# Patient Record
Sex: Female | Born: 1980
Health system: Southern US, Community
[De-identification: ages and names within clinical notes are randomized; demographics above are authoritative.]

## PROBLEM LIST (undated history)

## (undated) DIAGNOSIS — T148XXA Other injury of unspecified body region, initial encounter: Secondary | ICD-10-CM

## (undated) DIAGNOSIS — F1010 Alcohol abuse, uncomplicated: Secondary | ICD-10-CM

## (undated) DIAGNOSIS — G40909 Epilepsy, unspecified, not intractable, without status epilepticus: Secondary | ICD-10-CM

## (undated) DIAGNOSIS — F419 Anxiety disorder, unspecified: Secondary | ICD-10-CM

## (undated) HISTORY — DX: Alcohol abuse, uncomplicated: F10.10

## (undated) HISTORY — PX: CERVICAL CONE BIOPSY: SUR198

## (undated) HISTORY — DX: Other injury of unspecified body region, initial encounter: T14.8XXA

---
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
## 2002-07-28 ENCOUNTER — Emergency Department (HOSPITAL_COMMUNITY): Admission: EM | Admit: 2002-07-28 | Discharge: 2002-07-28 | Payer: Self-pay | Admitting: Emergency Medicine

## 2002-08-31 ENCOUNTER — Encounter: Admission: RE | Admit: 2002-08-31 | Discharge: 2002-08-31 | Payer: Self-pay | Admitting: Chiropractic Medicine

## 2002-08-31 ENCOUNTER — Encounter: Payer: Self-pay | Admitting: Chiropractic Medicine

## 2003-04-16 HISTORY — PX: GASTRIC BYPASS: SHX52

## 2011-05-15 LAB — HEPATIC FUNCTION PANEL
ALT: 41 U/L — AB (ref 7–35)
AST: 34 U/L (ref 13–35)

## 2011-05-15 LAB — BASIC METABOLIC PANEL
Creatinine: 0.6 mg/dL (ref 0.5–1.1)
Glucose: 80 mg/dL

## 2011-05-15 LAB — LIPID PANEL
Cholesterol: 158 mg/dL (ref 0–200)
HDL: 76 mg/dL — AB (ref 35–70)
LDL Cholesterol: 72 mg/dL
Triglycerides: 50 mg/dL (ref 40–160)

## 2011-05-15 LAB — TSH: TSH: 1.58 u[IU]/mL (ref <=5.90)

## 2011-10-21 DIAGNOSIS — I8390 Asymptomatic varicose veins of unspecified lower extremity: Secondary | ICD-10-CM | POA: Insufficient documentation

## 2012-02-28 LAB — IRON AND TIBC: Iron: 420

## 2012-02-28 LAB — IRON

## 2012-07-12 ENCOUNTER — Emergency Department
Admission: EM | Admit: 2012-07-12 | Discharge: 2012-07-12 | Disposition: A | Discharge status: Home / Self Care | Payer: Managed Care, Other (non HMO) | Source: Home / Self Care | Attending: Family Medicine | Admitting: Family Medicine

## 2012-07-12 DIAGNOSIS — J069 Acute upper respiratory infection, unspecified: Secondary | ICD-10-CM

## 2012-07-12 DIAGNOSIS — J029 Acute pharyngitis, unspecified: Secondary | ICD-10-CM

## 2012-07-12 DIAGNOSIS — M940 Chondrocostal junction syndrome [Tietze]: Secondary | ICD-10-CM

## 2012-07-12 HISTORY — DX: Anxiety disorder, unspecified: F41.9

## 2012-07-12 LAB — POCT RAPID STREP A (OFFICE): Rapid Strep A Screen: NEGATIVE

## 2012-07-12 MED ORDER — DOXYCYCLINE HYCLATE 100 MG PO CAPS: 100.0000 mg | ORAL_CAPSULE | Freq: Two times a day (BID) | ORAL | Status: DC

## 2012-07-12 MED ORDER — BENZONATATE 200 MG PO CAPS: 200.0000 mg | ORAL_CAPSULE | Freq: Every day | ORAL | Status: DC

## 2012-07-12 NOTE — ED Notes (Signed)
SUBJECTIVE:  Deanna Keller is a 32 y.o. female who complains of sore throat, swollen glands, cough and cough described as productive for 3 days. The cough started off as a dry cough and became productive yesterday. She denies a history of dizziness, nausea and shortness of breath and denies a history of asthma. Patient admits to smoke cigarettes half a pack for 5 years. She has not smoked for the last 3 days.

## 2012-07-12 NOTE — ED Provider Notes (Signed)
History     CSN: [id number]  Arrival date & time 07/12/12  1302   First MD Initiated Contact with Patient 07/12/12 1341      Chief Complaint  Patient presents with  . Cough    x 3 days  . Generalized Body Aches    x 3 days  . Sore Throat    x 3 days       HPI Comments: Shaundrea complains of onset of URI symptoms three days ago with fatigue and myalgias.  Two days ago she developed chills/sweats and fever to 99.5, and sore throat.  Last night she developed a nonproductive cough that has now become productive.  She has felt tightness in her anterior chest. She is a smoker.   The history is provided by the patient.    Past Medical History  Diagnosis Date  . Anxiety     History reviewed. No pertinent past surgical history.  History reviewed. No pertinent family history.  History  Substance Use Topics  . Smoking status: Current Every Day Smoker -- 0.50 packs/day for 5 years    Types: Cigarettes  . Smokeless tobacco: Never Used  . Alcohol Use: No    OB History   Grav Para Term Preterm Abortions TAB SAB Ect Mult Living                  Review of Systems + sore throat + cough No pleuritic pain No wheezing + nasal congestion + post-nasal drainage No sinus pain/pressure No itchy/red eyes ? earache No hemoptysis No SOB + fever, + chills No nausea No vomiting No abdominal pain No diarrhea No urinary symptoms No skin rashes + fatigue + myalgias No headache Used OTC meds without relief  Allergies  Review of patient's allergies indicates no known allergies.  Home Medications   Current Outpatient Rx  Name  Route  Sig  Dispense  Refill  . buPROPion (WELLBUTRIN SR) 150 MG 12 hr tablet   Oral   Take 150 mg by mouth 2 (two) times daily.         . busPIRone (BUSPAR) 10 MG tablet   Oral   Take 10 mg by mouth 3 (three) times daily.         . cyanocobalamin (,VITAMIN B-12,) 1000 MCG/ML injection   Intramuscular   Inject 1,000 mcg into the muscle every  30 (thirty) days.         [name] etonogestrel-ethinyl estradiol (NUVARING) 0.12-0.015 MG/24HR vaginal ring   Vaginal   Place 1 each vaginally every 28 (twenty-eight) days. Insert vaginally and leave in place for 3 consecutive weeks, then remove for 1 week.         . Prenatal Vit-Fe Psac Cmplx-FA (POLY IRON PN PO)   Oral   Take by mouth.         . benzonatate (TESSALON) 200 MG capsule   Oral   Take 1 capsule (200 mg total) by mouth at bedtime.   12 capsule   0   . doxycycline (VIBRAMYCIN) 100 MG capsule   Oral   Take 1 capsule (100 mg total) by mouth 2 (two) times daily. (Rx void after 07/19/12)   20 capsule   0     BP 119/47  Pulse 96  Temp(Src) 97.9 F (36.6 C) (Oral)  Ht 5\' 5"  (1.651 m)  Wt 177 lb (80.287 kg)  BMI 29.45 kg/m2  SpO2 96%  LMP 06/30/2012  Physical Exam Nursing notes and Vital Signs reviewed. Appearance:  Patient appears healthy, stated age, and in no acute distress Eyes:  Pupils are equal, round, and reactive to light and accomodation.  Extraocular movement is intact.  Conjunctivae are not inflamed  Ears:  Canals normal.  Tympanic membranes normal.  Nose:  Mildly congested turbinates.  No sinus tenderness.   Pharynx:  Normal Neck:  Supple.  Slightly tender shotty posterior nodes are palpated bilaterally  Lungs:  Clear to auscultation.  Breath sounds are equal. Chest:  Distinct tenderness to palpation over the mid-sternum.   Heart:  Regular rate and rhythm without murmurs, rubs, or gallops.  Abdomen:  Nontender without masses or hepatosplenomegaly.  Bowel sounds are present.  No CVA or flank tenderness.  Extremities:  No edema.  No calf tenderness Skin:  No rash present.   ED Course  Procedures  none  Labs Reviewed  POCT RAPID STREP A (OFFICE) - Normal      1. Sore throat   2. Acute upper respiratory infections of unspecified site   3. Costochondritis       MDM  There is no evidence of bacterial infection today.   Treat symptomatically  for now  Prescription written for Benzonatate (Tessalon) to take at bedtime for night-time cough.  Take Mucinex D (guaifenesin with decongestant) twice daily for congestion.  Increase fluid intake, rest. May use Afrin nasal spray (or generic oxymetazoline) twice daily for about 5 days.  Also recommend using saline nasal spray several times daily and saline nasal irrigation (AYR is a common brand) Stop all antihistamines for now, and other non-prescription cough/cold preparations. Try warm salt water gargles. May take Ibuprofen 200mg , 4 tabs every 8 hours with food for chest/sternum discomfort and sore throat. Begin Doxycycline if not improving about 1 week or if persistent fever develops (Given a prescription to hold, with an expiration date)  Follow-up with family doctor if not improving about10 days.        Lattie Haw, MD 07/13/12 [phone]

## 2012-09-04 LAB — VITAMIN B6: Vitamin B6: 37.7

## 2012-09-04 LAB — VITAMIN D 25 HYDROXY (VIT D DEFICIENCY, FRACTURES): Vit D, 25-Hydroxy: 38.5

## 2012-11-19 LAB — CBC WITH DIFFERENTIAL: MCV: 94.2 fL

## 2012-11-19 LAB — CBC AND DIFFERENTIAL
Hemoglobin: 13.9 g/dL (ref 12.0–16.0)
Platelets: 298 10*3/uL (ref 150–399)

## 2013-02-10 DIAGNOSIS — O99840 Bariatric surgery status complicating pregnancy, unspecified trimester: Secondary | ICD-10-CM | POA: Insufficient documentation

## 2013-04-12 DIAGNOSIS — K912 Postsurgical malabsorption, not elsewhere classified: Secondary | ICD-10-CM | POA: Insufficient documentation

## 2013-04-12 DIAGNOSIS — K902 Blind loop syndrome, not elsewhere classified: Secondary | ICD-10-CM | POA: Insufficient documentation

## 2013-04-16 LAB — VITAMIN B12: Vitamin B12 Bind Capacity: 709

## 2013-04-16 LAB — CBC AND DIFFERENTIAL
Hemoglobin: 13.9 g/dL (ref 12.0–16.0)
Platelets: 298 10*3/uL (ref 150–399)
WBC: 3.9 10^3/mL

## 2013-04-16 LAB — VITAMIN D 25 HYDROXY (VIT D DEFICIENCY, FRACTURES)
Vit D, 25-Hydroxy: 27
Vit D, 25-Hydroxy: 27

## 2013-04-16 LAB — HEPATIC FUNCTION PANEL
ALT: 48 U/L — AB (ref 7–35)
AST: 33 U/L (ref 13–35)

## 2013-04-16 LAB — VITAMIN B1, WHOLE BLOOD: Vitamin B1 (Thiamine), Blood: 143.2

## 2013-04-16 LAB — FERRITIN: Ferritin: 18.3

## 2013-04-16 LAB — FOLATE: Folate: >19.9

## 2013-04-16 LAB — BASIC METABOLIC PANEL
Creatinine: 0.5 mg/dL (ref 0.5–1.1)
Glucose: 83 mg/dL

## 2013-04-16 LAB — PTH, INTACT: PTH Interp: 54

## 2013-04-16 LAB — CALCIUM: Calcium: 9 mg/dL

## 2013-04-19 LAB — BASIC METABOLIC PANEL
BUN: 7 mg/dL (ref 4–21)
Creatinine: 0.5 mg/dL (ref 0.5–1.1)
Glucose: 83 mg/dL
Potassium: 4 mmol/L (ref 3.4–5.3)

## 2013-04-19 LAB — HEPATIC FUNCTION PANEL
ALT: 48 U/L — AB (ref 7–35)
AST: 33 U/L (ref 13–35)
Alkaline Phosphatase: 67 U/L (ref 25–125)

## 2013-04-19 LAB — COMPREHENSIVE METABOLIC PANEL
Albumin: 3.8
Calcium: 9 mg/dL

## 2013-04-19 LAB — PTH, INTACT: PTH Interp: 54

## 2013-07-19 ENCOUNTER — Ambulatory Visit: Payer: Managed Care, Other (non HMO) | Admitting: Family Medicine

## 2013-07-21 ENCOUNTER — Encounter: Payer: Self-pay | Admitting: Family Medicine

## 2013-07-21 ENCOUNTER — Ambulatory Visit (INDEPENDENT_AMBULATORY_CARE_PROVIDER_SITE_OTHER): Payer: BLUE CROSS/BLUE SHIELD | Admitting: Family Medicine

## 2013-07-21 VITALS — BP 117/67 | HR 90 | Ht 66.0 in | Wt 208.0 lb

## 2013-07-21 DIAGNOSIS — D509 Iron deficiency anemia, unspecified: Secondary | ICD-10-CM

## 2013-07-21 DIAGNOSIS — E538 Deficiency of other specified B group vitamins: Secondary | ICD-10-CM | POA: Insufficient documentation

## 2013-07-21 DIAGNOSIS — F1021 Alcohol dependence, in remission: Secondary | ICD-10-CM | POA: Insufficient documentation

## 2013-07-21 DIAGNOSIS — E611 Iron deficiency: Secondary | ICD-10-CM | POA: Insufficient documentation

## 2013-07-21 DIAGNOSIS — Z3009 Encounter for other general counseling and advice on contraception: Secondary | ICD-10-CM

## 2013-07-21 NOTE — Progress Notes (Signed)
CC: Deanna Keller is a 33 y.o. female is here for Establish Care   Subjective: HPI:  Very pleasant 33 year old here to establish care Boy due June 29th 2015.  Reports a history of iron deficiency anemia ever since gastric bypass surgery in early 2000s. She's required iron infusion once. She's taking an iron supplement twice a day with vitamin C via orange juice.  She is pregnant denies any new or worsening shortness of breath, fatigue, rapid heart beat nor irregular heartbeat.  History of B12 deficiency for which she is using B12 injections on a monthly basis. Denies any motor or sensory disturbances, confusion, memory loss. She's been getting labs 4 iron and B12 studies with her bariatrics clinic most recent values obtained today  She would like to discuss the process of finding a pediatrician for her child who is expected to be born in late June. She is under the impression that she has to [facility] before she will be admitted to be allowed to deliver.  Review of Systems - General ROS: negative for - chills, fever, night sweats, weight gain or weight loss Ophthalmic ROS: negative for - decreased vision Psychological ROS: negative for - anxiety or depression ENT ROS: negative for - hearing change, nasal congestion, tinnitus or allergies Hematological and Lymphatic ROS: negative for - bleeding problems, bruising or swollen lymph nodes Breast ROS: negative Respiratory ROS: no cough, shortness of breath, or wheezing Cardiovascular ROS: no chest pain or dyspnea on exertion Gastrointestinal ROS: no abdominal pain, change in bowel habits, or black or bloody stools Genito-Urinary ROS: negative for - genital discharge, genital ulcers, incontinence or abnormal bleeding from genitals Musculoskeletal ROS: negative for - joint pain or muscle pain Neurological ROS: negative for - headaches or memory loss Dermatological ROS: negative for lumps,  mole changes, rash and skin lesion changes  Past Medical History  Diagnosis Date  . Anxiety   . Alcohol abuse     sober since 2011    Past Surgical History  Procedure Laterality Date  . Gastric bypass  2005   Family History  Problem Relation Age of Onset  . Colon cancer Mother   . Depression Mother   . Hypertension Father     History   Social History  . Marital Status: Married    Spouse Name: N/A    Number of Children: N/A  . Years of Education: N/A   Occupational History  . Not on file.   Social History Main Topics  . Smoking status: Former Smoker -- 0.50 packs/day for 5 years    Types: Cigarettes    Start date: 07/21/2012  . Smokeless tobacco: Never Used  . Alcohol Use: No  . Drug Use: No  . Sexual Activity: Yes    Partners: Male   Other Topics Concern  . Not on file   Social History Narrative  . No narrative on file     Objective: BP 117/67  Pulse 90  Ht 5\' 6"  (1.676 m)  Wt 208 lb (94.348 kg)  BMI 33.59 kg/m2  Vital signs reviewed. General: Alert and Oriented, No Acute Distress HEENT: Pupils equal, round, reactive to light. Conjunctivae clear.  External ears unremarkable.  Moist mucous membranes. Lungs: Clear and comfortable work of breathing, speaking in full sentences without accessory muscle use. Cardiac: Regular rate and rhythm.  Neuro: CN II-XII grossly intact, gait normal. Extremities: No peripheral edema.  Strong peripheral pulses.  Mental Status: No depression, anxiety, nor  agitation. Logical though process. Skin: Warm and dry.  Assessment & Plan: Terressa was seen today for establish care.  Diagnoses and associated orders for this visit:  B12 deficiency  Iron deficiency  History of alcoholism  Family planning counseling    B12 deficiency: Clinically controlled, I have access to her outside labs and we'll review this once available tomorrow Iron deficiency: Clinically controlled, again I will be labs once available in care  everywhere tomorrow to provide any necessary recommendations History of alcoholism: Congratulated her 3 years of sobriety Family planning: Majority of her visit today was spent discussing the process of finding a pediatrician and what specialty is will be managing her and her child from the point she enters the hospital in labor until they are hopefully discharged to-3 days later after an unremarkable hospital stay.  Discussed that I would be more than happy to help with the care of their newborn however they would need to be seen by the teaching service or any other private pediatrics group during the first days of life in the newborn nursery.   30 minutes spent face-to-face during visit today of which at least 50% was counseling or coordinating care regarding: 1. B12 deficiency   2. Iron deficiency   3. History of alcoholism   4. Family planning counseling       Return if symptoms worsen or fail to improve.

## 2013-07-22 ENCOUNTER — Encounter: Payer: Self-pay | Admitting: Family Medicine

## 2013-10-06 ENCOUNTER — Encounter: Payer: Self-pay | Admitting: Family Medicine

## 2013-10-08 ENCOUNTER — Telehealth: Payer: Self-pay | Admitting: Family Medicine

## 2013-10-08 DIAGNOSIS — R102 Pelvic and perineal pain: Secondary | ICD-10-CM

## 2013-10-08 MED ORDER — DOCUSATE SODIUM 100 MG PO CAPS: 100.0000 mg | ORAL_CAPSULE | Freq: Two times a day (BID) | ORAL | Status: DC

## 2013-10-08 MED ORDER — HYDROCODONE-ACETAMINOPHEN 10-325 MG PO TABS: 1.0000 | ORAL_TABLET | Freq: Three times a day (TID) | ORAL | Status: DC | PRN

## 2013-10-08 NOTE — Telephone Encounter (Signed)
I was seen in this patient's newborn son she mentions that she's having vaginal pain with defecation, she was not given Colace when she was discharged.  The pain is slightly improved with ibuprofen and she's run out of hydrocodone at home. Refills were provided of hydrocodone and I encouraged her not to feed her child any breast milk within a six-hour window from the time that she takes the medication beyond.

## 2013-10-11 ENCOUNTER — Ambulatory Visit (INDEPENDENT_AMBULATORY_CARE_PROVIDER_SITE_OTHER): Payer: BC Managed Care – PPO | Admitting: Family Medicine

## 2013-10-11 ENCOUNTER — Encounter: Payer: Self-pay | Admitting: Family Medicine

## 2013-10-11 VITALS — BP 112/74 | HR 72 | Temp 97.6°F | Wt 206.0 lb

## 2013-10-11 DIAGNOSIS — R102 Pelvic and perineal pain: Secondary | ICD-10-CM

## 2013-10-11 DIAGNOSIS — N949 Unspecified condition associated with female genital organs and menstrual cycle: Secondary | ICD-10-CM

## 2013-10-11 DIAGNOSIS — N61 Mastitis without abscess: Secondary | ICD-10-CM

## 2013-10-11 MED ORDER — CLINDAMYCIN HCL 300 MG PO CAPS: 300.0000 mg | ORAL_CAPSULE | Freq: Three times a day (TID) | ORAL | Status: DC

## 2013-10-11 MED ORDER — DICLOFENAC SODIUM 50 MG PO TBEC: DELAYED_RELEASE_TABLET | ORAL | Status: DC

## 2013-10-11 MED ORDER — HYDROCODONE-ACETAMINOPHEN 10-325 MG PO TABS: 0.5000 | ORAL_TABLET | ORAL | Status: DC | PRN

## 2013-10-11 NOTE — Progress Notes (Signed)
CC: Deanna Keller Keller is a 33 y.o. female is here for clogged milk ducts?   Subjective: HPI:  Signs of bilateral breast pain described as diffuse, burning sensation, fullness sensation, moderate in severity worse with touch. Has been worsening over the past 2 days.  Slightly improved with ibuprofen. She's coming every few hours however only producing less than half an ounce of milk when both sides are combined. She had subjective fevers yesterday and since has not had any chills or night sweats.  She denies any change in the appearance or discharge from the breasts. She denies any recent or remote trauma.  Denies chest pain, shortness of breath, abdominal pain.  Complains of continued vaginal pain with improving vaginal spotting/bleeding. Symptoms are improved with hydrocodone that was given to her at last visit however she states that symptoms returned around 6 or 7 hours after she's taken a dose. Is still described as a pinching sensation worse with Valsalva, slightly improved bowel movements are easier to pass. Denies any vaginal discharge, dysuria, nor flank pain.   Review Of Systems Outlined In HPI  Past Medical History  Diagnosis Date  . Anxiety   . Alcohol abuse     sober since 2011    Past Surgical History  Procedure Laterality Date  . Gastric bypass  2005   Family History  Problem Relation Age of Onset  . Colon cancer Mother   . Depression Mother   . Hypertension Father     History   Social History  . Marital Status: Married    Spouse Name: N/A    Number of Children: N/A  . Years of Education: N/A   Occupational History  . Not on file.   Social History Main Topics  . Smoking status: Former Smoker -- 0.50 packs/day for 5 years    Types: Cigarettes    Start date: 07/21/2012  . Smokeless tobacco: Never Used  . Alcohol Use: No  . Drug Use: No  . Sexual Activity: Yes    Partners: Male   Other Topics Concern  . Not on file   Social History Narrative  . No  narrative on file     Objective: BP 112/74  Pulse 72  Temp(Src) 97.6 F (36.4 C) (Oral)  Wt 206 lb (93.441 kg)  General: Alert and Oriented, No Acute Distress HEENT: Pupils equal, round, reactive to light. Conjunctivae clear.  External ears unremarkable, canals clear with intact TMs with moist membranes pharynx unremarkable Lungs: Clear to auscultation bilaterally, no wheezing/ronchi/rales.  Comfortable work of breathing. Good air movement. Cardiac: Regular rate and rhythm. Normal S1/S2.  No murmurs, rubs, nor gallops.   Abdomen: soft nontender Breasts: Patient's mother acted as a Biomedical engineerchaperone during this exam - both breasts show no gross architectural changes, no nipple discharge bilaterally, on both breasts extending approximately 2.5 cm peripherally from the area with there is erythema and warmth. Extremities No peripheral edema.  Strong peripheral pulses.  Mental Status: No depression, anxiety, nor agitation. Skin: Warm and dry.  Assessment & Plan: Deanna Keller was seen today for clogged milk ducts?.  Diagnoses and associated orders for this visit:  Vaginal pain - HYDROcodone-acetaminophen (NORCO) 10-325 MG per tablet; Take 0.5-1 tablets by mouth every 4 (four) hours as needed.  Mastitis - HYDROcodone-acetaminophen (NORCO) 10-325 MG per tablet; Take 0.5-1 tablets by mouth every 4 (four) hours as needed. - clindamycin (CLEOCIN) 300 MG capsule; Take 1 capsule (300 mg total) by mouth 3 (three) times daily. - diclofenac (VOLTAREN) 50 MG EC  tablet; Take one tablet every 8 hours only as needed for pain, take with small snack.    Vaginal pain: Now she's no longer breast-feeding she can increase the frequency of the hydrocodone, I also like her to start diclofenac instead of ibuprofen. Mastitis: Start clindamycin, apply warm compresses frequently through the day.Signs and symptoms requring emergent/urgent reevaluation were discussed with the patient.  Return in about 4 weeks (around  11/08/2013).

## 2013-10-12 ENCOUNTER — Telehealth: Payer: Self-pay | Admitting: *Deleted

## 2013-10-12 ENCOUNTER — Encounter: Payer: Self-pay | Admitting: Family Medicine

## 2013-10-12 NOTE — Telephone Encounter (Signed)
Mom wanted to know if she is supposed to take diclofenac along with the motrin. Advised that the diclofenac is more for swelling and that is to take the place of the motrin. Advised she can take diclofenac along with hydrocodone as needed. Mom voiced understanding

## 2013-10-18 ENCOUNTER — Encounter: Payer: Self-pay | Admitting: Family Medicine

## 2013-10-18 ENCOUNTER — Other Ambulatory Visit: Payer: Self-pay | Admitting: Family Medicine

## 2013-10-19 ENCOUNTER — Telehealth: Payer: Self-pay | Admitting: Family Medicine

## 2013-10-19 DIAGNOSIS — N61 Mastitis without abscess: Secondary | ICD-10-CM

## 2013-10-19 DIAGNOSIS — R102 Pelvic and perineal pain: Secondary | ICD-10-CM

## 2013-10-19 DIAGNOSIS — M545 Low back pain, unspecified: Secondary | ICD-10-CM

## 2013-10-19 MED ORDER — HYDROCODONE-ACETAMINOPHEN 10-325 MG PO TABS: 0.5000 | ORAL_TABLET | ORAL | Status: DC | PRN

## 2013-10-19 MED ORDER — NAPROXEN 500 MG PO TABS
250.0000 mg | ORAL_TABLET | Freq: Two times a day (BID) | ORAL | Status: DC
Start: 2013-10-19 — End: 2013-10-28

## 2013-10-19 NOTE — Telephone Encounter (Signed)
Sue Lushndrea, Rx in your inbox ready for pickup/fax.  Will you please let patient know that I'd advise against taking the hydrocodone more frequently in order to minimize building a tolerance to it.  I'd recommend trying a different antiinflammatory therefore stop diclofenac and start new Rx of naproxen.  If no improvement with this switch follow up in office to get and review an xray.    ----- Message ----- From: Orson GearBlair Alsip Sent: 10/18/2013 5:12 PM To: [facility] Clinical Pool Subject: RE: Non-Urgent Medical Question I feel it helps more when I take both together, vs. pain med on its own. Could I take pain meds more frequent to see if there's a change? Like every 2 hours?

## 2013-10-19 NOTE — Telephone Encounter (Signed)
Pt notified and rx up front 

## 2013-10-21 ENCOUNTER — Encounter: Payer: Self-pay | Admitting: Family Medicine

## 2013-10-25 ENCOUNTER — Encounter: Payer: Self-pay | Admitting: Family Medicine

## 2013-10-28 ENCOUNTER — Ambulatory Visit (INDEPENDENT_AMBULATORY_CARE_PROVIDER_SITE_OTHER): Payer: BC Managed Care – PPO | Admitting: Physician Assistant

## 2013-10-28 ENCOUNTER — Encounter: Payer: Self-pay | Admitting: Physician Assistant

## 2013-10-28 VITALS — BP 117/85 | HR 83 | Ht 66.0 in | Wt 200.0 lb

## 2013-10-28 DIAGNOSIS — M5430 Sciatica, unspecified side: Secondary | ICD-10-CM

## 2013-10-28 DIAGNOSIS — M545 Low back pain, unspecified: Secondary | ICD-10-CM

## 2013-10-28 DIAGNOSIS — M5440 Lumbago with sciatica, unspecified side: Secondary | ICD-10-CM

## 2013-10-28 MED ORDER — PREDNISONE 50 MG PO TABS: ORAL_TABLET | ORAL | Status: DC

## 2013-10-28 MED ORDER — CYCLOBENZAPRINE HCL 10 MG PO TABS: 10.0000 mg | ORAL_TABLET | Freq: Three times a day (TID) | ORAL | Status: DC | PRN

## 2013-10-28 MED ORDER — KETOROLAC TROMETHAMINE 60 MG/2ML IM SOLN
60.0000 mg | Freq: Once | INTRAMUSCULAR | Status: AC
  Administered 2013-10-28: 60 mg via INTRAMUSCULAR

## 2013-10-28 MED ORDER — NAPROXEN 500 MG PO TABS: 250.0000 mg | ORAL_TABLET | Freq: Two times a day (BID) | ORAL | Status: DC

## 2013-10-28 MED ORDER — HYDROCODONE-ACETAMINOPHEN 10-325 MG PO TABS: 0.5000 | ORAL_TABLET | Freq: Four times a day (QID) | ORAL | Status: DC | PRN

## 2013-10-28 NOTE — Patient Instructions (Signed)
Sciatica with Rehab The sciatic nerve runs from the back down the leg and is responsible for sensation and control of the muscles in the back (posterior) side of the thigh, lower leg, and foot. Sciatica is a condition that is characterized by inflammation of this nerve.  SYMPTOMS   Signs of nerve damage, including numbness and/or weakness along the posterior side of the lower extremity.  Pain in the back of the thigh that may also travel down the leg.  Pain that worsens when sitting for long periods of time.  Occasionally, pain in the back or buttock. CAUSES  Inflammation of the sciatic nerve is the cause of sciatica. The inflammation is due to something irritating the nerve. Common sources of irritation include:  Sitting for long periods of time.  Direct trauma to the nerve.  Arthritis of the spine.  Herniated or ruptured disk.  Slipping of the vertebrae (spondylolithesis)  Pressure from soft tissues, such as muscles or ligament-like tissue (fascia). RISK INCREASES WITH:  Sports that place pressure or stress on the spine (football or weightlifting).  Poor strength and flexibility.  Failure to warm-up properly before activity.  Family history of low back pain or disk disorders.  Previous back injury or surgery.  Poor body mechanics, especially when lifting, or poor posture. PREVENTION   Warm up and stretch properly before activity.  Maintain physical fitness:  Strength, flexibility, and endurance.  Cardiovascular fitness.  Learn and use proper technique, especially with posture and lifting. When possible, have coach correct improper technique.  Avoid activities that place stress on the spine. PROGNOSIS If treated properly, then sciatica usually resolves within 6 weeks. However, occasionally surgery is necessary.  RELATED COMPLICATIONS   Permanent nerve damage, including pain, numbness, tingle, or weakness.  Chronic back pain.  Risks of surgery: infection,  bleeding, nerve damage, or damage to surrounding tissues. TREATMENT Treatment initially involves resting from any activities that aggravate your symptoms. The use of ice and medication may help reduce pain and inflammation. The use of strengthening and stretching exercises may help reduce pain with activity. These exercises may be performed at home or with referral to a therapist. A therapist may recommend further treatments, such as transcutaneous electronic nerve stimulation (TENS) or ultrasound. Your caregiver may recommend corticosteroid injections to help reduce inflammation of the sciatic nerve. If symptoms persist despite non-surgical (conservative) treatment, then surgery may be recommended. MEDICATION  If pain medication is necessary, then nonsteroidal anti-inflammatory medications, such as aspirin and ibuprofen, or other minor pain relievers, such as acetaminophen, are often recommended.  Do not take pain medication for 7 days before surgery.  Prescription pain relievers may be given if deemed necessary by your caregiver. Use only as directed and only as much as you need.  Ointments applied to the skin may be helpful.  Corticosteroid injections may be given by your caregiver. These injections should be reserved for the most serious cases, because they may only be given a certain number of times. HEAT AND COLD  Cold treatment (icing) relieves pain and reduces inflammation. Cold treatment should be applied for 10 to 15 minutes every 2 to 3 hours for inflammation and pain and immediately after any activity that aggravates your symptoms. Use ice packs or massage the area with a piece of ice (ice massage).  Heat treatment may be used prior to performing the stretching and strengthening activities prescribed by your caregiver, physical therapist, or athletic trainer. Use a heat pack or soak the injury in warm water. SEEK   MEDICAL CARE IF:  Treatment seems to offer no benefit, or the condition  worsens.  Any medications produce adverse side effects. EXERCISES  RANGE OF MOTION (ROM) AND STRETCHING EXERCISES - Sciatica Most people with sciatic will find that their symptoms worsen with either excessive bending forward (flexion) or arching at the low back (extension). The exercises which will help resolve your symptoms will focus on the opposite motion. Your physician, physical therapist or athletic trainer will help you determine which exercises will be most helpful to resolve your low back pain. Do not complete any exercises without first consulting with your clinician. Discontinue any exercises which worsen your symptoms until you speak to your clinician. If you have pain, numbness or tingling which travels down into your buttocks, leg or foot, the goal of the therapy is for these symptoms to move closer to your back and eventually resolve. Occasionally, these leg symptoms will get better, but your low back pain may worsen; this is typically an indication of progress in your rehabilitation. Be certain to be very alert to any changes in your symptoms and the activities in which you participated in the 24 hours prior to the change. Sharing this information with your clinician will allow him/her to most efficiently treat your condition. These exercises may help you when beginning to rehabilitate your injury. Your symptoms may resolve with or without further involvement from your physician, physical therapist or athletic trainer. While completing these exercises, remember:   Restoring tissue flexibility helps normal motion to return to the joints. This allows healthier, less painful movement and activity.  An effective stretch should be held for at least 30 seconds.  A stretch should never be painful. You should only feel a gentle lengthening or release in the stretched tissue. FLEXION RANGE OF MOTION AND STRETCHING EXERCISES: STRETCH - Flexion, Single Knee to Chest   Lie on a firm bed or floor  with both legs extended in front of you.  Keeping one leg in contact with the floor, bring your opposite knee to your chest. Hold your leg in place by either grabbing behind your thigh or at your knee.  Pull until you feel a gentle stretch in your low back. Hold __________ seconds.  Slowly release your grasp and repeat the exercise with the opposite side. Repeat __________ times. Complete this exercise __________ times per day.  STRETCH - Flexion, Double Knee to Chest  Lie on a firm bed or floor with both legs extended in front of you.  Keeping one leg in contact with the floor, bring your opposite knee to your chest.  Tense your stomach muscles to support your back and then lift your other knee to your chest. Hold your legs in place by either grabbing behind your thighs or at your knees.  Pull both knees toward your chest until you feel a gentle stretch in your low back. Hold __________ seconds.  Tense your stomach muscles and slowly return one leg at a time to the floor. Repeat __________ times. Complete this exercise __________ times per day.  STRETCH - Low Trunk Rotation   Lie on a firm bed or floor. Keeping your legs in front of you, bend your knees so they are both pointed toward the ceiling and your feet are flat on the floor.  Extend your arms out to the side. This will stabilize your upper body by keeping your shoulders in contact with the floor.  Gently and slowly drop both knees together to one side until you   feel a gentle stretch in your low back. Hold for __________ seconds.  Tense your stomach muscles to support your low back as you bring your knees back to the starting position. Repeat the exercise to the other side. Repeat __________ times. Complete this exercise __________ times per day  EXTENSION RANGE OF MOTION AND FLEXIBILITY EXERCISES: STRETCH - Extension, Prone on Elbows  Lie on your stomach on the floor, a bed will be too soft. Place your palms about shoulder  width apart and at the height of your head.  Place your elbows under your shoulders. If this is too painful, stack pillows under your chest.  Allow your body to relax so that your hips drop lower and make contact more completely with the floor.  Hold this position for __________ seconds.  Slowly return to lying flat on the floor. Repeat __________ times. Complete this exercise __________ times per day.  RANGE OF MOTION - Extension, Prone Press Ups  Lie on your stomach on the floor, a bed will be too soft. Place your palms about shoulder width apart and at the height of your head.  Keeping your back as relaxed as possible, slowly straighten your elbows while keeping your hips on the floor. You may adjust the placement of your hands to maximize your comfort. As you gain motion, your hands will come more underneath your shoulders.  Hold this position __________ seconds.  Slowly return to lying flat on the floor. Repeat __________ times. Complete this exercise __________ times per day.  STRENGTHENING EXERCISES - Sciatica  These exercises may help you when beginning to rehabilitate your injury. These exercises should be done near your "sweet spot." This is the neutral, low-back arch, somewhere between fully rounded and fully arched, that is your least painful position. When performed in this safe range of motion, these exercises can be used for people who have either a flexion or extension based injury. These exercises may resolve your symptoms with or without further involvement from your physician, physical therapist or athletic trainer. While completing these exercises, remember:   Muscles can gain both the endurance and the strength needed for everyday activities through controlled exercises.  Complete these exercises as instructed by your physician, physical therapist or athletic trainer. Progress with the resistance and repetition exercises only as your caregiver advises.  You may  experience muscle soreness or fatigue, but the pain or discomfort you are trying to eliminate should never worsen during these exercises. If this pain does worsen, stop and make certain you are following the directions exactly. If the pain is still present after adjustments, discontinue the exercise until you can discuss the trouble with your clinician. STRENGTHENING - Deep Abdominals, Pelvic Tilt   Lie on a firm bed or floor. Keeping your legs in front of you, bend your knees so they are both pointed toward the ceiling and your feet are flat on the floor.  Tense your lower abdominal muscles to press your low back into the floor. This motion will rotate your pelvis so that your tail bone is scooping upwards rather than pointing at your feet or into the floor.  With a gentle tension and even breathing, hold this position for __________ seconds. Repeat __________ times. Complete this exercise __________ times per day.  STRENGTHENING - Abdominals, Crunches   Lie on a firm bed or floor. Keeping your legs in front of you, bend your knees so they are both pointed toward the ceiling and your feet are flat on the floor.   Cross your arms over your chest.  Slightly tip your chin down without bending your neck.  Tense your abdominals and slowly lift your trunk high enough to just clear your shoulder blades. Lifting higher can put excessive stress on the low back and does not further strengthen your abdominal muscles.  Control your return to the starting position. Repeat __________ times. Complete this exercise __________ times per day.  STRENGTHENING - Quadruped, Opposite UE/LE Lift  Assume a hands and knees position on a firm surface. Keep your hands under your shoulders and your knees under your hips. You may place padding under your knees for comfort.  Find your neutral spine and gently tense your abdominal muscles so that you can maintain this position. Your shoulders and hips should form a rectangle  that is parallel with the floor and is not twisted.  Keeping your trunk steady, lift your right hand no higher than your shoulder and then your left leg no higher than your hip. Make sure you are not holding your breath. Hold this position __________ seconds.  Continuing to keep your abdominal muscles tense and your back steady, slowly return to your starting position. Repeat with the opposite arm and leg. Repeat __________ times. Complete this exercise __________ times per day.  STRENGTHENING - Abdominals and Quadriceps, Straight Leg Raise   Lie on a firm bed or floor with both legs extended in front of you.  Keeping one leg in contact with the floor, bend the other knee so that your foot can rest flat on the floor.  Find your neutral spine, and tense your abdominal muscles to maintain your spinal position throughout the exercise.  Slowly lift your straight leg off the floor about 6 inches for a count of 15, making sure to not hold your breath.  Still keeping your neutral spine, slowly lower your leg all the way to the floor. Repeat this exercise with each leg __________ times. Complete this exercise __________ times per day. POSTURE AND BODY MECHANICS CONSIDERATIONS - Sciatica Keeping correct posture when sitting, standing or completing your activities will reduce the stress put on different body tissues, allowing injured tissues a chance to heal and limiting painful experiences. The following are general guidelines for improved posture. Your physician or physical therapist will provide you with any instructions specific to your needs. While reading these guidelines, remember:  The exercises prescribed by your provider will help you have the flexibility and strength to maintain correct postures.  The correct posture provides the optimal environment for your joints to work. All of your joints have less wear and tear when properly supported by a spine with good posture. This means you will  experience a healthier, less painful body.  Correct posture must be practiced with all of your activities, especially prolonged sitting and standing. Correct posture is as important when doing repetitive low-stress activities (typing) as it is when doing a single heavy-load activity (lifting). RESTING POSITIONS Consider which positions are most painful for you when choosing a resting position. If you have pain with flexion-based activities (sitting, bending, stooping, squatting), choose a position that allows you to rest in a less flexed posture. You would want to avoid curling into a fetal position on your side. If your pain worsens with extension-based activities (prolonged standing, working overhead), avoid resting in an extended position such as sleeping on your stomach. Most people will find more comfort when they rest with their spine in a more neutral position, neither too rounded nor too arched.   Lying on a non-sagging bed on your side with a pillow between your knees, or on your back with a pillow under your knees will often provide some relief. Keep in mind, being in any one position for a prolonged period of time, no matter how correct your posture, can still lead to stiffness. PROPER SITTING POSTURE In order to minimize stress and discomfort on your spine, you must sit with correct posture Sitting with good posture should be effortless for a healthy body. Returning to good posture is a gradual process. Many people can work toward this most comfortably by using various supports until they have the flexibility and strength to maintain this posture on their own. When sitting with proper posture, your ears will fall over your shoulders and your shoulders will fall over your hips. You should use the back of the chair to support your upper back. Your low back will be in a neutral position, just slightly arched. You may place a small pillow or folded towel at the base of your low back for support.  When  working at a desk, create an environment that supports good, upright posture. Without extra support, muscles fatigue and lead to excessive strain on joints and other tissues. Keep these recommendations in mind: CHAIR:   A chair should be able to slide under your desk when your back makes contact with the back of the chair. This allows you to work closely.  The chair's height should allow your eyes to be level with the upper part of your monitor and your hands to be slightly lower than your elbows. BODY POSITION  Your feet should make contact with the floor. If this is not possible, use a foot rest.  Keep your ears over your shoulders. This will reduce stress on your neck and low back. INCORRECT SITTING POSTURES   If you are feeling tired and unable to assume a healthy sitting posture, do not slouch or slump. This puts excessive strain on your back tissues, causing more damage and pain. Healthier options include:  Using more support, like a lumbar pillow.  Switching tasks to something that requires you to be upright or walking.  Talking a brief walk.  Lying down to rest in a neutral-spine position. PROLONGED STANDING WHILE SLIGHTLY LEANING FORWARD  When completing a task that requires you to lean forward while standing in one place for a long time, place either foot up on a stationary 2-4 inch high object to help maintain the best posture. When both feet are on the ground, the low back tends to lose its slight inward curve. If this curve flattens (or becomes too large), then the back and your other joints will experience too much stress, fatigue more quickly and can cause pain.  CORRECT STANDING POSTURES Proper standing posture should be assumed with all daily activities, even if they only take a few moments, like when brushing your teeth. As in sitting, your ears should fall over your shoulders and your shoulders should fall over your hips. You should keep a slight tension in your abdominal  muscles to brace your spine. Your tailbone should point down to the ground, not behind your body, resulting in an over-extended swayback posture.  INCORRECT STANDING POSTURES  Common incorrect standing postures include a forward head, locked knees and/or an excessive swayback. WALKING Walk with an upright posture. Your ears, shoulders and hips should all line-up. PROLONGED ACTIVITY IN A FLEXED POSITION When completing a task that requires you to bend forward at   your waist or lean over a low surface, try to find a way to stabilize 3 of 4 of your limbs. You can place a hand or elbow on your thigh or rest a knee on the surface you are reaching across. This will provide you more stability so that your muscles do not fatigue as quickly. By keeping your knees relaxed, or slightly bent, you will also reduce stress across your low back. CORRECT LIFTING TECHNIQUES DO :   Assume a wide stance. This will provide you more stability and the opportunity to get as close as possible to the object which you are lifting.  Tense your abdominals to brace your spine; then bend at the knees and hips. Keeping your back locked in a neutral-spine position, lift using your leg muscles. Lift with your legs, keeping your back straight.  Test the weight of unknown objects before attempting to lift them.  Try to keep your elbows locked down at your sides in order get the best strength from your shoulders when carrying an object.  Always ask for help when lifting heavy or awkward objects. INCORRECT LIFTING TECHNIQUES DO NOT:   Lock your knees when lifting, even if it is a small object.  Bend and twist. Pivot at your feet or move your feet when needing to change directions.  Assume that you cannot safely pick up a paperclip without proper posture. Document Released: 04/01/2005 Document Revised: 06/24/2011 Document Reviewed: 07/14/2008 [employer] Patient Information 2015 [employer]. This information is not intended to  replace advice given to you by your health care provider. Make sure you discuss any questions you have with your health care provider.  

## 2013-10-28 NOTE — Progress Notes (Signed)
   Subjective:    Patient ID: Deanna Keller, female    DOB: 09-19-1980, 33 y.o.   MRN: [id number]  HPI Pt is a 33 yo female who presents to the clinic with low back pain around the area where she received epidural. She delievered on 10/03/13 and since had mastitis and vaginal pain. She saw Dr. Ivan AnchorsHommel and both have improved. She since has developed back pain that has started to radiate down the back of both knees. Sitting makes worse. Laying flat makes the best. Has looked up scaitica exercises online and started them. norco does help. Taking naprosyn and does not seem to be helping as much. Denies any saddle anthesia or bladder/bowel dysfunction.    Review of Systems  All other systems reviewed and are negative.      Objective:   Physical Exam  Constitutional: She is oriented to person, place, and time. She appears well-developed and well-nourished.  HENT:  Head: Normocephalic and atraumatic.  Cardiovascular: Normal rate, regular rhythm and normal heart sounds.   Pulmonary/Chest: Effort normal and breath sounds normal.  Musculoskeletal:  ROM at waist limited due to pain.  Discomfort over lumbar spine to palpation. Tenderness and tightness around paraspinous muscles of the lumbar region.  Pain in left lower back with straight leg test.  Patellar reflexes 2+ bilaterally.  ROM at bilateral hips NROM without pain.    Neurological: She is alert and oriented to person, place, and time.  Skin: Skin is dry.  Psychiatric: She has a normal mood and affect. Her behavior is normal.          Assessment & Plan:  Low back pain with sciatica- pain has changed to symptoms consistent with sciaticia. Shot of toradol 60mg  IM today. Short burst of prednisone given today. Not breastfeeding. Flexeril given to use up to TID. Warned of sedation. Continue naprosyn bid. norco given to use prn for pain. Exercises given to strengthen lower back. Encouraged heat for relaxation. Follow up with pcp in 2 weeks if  symptoms not significantly improving.

## 2013-11-03 ENCOUNTER — Telehealth: Payer: Self-pay | Admitting: Family Medicine

## 2013-11-03 ENCOUNTER — Encounter: Payer: Self-pay | Admitting: Family Medicine

## 2013-11-03 MED ORDER — CYCLOBENZAPRINE HCL 10 MG PO TABS: 10.0000 mg | ORAL_TABLET | Freq: Three times a day (TID) | ORAL | Status: DC | PRN

## 2013-11-03 NOTE — Telephone Encounter (Signed)
Refill

## 2013-11-04 ENCOUNTER — Encounter: Payer: Self-pay | Admitting: Family Medicine

## 2013-11-08 MED ORDER — CITALOPRAM HYDROBROMIDE 20 MG PO TABS: 20.0000 mg | ORAL_TABLET | Freq: Every day | ORAL | Status: DC

## 2013-11-10 ENCOUNTER — Encounter: Payer: Self-pay | Admitting: Family Medicine

## 2013-11-10 DIAGNOSIS — K909 Intestinal malabsorption, unspecified: Secondary | ICD-10-CM | POA: Insufficient documentation

## 2013-11-10 DIAGNOSIS — R7989 Other specified abnormal findings of blood chemistry: Secondary | ICD-10-CM | POA: Insufficient documentation

## 2013-11-10 DIAGNOSIS — F329 Major depressive disorder, single episode, unspecified: Secondary | ICD-10-CM | POA: Insufficient documentation

## 2013-11-10 DIAGNOSIS — F32A Depression, unspecified: Secondary | ICD-10-CM | POA: Insufficient documentation

## 2013-11-10 DIAGNOSIS — IMO0002 Reserved for concepts with insufficient information to code with codable children: Secondary | ICD-10-CM | POA: Insufficient documentation

## 2013-11-10 DIAGNOSIS — G43709 Chronic migraine without aura, not intractable, without status migrainosus: Secondary | ICD-10-CM | POA: Insufficient documentation

## 2013-11-10 DIAGNOSIS — R945 Abnormal results of liver function studies: Secondary | ICD-10-CM

## 2013-11-11 ENCOUNTER — Encounter: Payer: Self-pay | Admitting: Family Medicine

## 2013-11-11 DIAGNOSIS — Z9884 Bariatric surgery status: Secondary | ICD-10-CM | POA: Insufficient documentation

## 2013-11-11 DIAGNOSIS — E559 Vitamin D deficiency, unspecified: Secondary | ICD-10-CM | POA: Insufficient documentation

## 2014-01-24 ENCOUNTER — Other Ambulatory Visit: Payer: Self-pay | Admitting: Physician Assistant

## 2014-02-21 ENCOUNTER — Other Ambulatory Visit: Payer: Self-pay | Admitting: Family Medicine

## 2014-02-21 ENCOUNTER — Other Ambulatory Visit: Payer: Self-pay | Admitting: Physician Assistant

## 2014-03-03 ENCOUNTER — Encounter: Payer: Self-pay | Admitting: Family Medicine

## 2014-03-08 ENCOUNTER — Other Ambulatory Visit: Payer: Self-pay | Admitting: Family Medicine

## 2014-03-08 ENCOUNTER — Encounter: Payer: Self-pay | Admitting: Family Medicine

## 2014-03-16 ENCOUNTER — Telehealth: Payer: Self-pay | Admitting: Family Medicine

## 2014-03-16 MED ORDER — CYANOCOBALAMIN 1000 MCG/ML IJ SOLN: INTRAMUSCULAR | Status: DC

## 2014-03-16 NOTE — Telephone Encounter (Signed)
Refill req 

## 2014-04-09 ENCOUNTER — Encounter: Payer: Self-pay | Admitting: Family Medicine

## 2014-04-13 ENCOUNTER — Other Ambulatory Visit: Payer: Self-pay | Admitting: Physician Assistant

## 2014-04-21 ENCOUNTER — Telehealth: Payer: Self-pay | Admitting: *Deleted

## 2014-04-21 ENCOUNTER — Encounter: Payer: Self-pay | Admitting: Family Medicine

## 2014-04-21 NOTE — Telephone Encounter (Signed)
Message left on vm 

## 2014-04-21 NOTE — Telephone Encounter (Signed)
Mom left a message that she has a cough and congestion and would like an abx called in

## 2014-04-21 NOTE — Telephone Encounter (Signed)
Antibiotic requests require a visit for evaluation unless an evaluation for the current illness has already occurred such as in her son's case.

## 2014-05-03 ENCOUNTER — Encounter: Payer: Self-pay | Admitting: Family Medicine

## 2014-05-25 ENCOUNTER — Other Ambulatory Visit: Payer: Self-pay | Admitting: Family Medicine

## 2014-05-27 ENCOUNTER — Encounter: Payer: Self-pay | Admitting: Family Medicine

## 2014-05-27 ENCOUNTER — Ambulatory Visit (INDEPENDENT_AMBULATORY_CARE_PROVIDER_SITE_OTHER): Payer: BLUE CROSS/BLUE SHIELD | Admitting: Family Medicine

## 2014-05-27 VITALS — BP 117/75 | HR 85 | Wt 195.0 lb

## 2014-05-27 DIAGNOSIS — M545 Low back pain, unspecified: Secondary | ICD-10-CM

## 2014-05-27 MED ORDER — PREDNISONE 50 MG PO TABS: ORAL_TABLET | ORAL | Status: DC

## 2014-05-27 MED ORDER — HYDROCODONE-ACETAMINOPHEN 10-325 MG PO TABS
0.5000 | ORAL_TABLET | Freq: Four times a day (QID) | ORAL | Status: DC | PRN
Start: 2014-05-27 — End: 2014-06-06

## 2014-05-27 MED ORDER — KETOROLAC TROMETHAMINE 60 MG/2ML IM SOLN
60.0000 mg | Freq: Once | INTRAMUSCULAR | Status: AC
  Administered 2014-05-27: 60 mg via INTRAMUSCULAR

## 2014-05-27 NOTE — Progress Notes (Signed)
CC: Deanna Keller is a 34 y.o. female is here for Back Pain   Subjective: HPI:  Complaints of left low back pain that is moderate to severe in severity worse with sitting or bearing weight on the left leg. Is improved with lying down flat or standing without bearing weight on left leg. Pain radiates down the back of the left leg but no further than the knee. No benefit from ibuprofen or Flexeril. Symptoms have been present for the last 2 days. Symptoms came on within a few hours after she tried to lift a heavy TV and It by herself. Symptoms feel identical to that which she experienced after giving birth last year. She denies any motor or sensory disturbances other than that described above. No midline back pain, saddle paresthesia nor bowel or bladder incontinence. Denies genitourinary complaints nor gastrointestinal complaints   Review Of Systems Outlined In HPI  Past Medical History  Diagnosis Date  . Anxiety   . Alcohol abuse     sober since 2011    Past Surgical History  Procedure Laterality Date  . Gastric bypass  2005  . Cervical cone biopsy     Family History  Problem Relation Age of Onset  . Colon cancer Mother   . Depression Mother   . Hypertension Father     History   Social History  . Marital Status: Married    Spouse Name: N/A  . Number of Children: N/A  . Years of Education: N/A   Occupational History  . Not on file.   Social History Main Topics  . Smoking status: Former Smoker -- 0.50 packs/day for 5 years    Types: Cigarettes    Start date: 07/21/2012  . Smokeless tobacco: Never Used  . Alcohol Use: No  . Drug Use: No  . Sexual Activity:    Partners: Male   Other Topics Concern  . Not on file   Social History Narrative     Objective: BP 117/75 mmHg  Pulse 85  Wt 195 lb (88.451 kg)  General: Alert and Oriented, No Acute Distress HEENT: Pupils equal, round, reactive to light. Conjunctivae clear.  Moist mucous membranes Lungs: Clear  comfortable work of breathing Back: Mild spinous process tenderness in lumbar region, mild reproduction of pain with palpation of the left paraspinal musculature in the lumbar region. Full range of motion strength of both lower extremity is with L4 and S1 DTR 2 over 4 bilaterally. Extremities: No peripheral edema.  Strong peripheral pulses.  Mental Status: No depression, anxiety, nor agitation. Skin: Warm and dry.  Assessment & Plan: Deanna Keller was seen today for back pain.  Diagnoses and all orders for this visit:  Midline low back pain without sciatica Orders: -     predniSONE (DELTASONE) 50 MG tablet; One tablet for 5 days. -     HYDROcodone-acetaminophen (NORCO) 10-325 MG per tablet; Take 0.5-1 tablets by mouth every 6 (six) hours as needed. -     ketorolac (TORADOL) injection 60 mg; Inject 2 mLs (60 mg total) into the muscle once.   Back pain: Given that symptoms are most identical to that which we handled last year will treat similar to pain last year with the above medications. If no better by next week physical therapy will be next.  Return if symptoms worsen or fail to improve.

## 2014-05-30 ENCOUNTER — Telehealth: Payer: Self-pay | Admitting: Family Medicine

## 2014-05-30 ENCOUNTER — Encounter: Payer: Self-pay | Admitting: Family Medicine

## 2014-05-30 ENCOUNTER — Other Ambulatory Visit: Payer: Self-pay | Admitting: Family Medicine

## 2014-05-30 MED ORDER — TRAMADOL HCL 50 MG PO TABS: 50.0000 mg | ORAL_TABLET | Freq: Three times a day (TID) | ORAL | Status: DC | PRN

## 2014-05-30 MED ORDER — GABAPENTIN 300 MG PO CAPS: 300.0000 mg | ORAL_CAPSULE | Freq: Three times a day (TID) | ORAL | Status: DC | PRN

## 2014-05-30 NOTE — Telephone Encounter (Signed)
Deanna Keller, Will you please let patient know that I've sent an [facility] which might help if her pain is coming from a pinched nerve.  It's important that she only take the hydrocodone as prescribed so as to avoid building a tolerance to it.  I'll print off an rx of tramadol that she can also use in conjunction with gabapentin and hydrocodone in hopes of minimizing her need for hydrocodone.  I'd strongly recommend she set up an appointment with Dr. Karie Schwalbe to get a better idea of what in her back in generating her pain.       Hey Dr. Ivan AnchorsHommel,    Following up with you from our appt on Friday, the pain is not only on left side now, it's more center and down to feet, as well as pain in front pelvis (had same pain after birth, like if it's pinched nerve)     I feel better after I take the prednisone but having to take 2 hydrocodone about every 4-6 hours. One doesn't affect anything. I know I don't absorb medicines well after my gastric bypass, but can you prescribe something stronger? Or able to put a refill on hydrocodone? I have about half left.    Thanks    Deanna Keller

## 2014-05-30 NOTE — Telephone Encounter (Signed)
Left detailed message advising of recommendations.  

## 2014-06-06 ENCOUNTER — Telehealth: Payer: Self-pay | Admitting: Family Medicine

## 2014-06-06 DIAGNOSIS — M5416 Radiculopathy, lumbar region: Secondary | ICD-10-CM

## 2014-06-06 MED ORDER — TRAMADOL HCL 50 MG PO TABS: 50.0000 mg | ORAL_TABLET | Freq: Three times a day (TID) | ORAL | Status: DC | PRN

## 2014-06-06 MED ORDER — GABAPENTIN 300 MG PO CAPS: 300.0000 mg | ORAL_CAPSULE | Freq: Three times a day (TID) | ORAL | Status: DC | PRN

## 2014-06-06 NOTE — Telephone Encounter (Signed)
Reports tramadol and gabapentin helpful, has yet to see Dr. Karie Schwalbe, urged to schedule with him ASAP

## 2014-06-14 ENCOUNTER — Other Ambulatory Visit: Payer: Self-pay | Admitting: Family Medicine

## 2014-06-14 ENCOUNTER — Encounter: Payer: Self-pay | Admitting: Family Medicine

## 2014-06-14 DIAGNOSIS — M5416 Radiculopathy, lumbar region: Secondary | ICD-10-CM

## 2014-06-14 MED ORDER — TRAMADOL HCL 50 MG PO TABS: 50.0000 mg | ORAL_TABLET | Freq: Three times a day (TID) | ORAL | Status: DC | PRN

## 2014-06-16 ENCOUNTER — Encounter: Payer: Self-pay | Admitting: Family Medicine

## 2014-06-16 DIAGNOSIS — F1021 Alcohol dependence, in remission: Secondary | ICD-10-CM | POA: Insufficient documentation

## 2014-06-20 ENCOUNTER — Ambulatory Visit (HOSPITAL_BASED_OUTPATIENT_CLINIC_OR_DEPARTMENT_OTHER): Payer: BLUE CROSS/BLUE SHIELD

## 2014-06-20 ENCOUNTER — Ambulatory Visit (INDEPENDENT_AMBULATORY_CARE_PROVIDER_SITE_OTHER): Payer: BLUE CROSS/BLUE SHIELD | Admitting: Sports Medicine

## 2014-06-20 ENCOUNTER — Ambulatory Visit (INDEPENDENT_AMBULATORY_CARE_PROVIDER_SITE_OTHER): Payer: BLUE CROSS/BLUE SHIELD

## 2014-06-20 ENCOUNTER — Other Ambulatory Visit: Payer: Self-pay | Admitting: Sports Medicine

## 2014-06-20 ENCOUNTER — Encounter: Payer: Self-pay | Admitting: Sports Medicine

## 2014-06-20 VITALS — BP 120/85 | HR 91 | Wt 199.0 lb

## 2014-06-20 DIAGNOSIS — G8929 Other chronic pain: Secondary | ICD-10-CM

## 2014-06-20 DIAGNOSIS — M545 Low back pain, unspecified: Secondary | ICD-10-CM

## 2014-06-20 DIAGNOSIS — M5416 Radiculopathy, lumbar region: Secondary | ICD-10-CM | POA: Diagnosis not present

## 2014-06-20 DIAGNOSIS — M5136 Other intervertebral disc degeneration, lumbar region: Secondary | ICD-10-CM | POA: Insufficient documentation

## 2014-06-20 DIAGNOSIS — M51369 Other intervertebral disc degeneration, lumbar region without mention of lumbar back pain or lower extremity pain: Secondary | ICD-10-CM | POA: Insufficient documentation

## 2014-06-20 MED ORDER — TRAMADOL HCL 50 MG PO TABS: 50.0000 mg | ORAL_TABLET | Freq: Three times a day (TID) | ORAL | Status: DC | PRN

## 2014-06-20 MED ORDER — GABAPENTIN 300 MG PO CAPS: 300.0000 mg | ORAL_CAPSULE | Freq: Three times a day (TID) | ORAL | Status: DC | PRN
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
## 2014-06-20 NOTE — Assessment & Plan Note (Signed)
Left L5. This likely occurred during labor. Formal physical therapy, x-ray, this is been present for so long and considering difficulty with stooling, we are going to obtain an MRI. Refilling tramadol and gabapentin. Return to see me to go over MRI results. She does desire to proceed sooner rather than later to interventional treatment.

## 2014-06-24 ENCOUNTER — Ambulatory Visit: Payer: BLUE CROSS/BLUE SHIELD | Admitting: Sports Medicine

## 2014-06-26 ENCOUNTER — Encounter: Payer: Self-pay | Admitting: Family Medicine

## 2014-06-27 ENCOUNTER — Ambulatory Visit (INDEPENDENT_AMBULATORY_CARE_PROVIDER_SITE_OTHER): Payer: BLUE CROSS/BLUE SHIELD

## 2014-06-27 DIAGNOSIS — M2578 Osteophyte, vertebrae: Secondary | ICD-10-CM

## 2014-06-27 DIAGNOSIS — M5127 Other intervertebral disc displacement, lumbosacral region: Secondary | ICD-10-CM

## 2014-06-27 DIAGNOSIS — M5416 Radiculopathy, lumbar region: Secondary | ICD-10-CM

## 2014-06-27 MED ORDER — CITALOPRAM HYDROBROMIDE 40 MG PO TABS: 40.0000 mg | ORAL_TABLET | Freq: Every day | ORAL | Status: DC

## 2014-06-29 ENCOUNTER — Encounter: Payer: Self-pay | Admitting: Family Medicine

## 2014-06-29 ENCOUNTER — Ambulatory Visit: Payer: Self-pay | Admitting: Family Medicine

## 2014-06-30 ENCOUNTER — Encounter: Payer: Self-pay | Admitting: Sports Medicine

## 2014-06-30 ENCOUNTER — Ambulatory Visit (INDEPENDENT_AMBULATORY_CARE_PROVIDER_SITE_OTHER): Payer: BLUE CROSS/BLUE SHIELD | Admitting: Sports Medicine

## 2014-06-30 ENCOUNTER — Other Ambulatory Visit: Payer: Self-pay | Admitting: Family Medicine

## 2014-06-30 ENCOUNTER — Encounter: Payer: Self-pay | Admitting: Family Medicine

## 2014-06-30 DIAGNOSIS — M5416 Radiculopathy, lumbar region: Secondary | ICD-10-CM | POA: Diagnosis not present

## 2014-06-30 NOTE — Progress Notes (Signed)
  Subjective:    CC: MRI report  HPI: This is a very pleasant 34 year old female, since the birth of her child 8 months ago she's had significant back pain with radiation down the left leg in a clinical L5 distribution. She has been through physical therapy, and is somewhat desperate for relief and a diagnosis. We suspected L5 radiculopathy last visit, and decided to proceed with MRI for epidural planning relatively rapidly. Pain continues to be moderate, persistent.  Past medical history, Surgical history, Family history not pertinant except as noted below, Social history, Allergies, and medications have been entered into the medical record, reviewed, and no changes needed.   Review of Systems: No fevers, chills, night sweats, weight loss, chest pain, or shortness of breath.   Objective:    General: Well Developed, well nourished, and in no acute distress.  Neuro: Alert and oriented x3, extra-ocular muscles intact, sensation grossly intact.  HEENT: Normocephalic, atraumatic, pupils equal round reactive to light, neck supple, no masses, no lymphadenopathy, thyroid nonpalpable.  Skin: Warm and dry, no rashes. Cardiac: Regular rate and rhythm, no murmurs rubs or gallops, no lower extremity edema.  Respiratory: Clear to auscultation bilaterally. Not using accessory muscles, speaking in full sentences.  MRI shows retrolisthesis of L5 on S1, grade 1, as well as degenerative disc disease with desiccation, and a leftward protrusion into the left L5-S1 neural foramen likely coming in close contact with the left L5 nerve root.  Impression and Recommendations:

## 2014-06-30 NOTE — Assessment & Plan Note (Addendum)
MRI does show slight L5 on S1 retrolisthesis with a leftward disc protrusion in the subarticular and foraminal zone that likely impinges the L5 nerve root. This matches her left-sided L5 distribution radiculopathy. She will continue with physical therapy, and we are going to proceed with a left-sided L5-S1 transforaminal epidural. I would like to see her back in approximately 3 weeks to evaluate response from injection. Also increase gabapentin to 600 mg 3 times a day. Tramadol has become ineffective.

## 2014-07-03 ENCOUNTER — Other Ambulatory Visit: Payer: Self-pay | Admitting: Sports Medicine

## 2014-07-11 ENCOUNTER — Other Ambulatory Visit: Payer: Self-pay | Admitting: Sports Medicine

## 2014-07-13 ENCOUNTER — Ambulatory Visit
Admission: RE | Admit: 2014-07-13 | Discharge: 2014-07-13 | Disposition: A | Discharge status: Home / Self Care | Payer: BLUE CROSS/BLUE SHIELD | Source: Ambulatory Visit | Attending: Sports Medicine | Admitting: Sports Medicine

## 2014-07-13 DIAGNOSIS — M5416 Radiculopathy, lumbar region: Secondary | ICD-10-CM

## 2014-07-13 MED ORDER — METHYLPREDNISOLONE ACETATE 40 MG/ML INJ SUSP (RADIOLOG
120.0000 mg | Freq: Once | INTRAMUSCULAR | Status: AC
  Administered 2014-07-13: 120 mg via EPIDURAL

## 2014-07-13 MED ORDER — IOHEXOL 180 MG/ML  SOLN
1.0000 mL | Freq: Once | INTRAMUSCULAR | Status: AC | PRN
  Administered 2014-07-13: 1 mL via EPIDURAL

## 2014-07-13 NOTE — Discharge Instructions (Signed)

## 2014-07-14 ENCOUNTER — Other Ambulatory Visit: Payer: Self-pay | Admitting: Family Medicine

## 2014-07-15 ENCOUNTER — Other Ambulatory Visit: Payer: BLUE CROSS/BLUE SHIELD

## 2014-07-15 MED ORDER — CYCLOBENZAPRINE HCL 10 MG PO TABS: 10.0000 mg | ORAL_TABLET | Freq: Three times a day (TID) | ORAL | Status: DC | PRN

## 2014-07-16 ENCOUNTER — Other Ambulatory Visit: Payer: Self-pay | Admitting: Sports Medicine

## 2014-07-18 ENCOUNTER — Other Ambulatory Visit: Payer: BLUE CROSS/BLUE SHIELD

## 2014-07-19 ENCOUNTER — Other Ambulatory Visit: Payer: Self-pay | Admitting: Sports Medicine

## 2014-07-24 ENCOUNTER — Other Ambulatory Visit: Payer: Self-pay | Admitting: Sports Medicine

## 2014-08-01 ENCOUNTER — Other Ambulatory Visit: Payer: Self-pay | Admitting: Sports Medicine

## 2014-08-03 ENCOUNTER — Other Ambulatory Visit: Payer: Self-pay | Admitting: Sports Medicine

## 2014-08-08 ENCOUNTER — Encounter: Payer: Self-pay | Admitting: Sports Medicine

## 2014-08-08 DIAGNOSIS — M5416 Radiculopathy, lumbar region: Secondary | ICD-10-CM

## 2014-08-09 ENCOUNTER — Other Ambulatory Visit: Payer: Self-pay | Admitting: Sports Medicine

## 2014-08-15 ENCOUNTER — Other Ambulatory Visit: Payer: Self-pay | Admitting: Family Medicine

## 2014-08-15 ENCOUNTER — Ambulatory Visit
Admission: RE | Admit: 2014-08-15 | Discharge: 2014-08-15 | Disposition: A | Discharge status: Home / Self Care | Payer: BLUE CROSS/BLUE SHIELD | Source: Ambulatory Visit | Attending: Sports Medicine | Admitting: Sports Medicine

## 2014-08-15 ENCOUNTER — Other Ambulatory Visit: Payer: Self-pay | Admitting: Sports Medicine

## 2014-08-15 MED ORDER — IOHEXOL 180 MG/ML  SOLN: 1.0000 mL | Freq: Once | INTRAMUSCULAR | Status: AC | PRN

## 2014-08-15 MED ORDER — METHYLPREDNISOLONE ACETATE 40 MG/ML INJ SUSP (RADIOLOG: 120.0000 mg | Freq: Once | INTRAMUSCULAR | Status: DC

## 2014-08-16 ENCOUNTER — Other Ambulatory Visit: Payer: Self-pay | Admitting: Family Medicine

## 2014-08-18 ENCOUNTER — Encounter: Payer: Self-pay | Admitting: Family Medicine

## 2014-08-18 ENCOUNTER — Ambulatory Visit (INDEPENDENT_AMBULATORY_CARE_PROVIDER_SITE_OTHER): Payer: BLUE CROSS/BLUE SHIELD | Admitting: Family Medicine

## 2014-08-18 VITALS — BP 112/74 | HR 86 | Temp 98.3°F | Ht 65.0 in | Wt 204.0 lb

## 2014-08-18 DIAGNOSIS — F418 Other specified anxiety disorders: Secondary | ICD-10-CM | POA: Diagnosis not present

## 2014-08-18 DIAGNOSIS — F419 Anxiety disorder, unspecified: Secondary | ICD-10-CM

## 2014-08-18 DIAGNOSIS — J329 Chronic sinusitis, unspecified: Secondary | ICD-10-CM

## 2014-08-18 DIAGNOSIS — A499 Bacterial infection, unspecified: Secondary | ICD-10-CM

## 2014-08-18 DIAGNOSIS — B9689 Other specified bacterial agents as the cause of diseases classified elsewhere: Secondary | ICD-10-CM

## 2014-08-18 DIAGNOSIS — F32A Depression, unspecified: Secondary | ICD-10-CM

## 2014-08-18 DIAGNOSIS — F329 Major depressive disorder, single episode, unspecified: Secondary | ICD-10-CM

## 2014-08-18 MED ORDER — VILAZODONE HCL 10 & 20 MG PO KIT: 1.0000 | PACK | Freq: Every day | ORAL | Status: DC

## 2014-08-18 MED ORDER — CEFDINIR 300 MG PO CAPS: 300.0000 mg | ORAL_CAPSULE | Freq: Two times a day (BID) | ORAL | Status: DC

## 2014-08-18 NOTE — Progress Notes (Signed)
CC: Deanna Keller is a 34 y.o. female is here for dry cough   Subjective: HPI:  Nonproductive cough, sore throat, postnasal drip and facial pressure that has been present for the past 3 or 4 days. Accompanied by subjective chills and fevers. No benefit from over-the-counter cough and cold medication or ibuprofen. Symptoms are present all hours of the day seem to be worsening daily. Overall symptoms are moderate in severity. She denies wheezing, chest pain, shortness of breath, confusion, nor rash. Eyes headache other than above.  She complains that since the birth of her son she has felt like she's lost all interest in hobbies, she no longer has any sex drive, she feels much more emotional and subjectively depressed she does not believe that citalopram is helping despite increasing the dose recently. She denies any known side effects to his medication. no thoughts of wanting to harm self or others. She wants no change changes something else, she's been on Wellbutrin in the past but it was ineffective.   Review Of Systems Outlined In HPI  Past Medical History  Diagnosis Date  . Anxiety   . Alcohol abuse     sober since 2011    Past Surgical History  Procedure Laterality Date  . Gastric bypass  2005  . Cervical cone biopsy     Family History  Problem Relation Age of Onset  . Colon cancer Mother   . Depression Mother   . Hypertension Father     History   Social History  . Marital Status: Married    Spouse Name: N/A  . Number of Children: N/A  . Years of Education: N/A   Occupational History  . Not on file.   Social History Main Topics  . Smoking status: Former Smoker -- 0.50 packs/day for 5 years    Types: Cigarettes    Start date: 07/21/2012  . Smokeless tobacco: Never Used  . Alcohol Use: No  . Drug Use: No  . Sexual Activity:    Partners: Male   Other Topics Concern  . Not on file   Social History Narrative     Objective: BP 112/74 mmHg  Pulse 86  Temp(Src)  98.3 F (36.8 C)  Ht _0  (1.651 m)  Wt 204 lb (92.534 kg)  BMI 33.95 kg/m2  General: Alert and Oriented, No Acute Distress HEENT: Pupils equal, round, reactive to light. Conjunctivae clear.  External ears unremarkable, canals clear with intact TMs with appropriate landmarks.  Middle ear appears open without effusion. Pink inferior turbinates.  Moist mucous membranes, pharynx without inflammation nor lesions.  Neck supple without palpable lymphadenopathy nor abnormal masses. Lungs: Clear to auscultation bilaterally, no wheezing/ronchi/rales.  Comfortable work of breathing. Good air movement. Cardiac: Regular rate and rhythm. Normal S1/S2.  No murmurs, rubs, nor gallops.   Mental Status: No anxiety, nor agitation. Mildly depressed Skin: Warm and dry.  Assessment & Plan: Deanna Keller was seen today for dry cough.  Diagnoses and all orders for this visit:  Bacterial sinusitis  Anxiety and depression  Other orders -     Vilazodone HCl (VIIBRYD STARTER PACK) 10 & 20 MG KIT; Take 1 tablet by mouth daily. -     cefdinir (OMNICEF) 300 MG capsule; Take 1 capsule (300 mg total) by mouth 2 (two) times daily.   Bacterial sinusitis: Start cefdinir consider nasal saline washes Anxiety and depression: Anxiety seems to be well controlled however uncontrolled depression therefore stop citalopram by taking half a tablet daily for 5 days  then switch to viibrid starter pack  Return in about 4 weeks (around 09/15/2014).

## 2014-08-19 ENCOUNTER — Other Ambulatory Visit: Payer: BLUE CROSS/BLUE SHIELD

## 2014-08-22 ENCOUNTER — Encounter: Payer: Self-pay | Admitting: Family Medicine

## 2014-08-22 ENCOUNTER — Telehealth: Payer: Self-pay | Admitting: Family Medicine

## 2014-08-22 DIAGNOSIS — R55 Syncope and collapse: Secondary | ICD-10-CM

## 2014-08-22 NOTE — Telephone Encounter (Signed)
Received fax for pa on VIIBRYD starter pack sent through cover my meds waiting on auth. - CF

## 2014-08-25 ENCOUNTER — Ambulatory Visit (INDEPENDENT_AMBULATORY_CARE_PROVIDER_SITE_OTHER): Payer: BLUE CROSS/BLUE SHIELD | Admitting: Family Medicine

## 2014-08-25 ENCOUNTER — Encounter: Payer: Self-pay | Admitting: Family Medicine

## 2014-08-25 VITALS — BP 119/76 | HR 91 | Wt 200.0 lb

## 2014-08-25 DIAGNOSIS — F329 Major depressive disorder, single episode, unspecified: Secondary | ICD-10-CM | POA: Diagnosis not present

## 2014-08-25 DIAGNOSIS — R569 Unspecified convulsions: Secondary | ICD-10-CM | POA: Diagnosis not present

## 2014-08-25 DIAGNOSIS — F32A Depression, unspecified: Secondary | ICD-10-CM

## 2014-08-25 MED ORDER — LEVETIRACETAM 500 MG PO TABS: 500.0000 mg | ORAL_TABLET | Freq: Two times a day (BID) | ORAL | Status: DC

## 2014-08-25 MED ORDER — METHOCARBAMOL 500 MG PO TABS: 500.0000 mg | ORAL_TABLET | Freq: Three times a day (TID) | ORAL | Status: DC

## 2014-08-25 MED ORDER — FLUOXETINE HCL 20 MG PO TABS: 20.0000 mg | ORAL_TABLET | Freq: Every day | ORAL | Status: DC

## 2014-08-25 MED ORDER — HYDROCODONE-ACETAMINOPHEN 10-325 MG PO TABS: 1.0000 | ORAL_TABLET | Freq: Three times a day (TID) | ORAL | Status: DC | PRN

## 2014-08-25 NOTE — Progress Notes (Signed)
CC: Deanna Keller is a 34 y.o. female is here for f/u seizure   Subjective: HPI:  While vacationing patient was sitting and resting after taking a 50 mg dose of tramadol which was her third dose that morning. A few minutes after taking the medication her husband witnessed her fall forward to the floor from a seated position with her teeth clenched and her eyes wide open. She hit the floor and her husband had to arouse her physically to get her to become responsive. Episode lasted a few seconds and afterwards she felt nauseous confused and disoriented for a few minutes. They were seen at a local emergency room CT scan of the head was unremarkable and she tells me that she was told blood work was normal other than a mild increase in potassium. No new episodes since that occurrence last week. She has stopped taking tramadol now and instead is using hydrocodone and Robaxin. She denies any other motor or sensory disturbances. There was no loss of bowel or bladder function. Husband reports she looked rigid and stiff.  Follow-up depression: She is unable to afford viibryd. She still believes that her depression is uncontrolled and would like to try something other than Celexa.   Review Of Systems Outlined In HPI  Past Medical History  Diagnosis Date  . Anxiety   . Alcohol abuse     sober since 2011    Past Surgical History  Procedure Laterality Date  . Gastric bypass  2005  . Cervical cone biopsy     Family History  Problem Relation Age of Onset  . Colon cancer Mother   . Depression Mother   . Hypertension Father     History   Social History  . Marital Status: Married    Spouse Name: N/A  . Number of Children: N/A  . Years of Education: N/A   Occupational History  . Not on file.   Social History Main Topics  . Smoking status: Former Smoker -- 0.50 packs/day for 5 years    Types: Cigarettes    Start date: 07/21/2012  . Smokeless tobacco: Never Used  . Alcohol Use: No  . Drug  Use: No  . Sexual Activity:    Partners: Male   Other Topics Concern  . Not on file   Social History Narrative     Objective: BP 119/76 mmHg  Pulse 91  Wt 200 lb (90.719 kg)  Vital signs reviewed. General: Alert and Oriented, No Acute Distress HEENT: Pupils equal, round, reactive to light. Conjunctivae clear.  External ears unremarkable.  Moist mucous membranes. Lungs: Clear and comfortable work of breathing, speaking in full sentences without accessory muscle use. Cardiac: Regular rate and rhythm.  Neuro: CN II-XII grossly intact, gait normal. Full range of motion strength of all extremities. Coordination normal. Extremities: No peripheral edema.  Strong peripheral pulses.  Mental Status: No depression, anxiety, nor agitation. Logical though process. Skin: Warm and dry.  Assessment & Plan: Deanna Keller was seen today for f/u seizure.  Diagnoses and all orders for this visit:  Depression Orders: -     FLUoxetine (PROZAC) 20 MG tablet; Take 1 tablet (20 mg total) by mouth daily.  Convulsions, unspecified convulsion type Orders: -     levETIRAcetam (KEPPRA) 500 MG tablet; Take 1 tablet (500 mg total) by mouth 2 (two) times daily.  Other orders -     HYDROcodone-acetaminophen (NORCO) 10-325 MG per tablet; Take 1 tablet by mouth every 8 (eight) hours as needed. -  methocarbamol (ROBAXIN) 500 MG tablet; Take 1 tablet (500 mg total) by mouth 3 (three) times daily.   Depression: Begin fluoxetine Convulsions: when indicating with her on my chart my suspicion was that she had a vasovagal syncopal event however the way she describes the event today with the news of her taking a high dose of tramadol makes you wonder she actually did have a seizure. Her story sounds more convincing of possible seizure therefore start Keppra and follow through with neurology referral was placed yesterday for their second opinion on the matter and possible further testing.Signs and symptoms requring  emergent/urgent reevaluation were discussed with the patient. Stopping tramadol switching to hydrocodone and Robaxin urged to follow-up with Dr. Karie Schwalbe if back pain continues because she may need to pursue surgical intervention.  Return in about 4 weeks (around 09/22/2014) for Mood.

## 2014-08-26 ENCOUNTER — Telehealth: Payer: Self-pay | Admitting: *Deleted

## 2014-08-26 NOTE — Telephone Encounter (Signed)
From  Orson GearBlair Gruhn   To  Laren BoomSean Hommel, DO   Sent  08/26/2014 11:22 AM      Hi Dr. Rexene EdisonH-  One of the medications is making me feel nauseous- can you please call me in something?  [facility] [address]  Please?  Thanks  [name]  I see dr. Luberta Robertsonrowell Monday at 3pm with [facility] ( I can't wait until mid July to see the referral)

## 2014-08-29 MED ORDER — ONDANSETRON HCL 4 MG PO TABS: 4.0000 mg | ORAL_TABLET | Freq: Three times a day (TID) | ORAL | Status: DC | PRN

## 2014-08-29 NOTE — Telephone Encounter (Signed)
[facility] [address] to be used only as needed for nausea.

## 2014-08-30 ENCOUNTER — Encounter: Payer: Self-pay | Admitting: Family Medicine

## 2014-08-30 DIAGNOSIS — R569 Unspecified convulsions: Secondary | ICD-10-CM | POA: Insufficient documentation

## 2014-08-31 ENCOUNTER — Encounter: Payer: Self-pay | Admitting: Family Medicine

## 2014-09-01 MED ORDER — PROMETHAZINE HCL 12.5 MG PO TABS: 12.5000 mg | ORAL_TABLET | Freq: Three times a day (TID) | ORAL | Status: DC | PRN

## 2014-09-01 NOTE — Telephone Encounter (Signed)
Dr. Ivan AnchorsHommel discontinued medication on 08/25/2014

## 2014-09-01 NOTE — Telephone Encounter (Signed)
reply

## 2014-09-09 ENCOUNTER — Encounter: Payer: Self-pay | Admitting: Family Medicine

## 2014-09-09 ENCOUNTER — Other Ambulatory Visit: Payer: Self-pay | Admitting: Family Medicine

## 2014-09-09 MED ORDER — HYDROCODONE-ACETAMINOPHEN 10-325 MG PO TABS: 1.0000 | ORAL_TABLET | Freq: Three times a day (TID) | ORAL | Status: DC | PRN

## 2014-09-09 MED ORDER — GABAPENTIN 300 MG PO CAPS
300.0000 mg | ORAL_CAPSULE | Freq: Three times a day (TID) | ORAL | Status: DC
Start: 2014-09-09 — End: 2014-09-23

## 2014-09-23 ENCOUNTER — Ambulatory Visit (INDEPENDENT_AMBULATORY_CARE_PROVIDER_SITE_OTHER): Payer: BLUE CROSS/BLUE SHIELD | Admitting: Family Medicine

## 2014-09-23 ENCOUNTER — Encounter: Payer: Self-pay | Admitting: Family Medicine

## 2014-09-23 VITALS — BP 114/72 | HR 78 | Wt 195.0 lb

## 2014-09-23 DIAGNOSIS — H6122 Impacted cerumen, left ear: Secondary | ICD-10-CM | POA: Diagnosis not present

## 2014-09-23 DIAGNOSIS — F329 Major depressive disorder, single episode, unspecified: Secondary | ICD-10-CM | POA: Diagnosis not present

## 2014-09-23 DIAGNOSIS — L309 Dermatitis, unspecified: Secondary | ICD-10-CM

## 2014-09-23 DIAGNOSIS — M5416 Radiculopathy, lumbar region: Secondary | ICD-10-CM

## 2014-09-23 DIAGNOSIS — F32A Depression, unspecified: Secondary | ICD-10-CM

## 2014-09-23 DIAGNOSIS — B354 Tinea corporis: Secondary | ICD-10-CM | POA: Diagnosis not present

## 2014-09-23 MED ORDER — CLOTRIMAZOLE 1 % EX CREA: TOPICAL_CREAM | CUTANEOUS | Status: AC

## 2014-09-23 MED ORDER — TRIAMCINOLONE ACETONIDE 0.1 % EX CREA: 1.0000 "application " | TOPICAL_CREAM | Freq: Two times a day (BID) | CUTANEOUS | Status: DC

## 2014-09-23 MED ORDER — FLUOXETINE HCL 20 MG PO TABS: 40.0000 mg | ORAL_TABLET | Freq: Every day | ORAL | Status: DC

## 2014-09-23 MED ORDER — PREGABALIN 75 MG PO CAPS: ORAL_CAPSULE | ORAL | Status: DC

## 2014-09-23 MED ORDER — HYDROCODONE-ACETAMINOPHEN 10-325 MG PO TABS: 1.0000 | ORAL_TABLET | Freq: Three times a day (TID) | ORAL | Status: DC | PRN

## 2014-09-23 NOTE — Progress Notes (Signed)
CC: Deanna Keller is a 34 y.o. female is here for f/u mood   Subjective: HPI:  Follow-up depression: She's been taking Prozac 20 mg daily and believes it is helping with her depression. She's had the motivation to start an exercise routine and make some healthy changes with her diet. She seems more interested in old hobbies but still considers room for improvement. Denies any anxiety or any thoughts of wanting to harm self or others.  Complains of a rash on the right forearm. It's been there ever since she used a Band-Aid to cover up a small wound. It's discolored does not cause any pain nor does it itch. It does not seem to be getting better or worsens onset  She's also had some circular lesions that are itchy on the left leg and a single one on the right leg. No interventions is a yet but does seem as there getting larger. Denies fevers, chills or swollen lymph nodes  Follow-up left lumbar radiculitis: She tells me gabapentin is somewhat helpful but causes intolerable sedation even if taking only 100 mg. When her pain is flaring she gets some benefit from hydrocodone. She wants her something other that Lyrica for her pain. Still slightly midline but mostly radiating down the left leg. Denies any new motor or sensory disturbances  Complains of a cracking sensation in her left ear every time she swallows. This been going on for almost 3 months now. She is uncertain about any decrease in hearing. No manipulation of the left ear recently. Denies any discharge.  Review Of Systems Outlined In HPI  Past Medical History  Diagnosis Date  . Anxiety   . Alcohol abuse     sober since 2011    Past Surgical History  Procedure Laterality Date  . Gastric bypass  2005  . Cervical cone biopsy     Family History  Problem Relation Age of Onset  . Colon cancer Mother   . Depression Mother   . Hypertension Father     History   Social History  . Marital Status: Married    Spouse Name: N/A  .  Number of Children: N/A  . Years of Education: N/A   Occupational History  . Not on file.   Social History Main Topics  . Smoking status: Former Smoker -- 0.50 packs/day for 5 years    Types: Cigarettes    Start date: 07/21/2012  . Smokeless tobacco: Never Used  . Alcohol Use: No  . Drug Use: No  . Sexual Activity:    Partners: Male   Other Topics Concern  . Not on file   Social History Narrative     Objective: BP 114/72 mmHg  Pulse 78  Wt 195 lb (88.451 kg)  General: Alert and Oriented, No Acute Distress HEENT: Pupils equal, round, reactive to light. Conjunctivae clear.  External ears unremarkable, initially the left canal has a mild cerumen impaction next to the eardrum after removal of the severe wax canals clear with intact TMs with appropriate landmarks.  Middle ear appears open without effusion. Pink inferior turbinates.  Lungs: clear comfortable work of breathing Cardiac: Regular rate and rhythm.  Extremities: No peripheral edema.  Strong peripheral pulses.  Mental Status: No depression, anxiety, nor agitation. Skin: Warm and dry. Hyperpigmented rectangle on the right forearm, circular ringworm eruptions on the left proximal thigh  Assessment & Plan: Deanna Keller was seen today for f/u mood.  Diagnoses and all orders for this visit:  Depression Orders: -  FLUoxetine (PROZAC) 20 MG tablet; Take 2 tablets (40 mg total) by mouth daily.  Dermatitis Orders: -     triamcinolone cream (KENALOG) 0.1 %; Apply 1 application topically 2 (two) times daily. (Allergic reaction)  Ringworm of body Orders: -     clotrimazole (LOTRIMIN) 1 % cream; Apply to affected areas twice a day for up to four weeks, applying up to two weeks after resolution of symptoms. (ringworm)  Left lumbar radiculopathy  Other orders -     HYDROcodone-acetaminophen (NORCO) 10-325 MG per tablet; Take 1 tablet by mouth every 8 (eight) hours as needed. -     pregabalin (LYRICA) 75 MG capsule; One by  mouth nightly.   Depression: Uncontrolled chronic condition increasing fluoxetine Dermatitis: Discussed that it appears that some allergic reaction from adhesive that she was exposed to. Start triamcinolone cream but discussed that this may be there for the rest of the summer Ringworm:start clotrimazole Left lumbar radiculopathy: Stopping gabapentin switched to Lyrica Cerumen impaction of the left ear canal removed with flushing and curettage  Return in about 1 month (around 10/23/2014).  Indication: Cerumen impaction of the ear Medical necessity statement: On physical examination, cerumen impairs clinically significant portions of the external auditory canal, and tympanic membrane. Noted obstructive, copious cerumen that cannot be removed without magnification and instrumentations requiring physician skills Consent: Discussed benefits and risks of procedure and verbal consent obtained Procedure: Patient was prepped for the procedure. Utilized an otoscope to assess and take note of the ear canal, the tympanic membrane, and the presence, amount, and placement of the cerumen. Gentle water irrigation and soft plastic curette was utilized to remove cerumen.  Post procedure examination: shows cerumen was completely removed. Patient tolerated procedure well. The patient is made aware that they may experience temporary vertigo, temporary hearing loss, and temporary discomfort. If these symptom last for more than 24 hours to call the clinic or proceed to the ED.  [phone]  Modifier 25

## 2014-09-26 ENCOUNTER — Encounter: Payer: Self-pay | Admitting: Family Medicine

## 2014-09-29 ENCOUNTER — Encounter: Payer: Self-pay | Admitting: Family Medicine

## 2014-10-01 ENCOUNTER — Other Ambulatory Visit: Payer: Self-pay | Admitting: Family Medicine

## 2014-10-03 ENCOUNTER — Encounter: Payer: Self-pay | Admitting: Family Medicine

## 2014-10-03 ENCOUNTER — Other Ambulatory Visit: Payer: Self-pay | Admitting: Family Medicine

## 2014-10-03 DIAGNOSIS — M5416 Radiculopathy, lumbar region: Secondary | ICD-10-CM

## 2014-10-03 MED ORDER — HYDROCODONE-ACETAMINOPHEN 10-325 MG PO TABS: 1.0000 | ORAL_TABLET | Freq: Three times a day (TID) | ORAL | Status: DC | PRN

## 2014-10-03 MED ORDER — PREGABALIN 75 MG PO CAPS: ORAL_CAPSULE | ORAL | Status: DC

## 2014-10-03 NOTE — Addendum Note (Signed)
Addended by: Laren Boom on: 10/03/2014 04:38 PM   Modules accepted: Orders

## 2014-10-04 ENCOUNTER — Telehealth: Payer: Self-pay | Admitting: Family Medicine

## 2014-10-04 NOTE — Telephone Encounter (Signed)
Received fax for prior authorization on Lyrica 75 mg Capsules called [employer] and [facility] they sent a form to be completed. I completed the form and placed in Dr. Genelle Bal box for a signature will fax when signed. - CF

## 2014-10-07 NOTE — Telephone Encounter (Signed)
Received fax from [facility] and they denied coverage on Lyrica due to not meeting criteria for coverage. - CF

## 2014-10-10 ENCOUNTER — Telehealth: Payer: Self-pay | Admitting: Family Medicine

## 2014-10-10 MED ORDER — HYDROCODONE-ACETAMINOPHEN 10-325 MG PO TABS: 1.0000 | ORAL_TABLET | Freq: Three times a day (TID) | ORAL | Status: DC | PRN

## 2014-10-10 NOTE — Telephone Encounter (Signed)
Refill request pending pain management referral

## 2014-10-11 ENCOUNTER — Encounter: Payer: Self-pay | Admitting: Family Medicine

## 2014-10-11 DIAGNOSIS — M5416 Radiculopathy, lumbar region: Secondary | ICD-10-CM | POA: Insufficient documentation

## 2014-10-14 ENCOUNTER — Other Ambulatory Visit: Payer: Self-pay | Admitting: Family Medicine

## 2014-10-14 ENCOUNTER — Telehealth: Payer: Self-pay | Admitting: Family Medicine

## 2014-10-14 MED ORDER — METHOCARBAMOL 500 MG PO TABS: 500.0000 mg | ORAL_TABLET | Freq: Three times a day (TID) | ORAL | Status: DC

## 2014-10-14 NOTE — Telephone Encounter (Signed)
Marylene Landngela told me that patient requested a refill on hydrocodone via MyChart.  The [place] controlled substance database confirms that she had this filled on June 27 of June. There is no need for her to need a refill this early. I will be gone for the next week and have asked Marylene Landngela to let other providers know that I would prefer that she does not get prescribed any narcotics from any provider in our office other than myself. I'm going to give her the benefit of the doubt that she requested this early by accident when appropriately asking for refill of Robaxin.

## 2014-10-14 NOTE — Addendum Note (Signed)
Addended by: Laren BoomHOMMEL, Sabirin Baray on: 10/14/2014 05:13 PM   Modules accepted: Orders

## 2014-10-18 ENCOUNTER — Encounter: Payer: Self-pay | Admitting: Family Medicine

## 2014-10-18 ENCOUNTER — Telehealth: Payer: Self-pay | Admitting: *Deleted

## 2014-10-18 ENCOUNTER — Other Ambulatory Visit: Payer: Self-pay | Admitting: Family Medicine

## 2014-10-18 NOTE — Telephone Encounter (Signed)
Pt called and left a message stating that she thought she had a rx last for # 30 of hydrocodone when it was for #60. She states she would like to come in on Thursday and pick rx up for a refill. It will still be early for this med to be filled since it was filled on 10/10/14.left message on pt advising her of this

## 2014-10-23 ENCOUNTER — Encounter: Payer: Self-pay | Admitting: Family Medicine

## 2014-10-24 ENCOUNTER — Telehealth: Payer: Self-pay | Admitting: Family Medicine

## 2014-10-24 NOTE — Telephone Encounter (Signed)
Called the pain clinic where referral was sent to check on the status of referral

## 2014-10-24 NOTE — Telephone Encounter (Signed)
Deanna Keller, Can you please help address this patient's concerns?  I think she's eligible for a refill of her hydrocodone sooner than the 27th based on the date of her last Rx if she were taking a single tab three times a day.  Also I'd recommend she refer to a MyChart message from 06/13/13 where I advised her only to take medication as Rxed, she has made the decision to take it different than Rxed.  ----- Message from [name], [name] sent at 10/24/2014 8:18 AM EDT -----             ----- Message from [name] to [name], DO sent at 10/23/2014 4:19 PM -----     Dr Rexene EdisonH-    I am running low on hydrocodone bc I'm having to take 2 at times instead of one. Nurse said they can't refill until 27th, the prescription doesn't even last that long. I still haven't gotten a call from Pain Clinic. I need a refill, currently this is not working but need something to get me by until I can get into the pain clinic. I tried to make an appt. with you, but first available is friday,     Can you please check in with pain clinic for an appointment, and refill on Hydrocodone. I have to take two instead of one. I am even willing to get another epidural for pain.    Thank you    Deanna Keller

## 2014-10-24 NOTE — Telephone Encounter (Signed)
Pt.notified

## 2014-10-26 NOTE — Telephone Encounter (Signed)
I have called and left a message at the pain clinic in re status of pt's referral and have not heard back yet. Can you please check on this

## 2014-10-26 NOTE — Telephone Encounter (Signed)
Elease Hashimotoatricia can you please look into referral for this patient for [facility] for [facility] clinic

## 2014-10-26 NOTE — Telephone Encounter (Signed)
The only referral I see regarding pain clinic was to [facility] and it says it is closed. Deanna Keller did this referral so I am not sure why it got closed.

## 2014-10-27 ENCOUNTER — Telehealth: Payer: Self-pay | Admitting: *Deleted

## 2014-10-27 NOTE — Telephone Encounter (Signed)
Per Elease HashimotoPatricia pt has an appt scheduled for 10/31/08 with pain clinic

## 2014-11-01 ENCOUNTER — Encounter: Payer: Self-pay | Admitting: Family Medicine

## 2014-11-01 ENCOUNTER — Ambulatory Visit (INDEPENDENT_AMBULATORY_CARE_PROVIDER_SITE_OTHER): Payer: BLUE CROSS/BLUE SHIELD | Admitting: Family Medicine

## 2014-11-01 VITALS — BP 129/81 | HR 84 | Wt 195.0 lb

## 2014-11-01 DIAGNOSIS — M5416 Radiculopathy, lumbar region: Secondary | ICD-10-CM | POA: Diagnosis not present

## 2014-11-01 MED ORDER — OXYCODONE-ACETAMINOPHEN 10-325 MG PO TABS: 0.5000 | ORAL_TABLET | Freq: Three times a day (TID) | ORAL | Status: DC | PRN

## 2014-11-01 NOTE — Progress Notes (Signed)
CC: Deanna Keller is a 34 y.o. female is here for discuss meds   Subjective: HPI:  Follow-up lumbar radiculitis: She still does not want to pursue surgery. She is under the impression that the pain management clinic that she is going to at 4:15 today will not be providing narcotics and she is going through surgery. She is open to the idea of getting epidural injections or other back injections if offered by the pain management clinic downstairs. She ran out of hydrocodone 2 days ago and has had some much pain that she is crying most days because of her low back pain. She admits that she was taking 2 of these in the morning to try to control the pain to a satisfactory degree. She feels like her most recent prescription did not adequately address her pain. She denies any new change the character severity or frequency of her pain. No new accidents or injuries. Denies any motor or sensory disturbances   Review Of Systems Outlined In HPI  Past Medical History  Diagnosis Date  . Anxiety   . Alcohol abuse     sober since 2011    Past Surgical History  Procedure Laterality Date  . Gastric bypass  2005  . Cervical cone biopsy     Family History  Problem Relation Age of Onset  . Colon cancer Mother   . Depression Mother   . Hypertension Father     History   Social History  . Marital Status: Married    Spouse Name: N/A  . Number of Children: N/A  . Years of Education: N/A   Occupational History  . Not on file.   Social History Main Topics  . Smoking status: Former Smoker -- 0.50 packs/day for 5 years    Types: Cigarettes    Start date: 07/21/2012  . Smokeless tobacco: Never Used  . Alcohol Use: No  . Drug Use: No  . Sexual Activity:    Partners: Male   Other Topics Concern  . Not on file   Social History Narrative     Objective: BP 129/81 mmHg  Pulse 84  Wt 195 lb (88.451 kg)  Vital signs reviewed. General: Alert and Oriented, No Acute Distress HEENT: Pupils equal,  round, reactive to light. Conjunctivae clear.  External ears unremarkable.  Moist mucous membranes. Lungs: Clear and comfortable work of breathing, speaking in full sentences without accessory muscle use. Cardiac: Regular rate and rhythm.  Neuro: CN II-XII grossly intact, gait normal. Extremities: No peripheral edema.  Strong peripheral pulses.  Mental Status: No depression, anxiety, nor agitation. Logical though process. Skin: Warm and dry. Assessment & Plan: Deanna Keller was seen today for discuss meds.  Diagnoses and all orders for this visit:  Left lumbar radiculitis Orders: -     Ambulatory referral to Pain Clinic  Other orders -     oxyCODONE-acetaminophen (PERCOCET) 10-325 MG per tablet; Take 0.5-1 tablets by mouth every 8 (eight) hours as needed for pain.   Left lumbar radiculitis: I still encouraged her to follow through with the pain management clinic downstairs and that it's important to only take opiates only as prescribed. Recommended we switch from hydrocodone to a oxycodone product above since hydrocodone has been ineffective at a dose of 10 mg. I've also advised her that I feel it would be best for all parties involved if we found a pain management clinic that would help with injections and medication management. I think she keep her appointments downstairs to pursue injections  until we can hopefully get her established with Dr. Marlynn [facility]Malloy [facility]  Return if symptoms worsen or fail to improve.

## 2014-11-14 ENCOUNTER — Encounter: Payer: Self-pay | Admitting: Family Medicine

## 2014-11-14 NOTE — Telephone Encounter (Signed)
Reply

## 2014-11-15 ENCOUNTER — Telehealth: Payer: Self-pay | Admitting: Family Medicine

## 2014-11-15 MED ORDER — DULOXETINE HCL 20 MG PO CPEP: 20.0000 mg | ORAL_CAPSULE | Freq: Every day | ORAL | Status: DC

## 2014-11-15 NOTE — Telephone Encounter (Signed)
Left message on vm

## 2014-11-15 NOTE — Telephone Encounter (Signed)
Deanna Keller, Will you please let patient know that a Rx of generic Cymbalta has been sent to [facility] based on the letter [facility] sent her.  This replaces lyrica.

## 2014-11-24 ENCOUNTER — Encounter: Payer: Self-pay | Admitting: Family Medicine

## 2014-11-24 ENCOUNTER — Encounter: Payer: Self-pay | Admitting: Sports Medicine

## 2014-11-24 ENCOUNTER — Ambulatory Visit (INDEPENDENT_AMBULATORY_CARE_PROVIDER_SITE_OTHER): Payer: BLUE CROSS/BLUE SHIELD | Admitting: Sports Medicine

## 2014-11-24 VITALS — BP 111/79 | HR 73 | Ht 65.0 in | Wt 187.0 lb

## 2014-11-24 DIAGNOSIS — M5136 Other intervertebral disc degeneration, lumbar region: Secondary | ICD-10-CM

## 2014-11-24 DIAGNOSIS — IMO0001 Reserved for inherently not codable concepts without codable children: Secondary | ICD-10-CM

## 2014-11-24 DIAGNOSIS — M609 Myositis, unspecified: Secondary | ICD-10-CM

## 2014-11-24 DIAGNOSIS — M51369 Other intervertebral disc degeneration, lumbar region without mention of lumbar back pain or lower extremity pain: Secondary | ICD-10-CM

## 2014-11-24 DIAGNOSIS — M791 Myalgia: Secondary | ICD-10-CM | POA: Diagnosis not present

## 2014-11-24 DIAGNOSIS — Z30431 Encounter for routine checking of intrauterine contraceptive device: Secondary | ICD-10-CM

## 2014-11-24 MED ORDER — DULOXETINE HCL 60 MG PO CPEP: 60.0000 mg | ORAL_CAPSULE | Freq: Every day | ORAL | Status: DC

## 2014-11-24 MED ORDER — GABAPENTIN 600 MG PO TABS: ORAL_TABLET | ORAL | Status: DC

## 2014-11-24 MED ORDER — MELOXICAM 15 MG PO TABS: ORAL_TABLET | ORAL | Status: DC

## 2014-11-24 MED ORDER — METHOCARBAMOL 500 MG PO TABS: 500.0000 mg | ORAL_TABLET | Freq: Three times a day (TID) | ORAL | Status: DC

## 2014-11-24 NOTE — Assessment & Plan Note (Signed)
L5-S1 with a good response to her previous epidural. Pain today was predominantly referable to the sacroiliac joints, I injected both of these under ultrasound guidance, I am going to order her epidural, bilateral L5-S1 transforaminal but if her pain is completely gone after the SI joint injections she will cancel the epidural. I'm also going to revamp her entire regimen, we will increase Cymbalta to 60 mg, discontinue Prozac, switch from ibuprofen to meloxicam, increase gabapentin to 600 mg 3 times a day, place her into PT and refill her Robaxin. We did discuss that narcotics weren't appropriate for chronic back pain at this time.

## 2014-11-24 NOTE — Patient Instructions (Signed)
L5-S1 with a good response to her previous epidural. Pain today was predominantly referable to the sacroiliac joints, I injected both of these under ultrasound guidance, I am going to order her epidural, bilateral L5-S1 transforaminal but if her pain is completely gone after the SI joint injections she will cancel the epidural. I'm also going to revamp her entire regimen, we will increase Cymbalta to 60 mg, discontinue Prozac, one half tab daily for a week then stop, switch from ibuprofen to meloxicam, increase gabapentin to 600 mg 3 times a day, place her into PT and refill her Robaxin. We did discuss that narcotics weren't appropriate for chronic back pain at this time.

## 2014-11-24 NOTE — Progress Notes (Signed)
Subjective:    CC: Follow-up  HPI: I saw Deanna Keller a long time ago, we treated for lumbar degenerative disc disease, she had a bilateral L5-S1 transforaminal epidural that provided fantastic relief for several months, since then she has been to pain management, neurosurgery, and has had persistent pain. She is on oxycodone now, low-dose Cymbalta, as well as low-dose gabapentin. She has pain that is bilateral, predominantly axial with radiation into the buttock and posterior thighs. Moderate, persistent.   She also has a concern as to whether her Mirena is still in place.  Past medical history, Surgical history, Family history not pertinant except as noted below, Social history, Allergies, and medications have been entered into the medical record, reviewed, and no changes needed.   Review of Systems: No fevers, chills, night sweats, weight loss, chest pain, or shortness of breath.   Objective:    General: Well Developed, well nourished, and in no acute distress.  Neuro: Alert and oriented x3, extra-ocular muscles intact, sensation grossly intact.  HEENT: Normocephalic, atraumatic, pupils equal round reactive to light, neck supple, no masses, no lymphadenopathy, thyroid nonpalpable.  Skin: Warm and dry, no rashes. Cardiac: Regular rate and rhythm, no murmurs rubs or gallops, no lower extremity edema.  Respiratory: Clear to auscultation bilaterally. Not using accessory muscles, speaking in full sentences. Back Exam:  Inspection: Unremarkable  Motion: Flexion 45 deg, Extension 45 deg, Side Bending to 45 deg bilaterally,  Rotation to 45 deg bilaterally  SLR laying: Negative  XSLR laying: Negative  Palpable tenderness: Left and right sacroiliac joints. FABER: negative. Sensory change: Gross sensation intact to all lumbar and sacral dermatomes.  Reflexes: 2+ at both patellar tendons, 2+ at achilles tendons, Babinski's downgoing.  Strength at foot  Plantar-flexion: 5/5 Dorsi-flexion: 5/5  Eversion: 5/5 Inversion: 5/5  Leg strength  Quad: 5/5 Hamstring: 5/5 Hip flexor: 5/5 Hip abductors: 5/5  Gait unremarkable.  Procedure: Real-time Ultrasound Guided Injection of left sacroiliac joint Device: GE Logiq E  Verbal informed consent obtained.  Time-out conducted.  Noted no overlying erythema, induration, or other signs of local infection.  Skin prepped in a sterile fashion.  Local anesthesia: Topical Ethyl chloride.  With sterile technique and under real time ultrasound guidance:  Spinal needle advanced into the sacroiliac joint, taking care to avoid the S1 foramen I injected 1 mL Kenalog 40, 4 mL lidocaine. Completed without difficulty  Pain immediately resolved suggesting accurate placement of the medication.  Advised to call if fevers/chills, erythema, induration, drainage, or persistent bleeding.  Images permanently stored and available for review in the ultrasound unit.  Impression: Technically successful ultrasound guided injection.  Procedure: Real-time Ultrasound Guided Injection of right sacroiliac joint Device: GE Logiq E  Verbal informed consent obtained.  Time-out conducted.  Noted no overlying erythema, induration, or other signs of local infection.  Skin prepped in a sterile fashion.  Local anesthesia: Topical Ethyl chloride.  With sterile technique and under real time ultrasound guidance:  Spinal needle advanced into the sacroiliac joint, taking care to avoid the S1 foramen I injected 1 mL Kenalog 40, 4 mL lidocaine. Completed without difficulty  Pain immediately resolved suggesting accurate placement of the medication.  Advised to call if fevers/chills, erythema, induration, drainage, or persistent bleeding.  Images permanently stored and available for review in the ultrasound unit.  Impression: Technically successful ultrasound guided injection.  Procedure: Diagnostic Ultrasound of  abdomen Device: GE Logiq E  Findings: Noted full bladder, also noted  hyperechoic structure within the uterus consistent with  Mirena Images permanently stored and available for review in the ultrasound unit.  Impression: Hyperechoic structure in the uterus consistent with Mirena in place.  Impression and Recommendations:    I spent 40 minutes with this patient, greater than 50% was face-to-face time counseling regarding the above diagnoses

## 2014-11-24 NOTE — Assessment & Plan Note (Signed)
Limited ultrasound confirms position of Mirena IUD.

## 2014-11-29 ENCOUNTER — Encounter: Payer: Self-pay | Admitting: Sports Medicine

## 2014-11-30 ENCOUNTER — Ambulatory Visit (INDEPENDENT_AMBULATORY_CARE_PROVIDER_SITE_OTHER): Payer: BLUE CROSS/BLUE SHIELD | Admitting: Physical Therapy

## 2014-11-30 ENCOUNTER — Encounter: Payer: Self-pay | Admitting: Physical Therapy

## 2014-11-30 DIAGNOSIS — M5441 Lumbago with sciatica, right side: Secondary | ICD-10-CM | POA: Diagnosis not present

## 2014-11-30 DIAGNOSIS — R531 Weakness: Secondary | ICD-10-CM | POA: Diagnosis not present

## 2014-11-30 NOTE — Patient Instructions (Addendum)
Abdominal Bracing With Pelvic Floor (Hook-Lying)   With neutral spine, tighten pelvic floor and abdominals by drawing in the lower belly. Hold 5 seconds. Can perform buzzing sound to help get contraction. Repeat __10_ times. Do _1__ times a day.   Knee to Chest: Transverse Plane Stability    Hip External Rotation With Pillow: Transverse Plane Stability   One knee bent, one leg straight, on pillow. Slowly roll bent knee out. Be sure pelvis does not rotate. Do _10__ times. Restabilize pelvis. Repeat with other leg. Do _1-2__ sets, _1__ times per day.    Heel Slide: 4-10 Inches - Transverse Plane Stability [facility] at [facility] [address], [place] [address]  [phone] (office) [phone] (fax)  Pelvic Press   Place hands under belly between navel and pubic bone, palms up. Feel pressure on hands. Increase pressure on hands by pressing pelvis down. This is NOT a pelvic tilt. Hold _5__ seconds. Relax. Repeat 10___ times. Once a day.    HIP: Extension / KNEE: Flexion - Prone   Perform pelvic press and hold this while bending one knee. Perform  _10__ reps per set, _1__ sets per day, _1__ time a day.  Repeat on the other leg. Then perform with both knees bending and straightening at the same time.  Copyright  VHI. All rights reserved.   [facility] at [facility] [address] [place], [place] [address]  [phone] (office) [phone] (fax)

## 2014-11-30 NOTE — Therapy (Signed)
[facility] [place] [address] [place], [place], [address] Phone: [phone]   Fax:  [phone]  Physical Therapy Evaluation  Patient Details  Name: Deanna Keller MRN: [id number] Date of Birth: 1980/11/28 Referring Provider:  Monica Becton,*  Encounter Date: 11/30/2014      PT End of Session - 11/30/14 1004    Visit Number 1   Number of Visits 8   Date for PT Re-Evaluation 12/28/14   PT Start Time 0837   PT Stop Time 0922   PT Time Calculation (min) 45 min   Activity Tolerance Patient tolerated treatment well  had 50% reduction in pain after treatement      Past Medical History  Diagnosis Date  . Anxiety   . Alcohol abuse     sober since 2011    Past Surgical History  Procedure Laterality Date  . Gastric bypass  2005  . Cervical cone biopsy      There were no vitals filed for this visit.  Visit Diagnosis:  Right-sided low back pain with right-sided sciatica - Plan: PT plan of care cert/re-cert  Weakness generalized - Plan: PT plan of care cert/re-cert      Subjective Assessment - 11/30/14 0933    Subjective Pt reports she developed LBP during her second trimester of pregancy about 18 months ago.  During delivery she was told she ruptured L5 disc and she has had pain ever since.    Pertinent History Pt has been to the pain clinic and had injections.  She wishes to try PT as she doesn't want to only be on medication.  She has been using heat at home with some relief and has a home TENS unit. She had gastric bypass in 2005 and used to be very active. Wishes to do this again.    How long can you sit comfortably? 20'   Diagnostic tests x-rays show degenerative changes   Patient Stated Goals decrease pain, exercise again and care for her baby better   Currently in Pain? Yes   Pain Score 8    Pain Location Buttocks   Pain Orientation Right   Pain Descriptors / Indicators Aching;Stabbing;Sharp;Radiating    Pain Type Chronic pain   Pain Radiating Towards into Rt posterior thigh and occassionally down to her toes   Pain Onset Other (comment)  > 1 yr   Pain Frequency Constant   Aggravating Factors  sitting   Pain Relieving Factors medication and heat            [facility] PT Assessment - 11/30/14 0001    Assessment   Medical Diagnosis Lumbar DDD   Onset Date/Surgical Date 06/01/13   Hand Dominance Right   Next MD Visit 12/30/14   Prior Therapy none   Precautions   Precautions None   Balance Screen   Has the patient fallen in the past 6 months No   Has the patient had a decrease in activity level because of a fear of falling?  No   Is the patient reluctant to leave their home because of a fear of falling?  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 extra abdominal girth   ROM / Strength   AROM / PROM / Strength AROM;Strength   AROM   AROM Assessment Site Lumbar;Hip   Right/Left Hip --  hypermobile rotation bilat hips   Lumbar Flexion to knees, with pulling behing knees and back   Lumbar Extension decreased 25%   Lumbar - Right Side Bend WNL   Lumbar - Left Side Bend WNL   Lumbar - Right Rotation WNL, pain in Rt buttock   Lumbar - Left Rotation WNL   Strength   Overall Strength Comments bilat knees/ankles WNL   Strength Assessment Site Hip;Lumbar   Right/Left Hip Right;Left   Right Hip Flexion 4-/5   Right Hip Extension 4/5   Right Hip ABduction 4+/5   Left Hip Flexion 4-/5   Left Hip Extension 4/5   Left Hip ABduction 4/5    Lumbar Flexion --  TA poor   Lumbar Extension --  multifidis fair (-)    Flexibility   Soft Tissue Assessment /Muscle Length --  good LE flexibility, however pt reports she feels tight   Palpation   Spinal mobility pain with CPA mobs L5 & L2, UPA mobs Rt/Lt L5   SI assessment  WNL   Palpation comment tightness & tender in Rt piriformis, tender in Rt gluts and lumbar paraspinals   Special Tests    Special Tests Lumbar   Lumbar Tests Slump Test;Straight Leg Raise   Slump test   Findings Negative   Straight Leg Raise   Findings Negative                   [facility] Adult PT Treatment/Exercise - 11/30/14 0001    Self-Care   Self-Care --  use of pillow for sleeping position and home TENs unit    Exercises   Exercises Lumbar   Lumbar Exercises: Supine   Ab Set 10 reps;5 seconds   Clam 10 reps  with TA contraction   Lumbar Exercises: Prone   Other Prone Lumbar Exercises 10 reps pelvic press   Other Prone Lumbar Exercises 10 reps each knee flex/ext, bilat knee flex/ext with pelvic press   Manual Therapy   Manual Therapy Joint mobilization   Joint Mobilization Grade III mobs Rt UPA L5 multiple bouts, reduced pain 50%                PT Education - 11/30/14 1003    Education provided Yes   Education Details HEP, sleeping with pillow between knees and TENs electrode placement to stim piriformis   Person(s) Educated Patient   Methods Explanation;Demonstration;Handout   Comprehension Returned demonstration;Verbalized understanding             PT Long Term Goals - 11/30/14 1010    PT LONG TERM GOAL #1   Title I with advanced HEP ( 12/28/14)   Time 4   Period Weeks   Status New   PT LONG TERM GOAL #2   Title report =/> 75% reduction in Rt buttock/LE pain (12/28/14)    Time 4   Period Weeks   Status New   PT LONG TERM GOAL #3   Title increase strength bilat hips =/> 5-/5 throughout ( 12/28/14)    Time 4   Period Weeks   Status New   PT LONG TERM GOAL  #4   Title tolerate sitting =/> 60' without increased pain ( 12/28/14)  Time 4   Period Weeks   Status New   PT LONG TERM GOAL #5   Title improve FOTO =/< 44% limited               Plan - 11/30/14 1007    Clinical Impression Statement 34 yo female presents with long h/o low back, Rt buttock and LE pain ever since her pregnancy 18 months ago. She is very deconditioned, weak through both her hips and core.  She also has Rt piriformis tightness that is most likely compressing on to sciatic nerve.  She tends to hold her baby on the Rt hip as well.    Pt will benefit from skilled therapeutic intervention in order to improve on the following deficits Improper body mechanics;Postural dysfunction;Decreased strength;Decreased range of motion;Pain   Rehab Potential Excellent   PT Frequency 2x / week   PT Duration 4 weeks   PT Treatment/Interventions ADLs/Self Care Home Management;Manual techniques;Cryotherapy;Electrical Stimulation;Therapeutic exercise;Neuromuscular re-education;Passive range of motion;Moist Heat;Traction;Ultrasound;Patient/family education   PT Next Visit Plan progess core exercise, TPR to Rt piriformis   Consulted and Agree with Plan of Care Patient         Problem List Patient Active Problem List   Diagnosis Date Noted  . IUD check up 11/24/2014  . Left lumbar radiculitis 10/11/2014  . Generalized seizure 08/30/2014  . Lumbar degenerative disc disease 06/20/2014  . Recovering alcoholic in remission 06/16/2014  . S/P gastric bypass 11/11/2013  . Vitamin D deficiency 11/11/2013  . Depression 11/10/2013  . Elevated LFTs 11/10/2013  . Chronic migraine 11/10/2013  . Malabsorption 11/10/2013  . History of alcoholism 07/21/2013  . B12 deficiency 07/21/2013  . Iron deficiency 07/21/2013  . Hypoglycemia following gastrointestinal surgery 04/12/2013  . Bariatric surgery status in pregnancy 02/10/2013  . Phlebectasia 10/21/2011    Roderic Scarce PT 11/30/2014,  10:17 AM  [facility] [address] [place], [place], [address] Phone: [phone]   Fax:  [phone]

## 2014-12-05 ENCOUNTER — Other Ambulatory Visit: Payer: Self-pay

## 2014-12-05 ENCOUNTER — Other Ambulatory Visit: Payer: Self-pay | Admitting: Sports Medicine

## 2014-12-05 ENCOUNTER — Ambulatory Visit
Admission: RE | Admit: 2014-12-05 | Discharge: 2014-12-05 | Disposition: A | Discharge status: Home / Self Care | Payer: BLUE CROSS/BLUE SHIELD | Source: Ambulatory Visit | Attending: Sports Medicine | Admitting: Sports Medicine

## 2014-12-05 ENCOUNTER — Encounter: Payer: BLUE CROSS/BLUE SHIELD | Admitting: Physical Therapy

## 2014-12-05 VITALS — BP 112/67 | HR 67

## 2014-12-05 DIAGNOSIS — M5136 Other intervertebral disc degeneration, lumbar region: Secondary | ICD-10-CM

## 2014-12-05 DIAGNOSIS — M5416 Radiculopathy, lumbar region: Secondary | ICD-10-CM

## 2014-12-05 MED ORDER — IOHEXOL 180 MG/ML  SOLN
1.0000 mL | Freq: Once | INTRAMUSCULAR | Status: DC | PRN
  Administered 2014-12-05: 1 mL via EPIDURAL

## 2014-12-05 MED ORDER — METHYLPREDNISOLONE ACETATE 40 MG/ML INJ SUSP (RADIOLOG
120.0000 mg | Freq: Once | INTRAMUSCULAR | Status: AC
  Administered 2014-12-05: 120 mg via EPIDURAL

## 2014-12-05 NOTE — Discharge Instructions (Signed)

## 2014-12-06 ENCOUNTER — Encounter: Payer: BLUE CROSS/BLUE SHIELD | Admitting: Physical Therapy

## 2014-12-06 ENCOUNTER — Inpatient Hospital Stay: Admission: RE | Admit: 2014-12-06 | Payer: BLUE CROSS/BLUE SHIELD | Source: Ambulatory Visit

## 2014-12-09 ENCOUNTER — Ambulatory Visit (INDEPENDENT_AMBULATORY_CARE_PROVIDER_SITE_OTHER): Payer: BLUE CROSS/BLUE SHIELD | Admitting: Physical Therapy

## 2014-12-09 DIAGNOSIS — M5441 Lumbago with sciatica, right side: Secondary | ICD-10-CM | POA: Diagnosis not present

## 2014-12-09 DIAGNOSIS — R531 Weakness: Secondary | ICD-10-CM

## 2014-12-09 NOTE — Patient Instructions (Signed)
  Abdominal Bracing With Pelvic Floor (Hook-Lying)  Hip External Rotation With Pillow: Transverse Plane Stability  (Can keep both knee knees bent) One knee bent, one leg straight, on pillow. Slowly roll bent knee out. Be sure pelvis does not rotate. Do _10__ times. Restabilize pelvis. Repeat with other leg. Do _1-2__ sets, _1__ times per day.  Hamstring Step 4   Left leg and foot in maximal stretch. Slowly lengthen and press other leg down as close to floor as possible. Keep lower abdominals tight. Warning: Intense stretch. Stay within tolerance. Hold _30__ seconds. Relax lengthened leg slightly. Do not re-bend knee. Repeat press and lengthen ___ times.  Piriformis Stretch, Sitting   Sit, one ankle on opposite knee, same-side hand on crossed knee. Push down on knee, keeping spine straight. Lean torso forward, with flat back, until tension is felt in hamstrings and gluteals of crossed-leg side. Hold _30_ seconds.  Repeat _2__ times per session. Do _2__ sessions per day.   [facility] [place] at [facility] [address] [place], [place] [address]  [phone] (office) [phone] (fax)

## 2014-12-09 NOTE — Therapy (Addendum)
[facility] [address] [place], [place], [address] Phone: [phone]   Fax:  [phone]  Physical Therapy Treatment  Patient Details  Name: Deanna Keller MRN: [id number] Date of Birth: 1980/09/22 Referring Provider:  Silverio Decamp,*  Encounter Date: 12/09/2014      PT End of Session - 12/09/14 0819    Visit Number 2   Number of Visits 8   Date for PT Re-Evaluation 12/28/14   PT Start Time 0802   PT Stop Time 0919   PT Time Calculation (min) 77 min   Activity Tolerance Patient tolerated treatment well      Past Medical History  Diagnosis Date  . Anxiety   . Alcohol abuse     sober since 2011    Past Surgical History  Procedure Laterality Date  . Gastric bypass  2005  . Cervical cone biopsy      There were no vitals filed for this visit.  Visit Diagnosis:  Right-sided low back pain with right-sided sciatica  Weakness generalized      Subjective Assessment - 12/09/14 0805    Subjective Pt reports she had epidural 5 days ago in bilateral L5.  Reports she had reduction of symptoms last treatment (with joint mobs), but was  sore 2 days following.  Has been performing HEP.    Patient Stated Goals decrease pain, exercise again and care for her baby better   Currently in Pain? Yes   Pain Score 4    Pain Location Back   Pain Orientation Right   Pain Descriptors / Indicators Tender   Pain Radiating Towards into Rt buttock   Aggravating Factors  sitting    Pain Relieving Factors laying down, medication.             [facility] PT Assessment - 12/09/14 0001    Assessment   Medical Diagnosis Lumbar DDD   Onset Date/Surgical Date 06/01/13   Hand Dominance Right   Next MD Visit 12/30/14   Prior Therapy none          [facility] Adult PT Treatment/Exercise - 12/09/14 0001    Self-Care   Self-Care Lifting;Heat/Ice Application;Other Self-Care Comments   Lifting --  body mechanics with lifting and  bending.    Heat/Ice Application Educated on parameters and benefits of ice/heat   Other Self-Care Comments  educated on log roll, engaging core with transitions,    Lumbar Exercises: Stretches   Passive Hamstring Stretch 30 seconds;3 reps  seated x 2; supine with strap x 1 (each leg)   Piriformis Stretch 30 seconds;3 reps  each leg   Piriformis Stretch Limitations (pt shown supine and seated forms)   Lumbar Exercises: Aerobic   Elliptical L2: 3.5 min    Lumbar Exercises: Standing   Other Standing Lumbar Exercises Doorway pec stretch: low, midlevel, and high arm position x 20 sec each (bilat)   Lumbar Exercises: Supine   Ab Set 5 seconds;20 reps   AB Set Limitations required tactile and VC for TA to remain engaged.    Clam 10 reps  with TA contraction,  each leg   Clam Limitations difficulty engaging core and maintaining contraction.    Lumbar Exercises: Prone   Opposite Arm/Leg Raise Right arm/Left leg;Left arm/Right leg;5 reps  each side    Other Prone Lumbar Exercises 10 reps pelvic press  5-10 sec    Other Prone Lumbar Exercises 10 reps each knee flex/ext, bilat knee flex/ext with pelvic press   Modalities  Modalities Electrical Stimulation;Cryotherapy   Cryotherapy   Number Minutes Cryotherapy 15 Minutes   Cryotherapy Location Lumbar Spine   Type of Cryotherapy Ice pack   Electrical Stimulation   Electrical Stimulation Location Rt pirifomis, Rt/Lt SI joint   Electrical Stimulation Action IFC   Electrical Stimulation Parameters to tolerance, 15 min    Electrical Stimulation Goals Pain   Manual Therapy   Manual Therapy Joint mobilization;Soft tissue mobilization   Manual therapy comments Joint mobs performed by supervising PT, Celyn Holt.    Joint Mobilization PA glide L4-L5, L5-S1, lateral mob to L5-S1, L4-L5;  PA caudle mobs to lateral sacral border Rt (tender)   Soft tissue mobilization to Rt paraspinals, Rt piriformis             PT Long Term Goals - 11/30/14  1010    PT LONG TERM GOAL #1   Title I with advanced HEP ( 12/28/14)   Time 4   Period Weeks   Status New   PT LONG TERM GOAL #2   Title report =/> 75% reduction in Rt buttock/LE pain (12/28/14)    Time 4   Period Weeks   Status New   PT LONG TERM GOAL #3   Title increase strength bilat hips =/> 5-/5 throughout ( 12/28/14)    Time 4   Period Weeks   Status New   PT LONG TERM GOAL #4   Title tolerate sitting =/> 60' without increased pain ( 12/28/14)    Time 4   Period Weeks   Status New   PT LONG TERM GOAL #5   Title improve FOTO =/< 44% limited               Plan - 12/09/14 6389    Clinical Impression Statement Pt tolerated exercises well, without increase in LBP. Pt required multiple cues for engagement of TA muscles during exercise.  Pt reported reduction in symptoms after manual therapy and estim/ice.  No goals met, only second visit.    Pt will benefit from skilled therapeutic intervention in order to improve on the following deficits Improper body mechanics;Postural dysfunction;Decreased strength;Decreased range of motion;Pain   Rehab Potential Excellent   PT Frequency 2x / week   PT Duration 4 weeks   PT Treatment/Interventions ADLs/Self Care Home Management;Manual techniques;Cryotherapy;Electrical Stimulation;Therapeutic exercise;Neuromuscular re-education;Passive range of motion;Moist Heat;Traction;Ultrasound;Patient/family education   PT Next Visit Plan progess core exercise, TPR to Rt piriformis   Consulted and Agree with Plan of Care Patient        Problem List Patient Active Problem List   Diagnosis Date Noted  . IUD check up 11/24/2014  . Left lumbar radiculitis 10/11/2014  . Generalized seizure 08/30/2014  . Lumbar degenerative disc disease 06/20/2014  . Recovering alcoholic in remission 37/34/2876  . S/P gastric bypass 11/11/2013  . Vitamin D deficiency 11/11/2013  . Depression 11/10/2013  . Elevated LFTs 11/10/2013  . Chronic migraine 11/10/2013   . Malabsorption 11/10/2013  . History of alcoholism 07/21/2013  . B12 deficiency 07/21/2013  . Iron deficiency 07/21/2013  . Hypoglycemia following gastrointestinal surgery 04/12/2013  . Postoperative blind loop syndrome 04/12/2013  . Bariatric surgery status in pregnancy 02/10/2013  . Phlebectasia 10/21/2011   Kerin Perna, PTA 12/09/2014 9:24 AM  [facility] [address] [place], [place], [address] Phone: [phone]   Fax:  [phone]     PHYSICAL THERAPY DISCHARGE SUMMARY  Visits from Start of Care: 2  Current functional level related to goals /  functional outcomes: unknown   Remaining deficits: unknown   Education / Equipment: Initial HEP  Plan:                                                    Patient goals were not met. Patient is being discharged due to not returning since the last visit.  ?????       Jeral Pinch, PT 01/10/2015 9:23 AM

## 2014-12-10 ENCOUNTER — Other Ambulatory Visit: Payer: Self-pay | Admitting: Family Medicine

## 2014-12-14 ENCOUNTER — Encounter: Payer: BLUE CROSS/BLUE SHIELD | Admitting: Physical Therapy

## 2014-12-20 ENCOUNTER — Ambulatory Visit (INDEPENDENT_AMBULATORY_CARE_PROVIDER_SITE_OTHER): Payer: BLUE CROSS/BLUE SHIELD | Admitting: Family Medicine

## 2014-12-20 ENCOUNTER — Encounter: Payer: Self-pay | Admitting: Family Medicine

## 2014-12-20 ENCOUNTER — Ambulatory Visit (INDEPENDENT_AMBULATORY_CARE_PROVIDER_SITE_OTHER): Payer: BLUE CROSS/BLUE SHIELD

## 2014-12-20 VITALS — BP 117/77 | HR 101 | Temp 98.9°F | Wt 183.0 lb

## 2014-12-20 DIAGNOSIS — J069 Acute upper respiratory infection, unspecified: Secondary | ICD-10-CM

## 2014-12-20 DIAGNOSIS — R05 Cough: Secondary | ICD-10-CM | POA: Diagnosis not present

## 2014-12-20 DIAGNOSIS — R059 Cough, unspecified: Secondary | ICD-10-CM

## 2014-12-20 LAB — POCT INFLUENZA A/B
Influenza A, POC: NEGATIVE
Influenza B, POC: NEGATIVE

## 2014-12-20 MED ORDER — IPRATROPIUM BROMIDE 0.06 % NA SOLN: 2.0000 | Freq: Four times a day (QID) | NASAL | Status: DC

## 2014-12-20 NOTE — Progress Notes (Signed)
Deanna Keller is a 34 y.o. female who presents to [facility]  today for fatigue and body aches. Symptoms present for 4 days. Patient also noted some coughing congestion. She's tried multiple over-the-counter medicines which helps some. The most dominant symptom for her is fatigued. She's concerned she may have fluid. She has a mild headache as well. No vomiting diarrhea chest pains or palpitations or shortness of breath.   Past Medical History  Diagnosis Date  . Anxiety   . Alcohol abuse     sober since 2011   Past Surgical History  Procedure Laterality Date  . Gastric bypass  2005  . Cervical cone biopsy     Social History  Substance Use Topics  . Smoking status: Former Smoker -- 0.50 packs/day for 5 years    Types: Cigarettes    Start date: 07/21/2012  . Smokeless tobacco: Never Used  . Alcohol Use: No   family history includes Colon cancer in her mother; Depression in her mother; Hypertension in her father.  ROS as above Medications: Current Outpatient Prescriptions  Medication Sig Dispense Refill  . cyanocobalamin (,VITAMIN B-12,) 1000 MCG/ML injection INJECT 1 ML EVERY OTHER WEEK 15 mL 11  . Diclofenac Sodium 1.5 % SOLN APPLY MUP TO 40 DROPS TO AFFECTED AREA 3-4 TIMES DAILY AS NEEDED  1  . DULoxetine (CYMBALTA) 20 MG capsule TAKE 1 CAPSULE (20 MG TOTAL) BY MOUTH DAILY. FOR NERVE IMPINGEMENT PAIN.  2  . FLUoxetine (PROZAC) 20 MG tablet TAKE 1 TABLET (20 MG TOTAL) BY MOUTH DAILY.  3  . gabapentin (NEURONTIN) 600 MG tablet 1 tab PO TID 90 tablet 3  . IRON PO Take by mouth 2 (two) times daily.    [name] levETIRAcetam (KEPPRA) 500 MG tablet Take 1 tablet (500 mg total) by mouth 2 (two) times daily. 60 tablet 2  . lidocaine (XYLOCAINE) 5 % ointment APPLY 1 TO 2 GRAS TO AFFECTEED AREA 3-4 TIMES DAILY AS NEEDED  1  . meloxicam (MOBIC) 15 MG tablet One tab PO qAM with breakfast for 2 weeks, then daily prn pain. 30 tablet 3  . methocarbamol (ROBAXIN)  500 MG tablet Take 1 tablet (500 mg total) by mouth 3 (three) times daily. 90 tablet 0  . oxyCODONE-acetaminophen (PERCOCET) 10-325 MG per tablet Take 0.5-1 tablets by mouth every 8 (eight) hours as needed for pain. 60 tablet 0  . triamcinolone cream (KENALOG) 0.1 % Apply 1 application topically 2 (two) times daily. (Allergic reaction) 30 g 0  . ipratropium (ATROVENT) 0.06 % nasal spray Place 2 sprays into both nostrils 4 (four) times daily. 15 mL 1   No current facility-administered medications for this visit.   Allergies  Allergen Reactions  . Tramadol Other (See Comments)    Possible seizure     Exam:  BP 117/77 mmHg  Pulse 101  Temp(Src) 98.9 F (37.2 C) (Oral)  Wt 183 lb (83.008 kg)  SpO2 99% Gen: Well NAD HEENT: EOMI,  MMM clear nasal discharge. Posterior pharynx with cobblestoning. Normal tympanic membranes bilaterally Lungs: Normal work of breathing. CTABL Heart: Mild tachycardia regular rhythm no MRG Abd: NABS, Soft. Nondistended, Nontender Exts: Brisk capillary refill, warm and well perfused.   Results for orders placed or performed in visit on 12/20/14 (from the past 24 hour(s))  POCT Influenza A/B     Status: None   Collection Time: 12/20/14  3:12 PM  Result Value Ref Range   Influenza A, POC Negative Negative  Influenza B, POC Negative Negative   Dg Chest 2 View  12/20/2014   CLINICAL DATA:  One week of cough, former smoker.  EXAM: CHEST  2 VIEW  COMPARISON:  None.  FINDINGS: The lungs are mildly hyperinflated and clear. The heart and pulmonary vascularity are normal. The mediastinum is normal in width. There is no pleural effusion. The bony thorax is unremarkable.  IMPRESSION: There is no active cardiopulmonary disease.   Electronically Signed   By: David  [place] M.D.   On: 12/20/2014 15:41     Please see individual assessment and plan sections.

## 2014-12-20 NOTE — Assessment & Plan Note (Signed)
Symptomsconsistent with viral URI. Chest x-ray pending. Treatment with Atrovent nasal spray and over-the-counter medications. Return if not better.

## 2014-12-20 NOTE — Patient Instructions (Signed)
Thank you for coming in today. Continue ibuprofen. Continue over-the-counter medicines. Use Atrovent nasal spray. Get an x-ray today. Return if not improving. Call or go to the emergency room if you get worse, have trouble breathing, have chest pains, or palpitations.   Upper Respiratory Infection, Adult An upper respiratory infection (URI) is also sometimes known as the common cold. The upper respiratory tract includes the nose, sinuses, throat, trachea, and bronchi. Bronchi are the airways leading to the lungs. Most people improve within 1 week, but symptoms can last up to 2 weeks. A residual cough may last even longer.  CAUSES Many different viruses can infect the tissues lining the upper respiratory tract. The tissues become irritated and inflamed and often become very moist. Mucus production is also common. A cold is contagious. You can easily spread the virus to others by oral contact. This includes kissing, sharing a glass, coughing, or sneezing. Touching your mouth or nose and then touching a surface, which is then touched by another person, can also spread the virus. SYMPTOMS  Symptoms typically develop 1 to 3 days after you come in contact with a cold virus. Symptoms vary from person to person. They may include:  Runny nose.  Sneezing.  Nasal congestion.  Sinus irritation.  Sore throat.  Loss of voice (laryngitis).  Cough.  Fatigue.  Muscle aches.  Loss of appetite.  Headache.  Low-grade fever. DIAGNOSIS  You might diagnose your own cold based on familiar symptoms, since most people get a cold 2 to 3 times a year. Your caregiver can confirm this based on your exam. Most importantly, your caregiver can check that your symptoms are not due to another disease such as strep throat, sinusitis, pneumonia, asthma, or epiglottitis. Blood tests, throat tests, and X-rays are not necessary to diagnose a common cold, but they may sometimes be helpful in excluding other more  serious diseases. Your caregiver will decide if any further tests are required. RISKS AND COMPLICATIONS  You may be at risk for a more severe case of the common cold if you smoke cigarettes, have chronic heart disease (such as heart failure) or lung disease (such as asthma), or if you have a weakened immune system. The very young and very old are also at risk for more serious infections. Bacterial sinusitis, middle ear infections, and bacterial pneumonia can complicate the common cold. The common cold can worsen asthma and chronic obstructive pulmonary disease (COPD). Sometimes, these complications can require emergency medical care and may be life-threatening. PREVENTION  The best way to protect against getting a cold is to practice good hygiene. Avoid oral or hand contact with people with cold symptoms. Wash your hands often if contact occurs. There is no clear evidence that vitamin C, vitamin E, echinacea, or exercise reduces the chance of developing a cold. However, it is always recommended to get plenty of rest and practice good nutrition. TREATMENT  Treatment is directed at relieving symptoms. There is no cure. Antibiotics are not effective, because the infection is caused by a virus, not by bacteria. Treatment may include:  Increased fluid intake. Sports drinks offer valuable electrolytes, sugars, and fluids.  Breathing heated mist or steam (vaporizer or shower).  Eating chicken soup or other clear broths, and maintaining good nutrition.  Getting plenty of rest.  Using gargles or lozenges for comfort.  Controlling fevers with ibuprofen or acetaminophen as directed by your caregiver.  Increasing usage of your inhaler if you have asthma. Zinc gel and zinc lozenges, taken in  the first 24 hours of the common cold, can shorten the duration and lessen the severity of symptoms. Pain medicines may help with fever, muscle aches, and throat pain. A variety of non-prescription medicines are  available to treat congestion and runny nose. Your caregiver can make recommendations and may suggest nasal or lung inhalers for other symptoms.  HOME CARE INSTRUCTIONS   Only take over-the-counter or prescription medicines for pain, discomfort, or fever as directed by your caregiver.  Use a warm mist humidifier or inhale steam from a shower to increase air moisture. This may keep secretions moist and make it easier to breathe.  Drink enough water and fluids to keep your urine clear or pale yellow.  Rest as needed.  Return to work when your temperature has returned to normal or as your caregiver advises. You may need to stay home longer to avoid infecting others. You can also use a face mask and careful hand washing to prevent spread of the virus. SEEK MEDICAL CARE IF:   After the first few days, you feel you are getting worse rather than better.  You need your caregiver's advice about medicines to control symptoms.  You develop chills, worsening shortness of breath, or brown or red sputum. These may be signs of pneumonia.  You develop yellow or brown nasal discharge or pain in the face, especially when you bend forward. These may be signs of sinusitis.  You develop a fever, swollen neck glands, pain with swallowing, or white areas in the back of your throat. These may be signs of strep throat. SEEK IMMEDIATE MEDICAL CARE IF:   You have a fever.  You develop severe or persistent headache, ear pain, sinus pain, or chest pain.  You develop wheezing, a prolonged cough, cough up blood, or have a change in your usual mucus (if you have chronic lung disease).  You develop sore muscles or a stiff neck. Document Released: 09/25/2000 Document Revised: 06/24/2011 Document Reviewed: 07/07/2013 [facility] Patient Information 2015 [place], [place]. This information is not intended to replace advice given to you by your health care provider. Make sure you discuss any questions you have with your  health care provider.

## 2014-12-20 NOTE — Progress Notes (Signed)
Quick Note:  No pneumonia ______ 

## 2014-12-22 ENCOUNTER — Encounter: Payer: Self-pay | Admitting: Family Medicine

## 2014-12-22 ENCOUNTER — Ambulatory Visit: Payer: BLUE CROSS/BLUE SHIELD | Admitting: Sports Medicine

## 2014-12-22 MED ORDER — AZITHROMYCIN 250 MG PO TABS: 250.0000 mg | ORAL_TABLET | Freq: Every day | ORAL | Status: DC

## 2014-12-22 MED ORDER — PREDNISONE 10 MG PO TABS: 30.0000 mg | ORAL_TABLET | Freq: Every day | ORAL | Status: DC

## 2014-12-22 NOTE — Telephone Encounter (Signed)
Patient feeling better after one week. We'll send and prednisone and azithromycin. Patient will return to clinic if no better.

## 2014-12-23 ENCOUNTER — Telehealth: Payer: Self-pay | Admitting: Family Medicine

## 2014-12-23 MED ORDER — BENZONATATE 200 MG PO CAPS: 200.0000 mg | ORAL_CAPSULE | Freq: Three times a day (TID) | ORAL | Status: DC | PRN

## 2014-12-23 MED ORDER — GUAIFENESIN-CODEINE 100-10 MG/5ML PO SYRP: 5.0000 mL | ORAL_SOLUTION | Freq: Every evening | ORAL | Status: DC | PRN

## 2014-12-23 NOTE — Telephone Encounter (Signed)
Agree to rx tessalon

## 2014-12-23 NOTE — Addendum Note (Signed)
Addended by: Rodolph Bong on: 12/23/2014 08:10 AM   Modules accepted: Orders

## 2014-12-23 NOTE — Telephone Encounter (Signed)
req cough medicine

## 2014-12-25 ENCOUNTER — Other Ambulatory Visit: Payer: Self-pay | Admitting: Family Medicine

## 2014-12-25 ENCOUNTER — Telehealth: Payer: Self-pay | Admitting: Family Medicine

## 2014-12-26 ENCOUNTER — Ambulatory Visit (INDEPENDENT_AMBULATORY_CARE_PROVIDER_SITE_OTHER): Payer: BLUE CROSS/BLUE SHIELD | Admitting: Family Medicine

## 2014-12-26 ENCOUNTER — Encounter: Payer: Self-pay | Admitting: Family Medicine

## 2014-12-26 VITALS — BP 125/70 | HR 103 | Temp 98.6°F | Wt 194.0 lb

## 2014-12-26 DIAGNOSIS — A499 Bacterial infection, unspecified: Secondary | ICD-10-CM | POA: Diagnosis not present

## 2014-12-26 DIAGNOSIS — B9689 Other specified bacterial agents as the cause of diseases classified elsewhere: Secondary | ICD-10-CM

## 2014-12-26 DIAGNOSIS — J329 Chronic sinusitis, unspecified: Secondary | ICD-10-CM | POA: Diagnosis not present

## 2014-12-26 MED ORDER — HYDROCODONE-HOMATROPINE 5-1.5 MG/5ML PO SYRP: 5.0000 mL | ORAL_SOLUTION | Freq: Three times a day (TID) | ORAL | Status: DC | PRN

## 2014-12-26 MED ORDER — PREDNISONE 20 MG PO TABS: ORAL_TABLET | ORAL | Status: AC

## 2014-12-26 MED ORDER — LEVOFLOXACIN 500 MG PO TABS: 500.0000 mg | ORAL_TABLET | Freq: Every day | ORAL | Status: DC

## 2014-12-26 NOTE — Progress Notes (Signed)
Stressed the importance of ensuring she takes keppra while on levaquin and until she follows up with her neurologist.

## 2014-12-26 NOTE — Progress Notes (Signed)
CC: Deanna Keller is a 34 y.o. female is here for Cough   Subjective: HPI:  Cough for almost 2 weeks that has been accompanied by facial pressure and nasal congestion with postnasal drip. Symptoms are moderate in severity and have been worsening on a daily basis. Symptoms are significantly interfering with sleep and did not improve with a codeine-containing cough syrup given to her last week. She is now finished 5 days of prednisone and azithromycin without much benefit. She reports that her cough is no longer productive, now dry. She reports questionable wheezing. She reports subjective fevers and chills. Symptoms can be present anytime of the day. She denies shortness of breath, blood in sputum, confusion, rash, difficulty swallowing, or joint pain.   Review Of Systems Outlined In HPI  Past Medical History  Diagnosis Date  . Anxiety   . Alcohol abuse     sober since 2011    Past Surgical History  Procedure Laterality Date  . Gastric bypass  2005  . Cervical cone biopsy     Family History  Problem Relation Age of Onset  . Colon cancer Mother   . Depression Mother   . Hypertension Father     Social History   Social History  . Marital Status: Married    Spouse Name: N/A  . Number of Children: N/A  . Years of Education: N/A   Occupational History  . Not on file.   Social History Main Topics  . Smoking status: Former Smoker -- 0.50 packs/day for 5 years    Types: Cigarettes    Start date: 07/21/2012  . Smokeless tobacco: Never Used  . Alcohol Use: No  . Drug Use: No  . Sexual Activity:    Partners: Male   Other Topics Concern  . Not on file   Social History Narrative     Objective: BP 125/70 mmHg  Pulse 103  Temp(Src) 98.6 F (37 C) (Oral)  Wt 194 lb (87.998 kg)  SpO2 98%  General: Alert and Oriented, No Acute Distress HEENT: Pupils equal, round, reactive to light. Conjunctivae clear.  External ears unremarkable, canals clear with intact TMs with  appropriate landmarks.  Middle ear appears open without effusion. Pink inferior turbinates.  Moist mucous membranes, pharynx without lesions other than moderate cobblestoning and postnasal drip.  Neck supple without palpable lymphadenopathy nor abnormal masses. Lungs: Clear to auscultation bilaterally, no wheezing/ronchi/rales.  Comfortable work of breathing. Good air movement. Cardiac: Regular rate and rhythm. Normal S1/S2.  No murmurs, rubs, nor gallops.   Mental Status: No depression, anxiety, nor agitation. Skin: Warm and dry.  Assessment & Plan: Deanna Keller was seen today for cough.  Diagnoses and all orders for this visit:  Bacterial sinusitis -     levofloxacin (LEVAQUIN) 500 MG tablet; Take 1 tablet (500 mg total) by mouth daily. -     predniSONE (DELTASONE) 20 MG tablet; Three tabs at once daily for five days.  Other orders -     HYDROcodone-homatropine (HYCODAN) 5-1.5 MG/5ML syrup; Take 5 mLs by mouth every 8 (eight) hours as needed for cough.   Bacterial sinusitis: Hycodan provided to help with cough due to postnasal drip from sinusitis, begin Levaquin and prednisone. Call if no better by Thursday.  Return if symptoms worsen or fail to improve.

## 2014-12-26 NOTE — Telephone Encounter (Signed)
Deanna Keller, Based on Dr. Zollie Pee last note and Angela's advice a follow up appt is needed if not improving.

## 2014-12-26 NOTE — Telephone Encounter (Signed)
Left voicemail to see how Pt was doing at this time and schedule follow up appt if needed. No answer, callback information provided.

## 2014-12-27 ENCOUNTER — Ambulatory Visit: Payer: BLUE CROSS/BLUE SHIELD | Admitting: Sports Medicine

## 2014-12-29 ENCOUNTER — Telehealth: Payer: Self-pay | Admitting: Family Medicine

## 2014-12-30 MED ORDER — HYDROCODONE-HOMATROPINE 5-1.5 MG/5ML PO SYRP: 5.0000 mL | ORAL_SOLUTION | Freq: Three times a day (TID) | ORAL | Status: DC | PRN

## 2014-12-30 NOTE — Telephone Encounter (Signed)
Andrea, Rx placed in in-box ready for pickup/faxing.  

## 2014-12-30 NOTE — Addendum Note (Signed)
Addended by: Laren Boom on: 12/30/2014 01:05 PM   Modules accepted: Orders

## 2014-12-30 NOTE — Telephone Encounter (Signed)
Pt notified and rx up front 

## 2015-01-17 ENCOUNTER — Other Ambulatory Visit: Payer: Self-pay | Admitting: Sports Medicine

## 2015-01-30 ENCOUNTER — Other Ambulatory Visit: Payer: Self-pay | Admitting: Sports Medicine

## 2015-02-13 ENCOUNTER — Other Ambulatory Visit: Payer: Self-pay | Admitting: Sports Medicine

## 2015-02-21 ENCOUNTER — Other Ambulatory Visit: Payer: Self-pay | Admitting: Sports Medicine

## 2015-03-06 ENCOUNTER — Other Ambulatory Visit: Payer: Self-pay | Admitting: Family Medicine

## 2015-03-07 ENCOUNTER — Ambulatory Visit (INDEPENDENT_AMBULATORY_CARE_PROVIDER_SITE_OTHER): Payer: BLUE CROSS/BLUE SHIELD | Admitting: Family Medicine

## 2015-03-07 ENCOUNTER — Encounter: Payer: Self-pay | Admitting: Family Medicine

## 2015-03-07 VITALS — BP 113/79 | HR 110 | Temp 98.2°F | Wt 184.0 lb

## 2015-03-07 DIAGNOSIS — A499 Bacterial infection, unspecified: Secondary | ICD-10-CM | POA: Diagnosis not present

## 2015-03-07 DIAGNOSIS — J329 Chronic sinusitis, unspecified: Secondary | ICD-10-CM | POA: Diagnosis not present

## 2015-03-07 DIAGNOSIS — B9689 Other specified bacterial agents as the cause of diseases classified elsewhere: Secondary | ICD-10-CM

## 2015-03-07 MED ORDER — AMOXICILLIN-POT CLAVULANATE 500-125 MG PO TABS: ORAL_TABLET | ORAL | Status: DC

## 2015-03-07 MED ORDER — HYDROCODONE-HOMATROPINE 5-1.5 MG/5ML PO SYRP: 5.0000 mL | ORAL_SOLUTION | Freq: Three times a day (TID) | ORAL | Status: DC | PRN

## 2015-03-07 NOTE — Progress Notes (Signed)
CC: Deanna Keller is a 34 y.o. female is here for URI   Subjective: HPI:  Nasal congestion pain in the cheeks, postnasal drip and sore throat that's been present for the last week. She began to feel feverish over the weekend. No response to over-the-counter cough syrups. The cough has also occurred since Monday that is keeping her awake at night. He is described as nonproductive. Symptoms are present all hours of the day and night. Symptoms are moderate in severity. Nothing seems to be making them better or worse. Denies shortness of breath wheezing or blood in sputum   Review Of Systems Outlined In HPI  Past Medical History  Diagnosis Date  . Anxiety   . Alcohol abuse     sober since 2011    Past Surgical History  Procedure Laterality Date  . Gastric bypass  2005  . Cervical cone biopsy     Family History  Problem Relation Age of Onset  . Colon cancer Mother   . Depression Mother   . Hypertension Father     Social History   Social History  . Marital Status: Married    Spouse Name: N/A  . Number of Children: N/A  . Years of Education: N/A   Occupational History  . Not on file.   Social History Main Topics  . Smoking status: Former Smoker -- 0.50 packs/day for 5 years    Types: Cigarettes    Start date: 07/21/2012  . Smokeless tobacco: Never Used  . Alcohol Use: No  . Drug Use: No  . Sexual Activity:    Partners: Male   Other Topics Concern  . Not on file   Social History Narrative     Objective: BP 113/79 mmHg  Pulse 110  Temp(Src) 98.2 F (36.8 C) (Oral)  Wt 184 lb (83.462 kg)  SpO2 99%  General: Alert and Oriented, No Acute Distress HEENT: Pupils equal, round, reactive to light. Conjunctivae clear.  External ears unremarkable, canals clear with intact TMs with appropriate landmarks.  Middle ear appears open without effusion. Pink inferior turbinates.  Moist mucous membranes, pharynx without inflammation nor lesions.  Neck supple without palpable  lymphadenopathy nor abnormal masses. Lungs: Clear to auscultation bilaterally, no wheezing/ronchi/rales.  Comfortable work of breathing. Good air movement. Extremities: No peripheral edema.  Strong peripheral pulses.  Mental Status: No depression, anxiety, nor agitation. Skin: Warm and dry.  Assessment & Plan: Deanna Keller was seen today for uri.  Diagnoses and all orders for this visit:  Bacterial sinusitis -     Discontinue: HYDROcodone-homatropine (HYCODAN) 5-1.5 MG/5ML syrup; Take 5 mLs by mouth every 8 (eight) hours as needed for cough. -     amoxicillin-clavulanate (AUGMENTIN) 500-125 MG tablet; Take one by mouth every 8 hours for ten total days. -     HYDROcodone-homatropine (HYCODAN) 5-1.5 MG/5ML syrup; Take 5 mLs by mouth every 8 (eight) hours as needed for cough.    Actual sinusitis: Start Augmentin consider nasal saline washes, Hycodan to help with sleep and cough.  Return if symptoms worsen or fail to improve.

## 2015-03-08 MED ORDER — LEVOFLOXACIN 500 MG PO TABS: 500.0000 mg | ORAL_TABLET | Freq: Every day | ORAL | Status: DC

## 2015-03-21 ENCOUNTER — Other Ambulatory Visit: Payer: Self-pay | Admitting: Sports Medicine

## 2015-03-25 ENCOUNTER — Other Ambulatory Visit: Payer: Self-pay | Admitting: Sports Medicine

## 2015-04-27 ENCOUNTER — Encounter: Payer: Self-pay | Admitting: Sports Medicine

## 2015-04-28 MED ORDER — CYCLOBENZAPRINE HCL 10 MG PO TABS: ORAL_TABLET | ORAL | Status: DC

## 2015-04-28 NOTE — Addendum Note (Signed)
Addended by: Monica BectonHEKKEKANDAM, Aviannah Castoro J on: 04/28/2015 10:31 AM   Modules accepted: Orders

## 2015-05-10 ENCOUNTER — Other Ambulatory Visit: Payer: Self-pay | Admitting: Sports Medicine

## 2015-05-22 ENCOUNTER — Other Ambulatory Visit: Payer: Self-pay | Admitting: Sports Medicine

## 2015-06-06 ENCOUNTER — Encounter: Payer: Self-pay | Admitting: Sports Medicine

## 2015-06-06 NOTE — Telephone Encounter (Signed)
Forwarding to Dr. Ivan Anchors.

## 2015-06-07 ENCOUNTER — Ambulatory Visit (INDEPENDENT_AMBULATORY_CARE_PROVIDER_SITE_OTHER): Payer: BLUE CROSS/BLUE SHIELD | Admitting: Family Medicine

## 2015-06-07 ENCOUNTER — Encounter: Payer: Self-pay | Admitting: Family Medicine

## 2015-06-07 VITALS — BP 123/84 | HR 88 | Wt 188.0 lb

## 2015-06-07 DIAGNOSIS — F418 Other specified anxiety disorders: Secondary | ICD-10-CM | POA: Diagnosis not present

## 2015-06-07 DIAGNOSIS — F419 Anxiety disorder, unspecified: Principal | ICD-10-CM

## 2015-06-07 DIAGNOSIS — F329 Major depressive disorder, single episode, unspecified: Secondary | ICD-10-CM

## 2015-06-07 DIAGNOSIS — F32A Depression, unspecified: Secondary | ICD-10-CM

## 2015-06-07 MED ORDER — CYANOCOBALAMIN 1000 MCG/ML IJ KIT: PACK | INTRAMUSCULAR | Status: DC

## 2015-06-07 MED ORDER — CLONAZEPAM 0.5 MG PO TABS
0.2500 mg | ORAL_TABLET | Freq: Two times a day (BID) | ORAL | Status: DC | PRN
Start: 2015-06-07 — End: 2015-06-12

## 2015-06-07 NOTE — Progress Notes (Addendum)
CC: Deanna Keller is a 35 y.o. female is here for Depression and Anxiety   Subjective: HPI:  Follow-up depression and anxiety: She's noticed over the past 3-4 weeks all she wants to do is stay in bed and lashes taking care of her child. She does not believe that this is interfering with the care of her child. Her husband noticed that she's been acting more depressed and has lost interest in pleasurable hobbies. She reports that she feels anxious about nothing and that this is interfering with her quality of life. She denies thoughts or to harm self or others. She has a history of B12 deficiency and she is worried that some missed doses recently could be contributing to this as well. She denies any other mental disturbance or paranoia.   Review Of Systems Outlined In HPI  Past Medical History  Diagnosis Date  . Anxiety   . Alcohol abuse     sober since 2011    Past Surgical History  Procedure Laterality Date  . Gastric bypass  2005  . Cervical cone biopsy     Family History  Problem Relation Age of Onset  . Colon cancer Mother   . Depression Mother   . Hypertension Father     Social History   Social History  . Marital Status: Married    Spouse Name: N/A  . Number of Children: N/A  . Years of Education: N/A   Occupational History  . Not on file.   Social History Main Topics  . Smoking status: Former Smoker -- 0.50 packs/day for 5 years    Types: Cigarettes    Start date: 07/21/2012  . Smokeless tobacco: Never Used  . Alcohol Use: No  . Drug Use: No  . Sexual Activity:    Partners: Male   Other Topics Concern  . Not on file   Social History Narrative     Objective: BP 123/84 mmHg  Pulse 88  Wt 188 lb (85.276 kg)  SpO2 98%  Vital signs reviewed. General: Alert and Oriented, No Acute Distress HEENT: Pupils equal, round, reactive to light. Conjunctivae clear.  External ears unremarkable.  Moist mucous membranes. Lungs: Clear and comfortable work of breathing,  speaking in full sentences without accessory muscle use. Cardiac: Regular rate and rhythm.  Neuro: CN II-XII grossly intact, gait normal. Extremities: No peripheral edema.  Strong peripheral pulses.  Mental Status: Mildly depressed. No anxiety, nor agitation. Logical though process. Skin: Warm and dry. Assessment & Plan: [name] was seen today for depression and anxiety.  Diagnoses and all orders for this visit:  Anxiety and depression -     clonazePAM (KLONOPIN) 0.5 MG tablet; Take 0.5 tablets (0.25 mg total) by mouth 2 (two) times daily as needed for anxiety.  Other orders -     Cyanocobalamin 1000 MCG/ML KIT; Inject 1 mL every two weeks.   Anxiety depression: Uncontrolled chronic condition. Urged to increase Cymbalta and if no better in 1 week Will switch to Viibryd. She's been experiencing some problems with libido on Cymbalta and this may influence her decision to switch. For now start a low-dose of clonazepam on an as-needed basis for anxiety symptoms as long as it does not interfere with childcare.  25 minutes spent face-to-face during visit today of which at least 50% was counseling or coordinating care regarding: 1. Anxiety and depression      Return in about 4 weeks (around 07/05/2015).

## 2015-06-12 ENCOUNTER — Encounter: Payer: Self-pay | Admitting: Family Medicine

## 2015-06-12 DIAGNOSIS — F32A Depression, unspecified: Secondary | ICD-10-CM

## 2015-06-12 DIAGNOSIS — F329 Major depressive disorder, single episode, unspecified: Secondary | ICD-10-CM

## 2015-06-12 DIAGNOSIS — F419 Anxiety disorder, unspecified: Principal | ICD-10-CM

## 2015-06-12 MED ORDER — DULOXETINE HCL 60 MG PO CPEP: ORAL_CAPSULE | ORAL | Status: DC

## 2015-06-15 ENCOUNTER — Ambulatory Visit (INDEPENDENT_AMBULATORY_CARE_PROVIDER_SITE_OTHER): Payer: BLUE CROSS/BLUE SHIELD | Admitting: Family Medicine

## 2015-06-15 ENCOUNTER — Encounter: Payer: Self-pay | Admitting: Family Medicine

## 2015-06-15 VITALS — BP 129/84 | HR 88 | Wt 184.0 lb

## 2015-06-15 DIAGNOSIS — F32A Depression, unspecified: Secondary | ICD-10-CM

## 2015-06-15 DIAGNOSIS — Z30431 Encounter for routine checking of intrauterine contraceptive device: Secondary | ICD-10-CM | POA: Diagnosis not present

## 2015-06-15 DIAGNOSIS — F418 Other specified anxiety disorders: Secondary | ICD-10-CM | POA: Diagnosis not present

## 2015-06-15 DIAGNOSIS — F329 Major depressive disorder, single episode, unspecified: Secondary | ICD-10-CM

## 2015-06-15 DIAGNOSIS — F419 Anxiety disorder, unspecified: Secondary | ICD-10-CM

## 2015-06-15 MED ORDER — BUSPIRONE HCL 15 MG PO TABS: 15.0000 mg | ORAL_TABLET | Freq: Three times a day (TID) | ORAL | Status: DC

## 2015-06-15 MED ORDER — CLONAZEPAM 0.5 MG PO TABS: 0.5000 mg | ORAL_TABLET | Freq: Two times a day (BID) | ORAL | Status: DC | PRN

## 2015-06-15 NOTE — Progress Notes (Signed)
CC: Deanna Keller is a 35 y.o. female is here for Anxiety; Depression; and Contraception   Subjective: HPI:  Follow-up anxiety depression: Depression has been slightly getting better however she believes that anxiety is bring her down the most right now. She is having episodes of tachycardia and rapid heartbeat with tremor. Symptoms seem to be worse when her husband doesn't seem to acknowledge that symptoms are interfering with her quality of life. Nothing else seems to make symptoms better or worse. She is taking up to 0.5 mg of clonazepam twice a day does not feel like she's been taking anything, she gets no benefit or side effects from this medication. She wants to that she can take in conjunction with cymbalta to help better control her anxiety. Symptoms are present on a daily basis can occur at night or in the mornings.  No thoughts wanting to harm self or others. Denies fevers, chills, unintentional weight loss or gastrointestinal complaints   Review Of Systems Outlined In HPI  Past Medical History  Diagnosis Date  . Anxiety   . Alcohol abuse     sober since 2011    Past Surgical History  Procedure Laterality Date  . Gastric bypass  2005  . Cervical cone biopsy     Family History  Problem Relation Age of Onset  . Colon cancer Mother   . Depression Mother   . Hypertension Father     Social History   Social History  . Marital Status: Married    Spouse Name: N/A  . Number of Children: N/A  . Years of Education: N/A   Occupational History  . Not on file.   Social History Main Topics  . Smoking status: Former Smoker -- 0.50 packs/day for 5 years    Types: Cigarettes    Start date: 07/21/2012  . Smokeless tobacco: Never Used  . Alcohol Use: No  . Drug Use: No  . Sexual Activity:    Partners: Male   Other Topics Concern  . Not on file   Social History Narrative     Objective: BP 129/84 mmHg  Pulse 88  Wt 184 lb (83.462 kg)  Vital signs reviewed. General:  Alert and Oriented, No Acute Distress HEENT: Pupils equal, round, reactive to light. Conjunctivae clear.  External ears unremarkable.  Moist mucous membranes. Lungs: Clear and comfortable work of breathing, speaking in full sentences without accessory muscle use. Cardiac: Regular rate and rhythm.  Neuro: CN II-XII grossly intact, gait normal. Extremities: No peripheral edema.  Strong peripheral pulses.  Mental Status: no depression. Mild anxiety. no agitation. Logical though process. Skin: Warm and dry.  Assessment & Plan: Saran was seen today for anxiety, depression and contraception.  Diagnoses and all orders for this visit:  IUD check up  Depression -     Ambulatory referral to Psychiatry  Anxiety and depression -     busPIRone (BUSPAR) 15 MG tablet; Take 1 tablet (15 mg total) by mouth 3 (three) times daily. To Prevent Anxiety -     clonazePAM (KLONOPIN) 0.5 MG tablet; Take 1 tablet (0.5 mg total) by mouth 2 (two) times daily as needed for anxiety. -     Ambulatory referral to Psychiatry   Anxiety and depression: Uncontrolled anxiety component. She speculates that her IUD has been causing some of his symptoms and would like to have her removed. I've offered to refer her to our women's health clinic which she would like to go back to [facility] with a self-referral.  Continue Cymbalta, starting BuSpar 3 times a day and continue as needed clonazepam. Joint decision  For psychiatry referral for her hard to treat anxiety and depression.   25 minutes spent face-to-face during visit today of which at least 50% was counseling or coordinating care regarding: 1. IUD check up   2. Depression   3. Anxiety and depression      Return in about 4 weeks (around 07/13/2015) for Mood.

## 2015-06-18 ENCOUNTER — Other Ambulatory Visit: Payer: Self-pay | Admitting: Sports Medicine

## 2015-07-04 ENCOUNTER — Ambulatory Visit (INDEPENDENT_AMBULATORY_CARE_PROVIDER_SITE_OTHER): Payer: BLUE CROSS/BLUE SHIELD | Admitting: Family Medicine

## 2015-07-04 ENCOUNTER — Encounter: Payer: Self-pay | Admitting: Family Medicine

## 2015-07-04 ENCOUNTER — Ambulatory Visit: Payer: BLUE CROSS/BLUE SHIELD | Admitting: Family Medicine

## 2015-07-04 VITALS — BP 125/82 | HR 106 | Temp 98.6°F | Wt 179.0 lb

## 2015-07-04 DIAGNOSIS — R509 Fever, unspecified: Secondary | ICD-10-CM | POA: Diagnosis not present

## 2015-07-04 MED ORDER — OSELTAMIVIR PHOSPHATE 75 MG PO CAPS: 75.0000 mg | ORAL_CAPSULE | Freq: Two times a day (BID) | ORAL | Status: DC

## 2015-07-04 NOTE — Progress Notes (Signed)
CC: Deanna Keller is a 35 y.o. female is here for Flu Sx   Subjective: HPI:  Sudden onset body aches, fatigue, fever of 101.3, chills, sweats and diarrhea that has been present for the past 12-20 hours. Symptoms is persistent and slowly worsening Tylenol has been the only intervention and was only mildly helpful. She is thirsty but has no desire to eat. She denies any abdominal pain.[name]. She was exposed to someone with the flu on Sunday. Denies confusion, sore throat, neck pain or photophobia.   Review Of Systems Outlined In HPI  Past Medical History  Diagnosis Date  . Anxiety   . Alcohol abuse     sober since 2011    Past Surgical History  Procedure Laterality Date  . Gastric bypass  2005  . Cervical cone biopsy     Family History  Problem Relation Age of Onset  . Colon cancer Mother   . Depression Mother   . Hypertension Father     Social History   Social History  . Marital Status: Married    Spouse Name: N/A  . Number of Children: N/A  . Years of Education: N/A   Occupational History  . Not on file.   Social History Main Topics  . Smoking status: Former Smoker -- 0.50 packs/day for 5 years    Types: Cigarettes    Start date: 07/21/2012  . Smokeless tobacco: Never Used  . Alcohol Use: No  . Drug Use: No  . Sexual Activity:    Partners: Male   Other Topics Concern  . Not on file   Social History Narrative     Objective: BP 125/82 mmHg  Pulse 106  Temp(Src) 98.6 F (37 C) (Oral)  Wt 179 lb (81.194 kg)  General: Alert and Oriented, No Acute Distress HEENT: Pupils equal, round, reactive to light. Conjunctivae clear.  External ears unremarkable, canals clear with intact TMs with appropriate landmarks.  Middle ear appears open without effusion. Pink inferior turbinates.  Moist mucous membranes, pharynx without inflammation nor lesions.  Neck supple without palpable lymphadenopathy nor abnormal masses. Lungs: Clear to auscultation bilaterally, no  wheezing/ronchi/rales.  Comfortable work of breathing. Good air movement. Cardiac: Regular rate and rhythm. Normal S1/S2.  No murmurs, rubs, nor gallops.   Extremities: No peripheral edema.  Strong peripheral pulses.  Mental Status: No depression, anxiety, nor agitation. Skin: Warm and dry.  Assessment & Plan: Deanna Keller was seen today for flu sx.  Diagnoses and all orders for this visit:  Fever, unspecified fever cause  Other orders -     oseltamivir (TAMIFLU) 75 MG capsule; Take 1 capsule (75 mg total) by mouth 2 (two) times daily.   High suspicion for flu therefore start tamiflu, ibuprofen 800mg  tid and stick to a liquids only diet to stay well hydrated for the next 24 hours.   Return if symptoms worsen or fail to improve.

## 2015-07-06 ENCOUNTER — Encounter: Payer: Self-pay | Admitting: Family Medicine

## 2015-07-11 ENCOUNTER — Ambulatory Visit (INDEPENDENT_AMBULATORY_CARE_PROVIDER_SITE_OTHER): Payer: BLUE CROSS/BLUE SHIELD | Admitting: Family Medicine

## 2015-07-11 ENCOUNTER — Encounter: Payer: Self-pay | Admitting: Family Medicine

## 2015-07-11 VITALS — BP 111/75 | HR 101 | Wt 180.0 lb

## 2015-07-11 DIAGNOSIS — F329 Major depressive disorder, single episode, unspecified: Secondary | ICD-10-CM | POA: Diagnosis not present

## 2015-07-11 DIAGNOSIS — F419 Anxiety disorder, unspecified: Principal | ICD-10-CM

## 2015-07-11 DIAGNOSIS — F418 Other specified anxiety disorders: Secondary | ICD-10-CM

## 2015-07-11 DIAGNOSIS — F32A Depression, unspecified: Secondary | ICD-10-CM

## 2015-07-11 MED ORDER — ALPRAZOLAM 0.5 MG PO TABS: 0.5000 mg | ORAL_TABLET | Freq: Two times a day (BID) | ORAL | Status: DC | PRN

## 2015-07-11 MED ORDER — FUSION 65-65-25-30 MG PO CAPS: 1.0000 | ORAL_CAPSULE | Freq: Every day | ORAL | Status: DC

## 2015-07-11 MED ORDER — ARIPIPRAZOLE 5 MG PO TABS: 5.0000 mg | ORAL_TABLET | Freq: Every day | ORAL | Status: DC

## 2015-07-11 NOTE — Progress Notes (Signed)
CC: Deanna Keller is a 34 y.o. female is here for Anxiety   Subjective: HPI:  Follow-up anxiety: Does overlie she's continuing to get panic attacks that can occur in any environment. She gives examples of once when she was driving she started to get a tremor and felt her heart racing and felt like oral was closing in and had to immediately drive home. She can also get this if she is just sitting and relaxing minding her own business. Symptoms are no longer responding to clonazepam. Symptoms are occurring most days out of the week. She also feels like she is at least mildly nervous. She is not sure what she is exactly nervous about. She still feels that her depression is moderately well controlled. Symptoms are not interfering with her ability to care for her child. She denies any other mental disturbance.  She's tried a variety of different SSRIs and SNRIs none of which have ever really provided satisfactory relief of her anxiety and depression.   Review Of Systems Outlined In HPI  Past Medical History  Diagnosis Date  . Anxiety   . Alcohol abuse     sober since 2011    Past Surgical History  Procedure Laterality Date  . Gastric bypass  2005  . Cervical cone biopsy     Family History  Problem Relation Age of Onset  . Colon cancer Mother   . Depression Mother   . Hypertension Father     Social History   Social History  . Marital Status: Married    Spouse Name: N/A  . Number of Children: N/A  . Years of Education: N/A   Occupational History  . Not on file.   Social History Main Topics  . Smoking status: Former Smoker -- 0.50 packs/day for 5 years    Types: Cigarettes    Start date: 07/21/2012  . Smokeless tobacco: Never Used  . Alcohol Use: No  . Drug Use: No  . Sexual Activity:    Partners: Male   Other Topics Concern  . Not on file   Social History Narrative     Objective: BP 111/75 mmHg  Pulse 101  Wt 180 lb (81.647 kg)  Vital signs reviewed. General:  Alert and Oriented, No Acute Distress HEENT: Pupils equal, round, reactive to light. Conjunctivae clear.  External ears unremarkable.  Moist mucous membranes. Lungs: Clear and comfortable work of breathing, speaking in full sentences without accessory muscle use. Cardiac: Regular rate and rhythm.  Neuro: CN II-XII grossly intact, gait normal. Extremities: No peripheral edema.  Strong peripheral pulses.  Mental Status: No depression, anxiety, nor agitation. Logical though process. Skin: Warm and dry. Assessment & Plan: Deanna Keller was seen today for anxiety.  Diagnoses and all orders for this visit:  Anxiety and depression -     ARIPiprazole (ABILIFY) 5 MG tablet; Take 1 tablet (5 mg total) by mouth daily. -     ALPRAZolam (XANAX) 0.5 MG tablet; Take 1 tablet (0.5 mg total) by mouth 2 (two) times daily as needed for anxiety.  Depression  Other orders -     Cancel: clonazePAM (KLONOPIN) 0.5 MG tablet; Take 1 tablet (0.5 mg total) by mouth 2 (two) times daily as needed for anxiety. -     Fe Fum-Fe Poly-Vit C-Lactobac (FUSION) 65-65-25-30 MG CAPS; Take 1 capsule by mouth daily.   Anxiety and depression: Controlled chronic conditions therefore stopping Cymbalta by cutting back to only 1 tablet a day for week and starting Abilify as soon  as possible. Stop BuSpar immediately. Stop clonazepam immediately and switching to Xanax. She was again reminded to only take medications as prescribed   25 minutes spent face-to-face during visit today of which at least 50% was counseling or coordinating care regarding: 1. Anxiety and depression   2. Depression      Return in about 4 weeks (around 08/08/2015).

## 2015-07-12 ENCOUNTER — Encounter: Payer: Self-pay | Admitting: Family Medicine

## 2015-07-13 ENCOUNTER — Ambulatory Visit: Payer: BLUE CROSS/BLUE SHIELD | Admitting: Family Medicine

## 2015-07-14 ENCOUNTER — Other Ambulatory Visit: Payer: Self-pay | Admitting: Sports Medicine

## 2015-07-17 ENCOUNTER — Encounter: Payer: Self-pay | Admitting: Family Medicine

## 2015-07-17 MED ORDER — ARIPIPRAZOLE 15 MG PO TABS: 15.0000 mg | ORAL_TABLET | Freq: Every day | ORAL | Status: DC

## 2015-07-24 ENCOUNTER — Other Ambulatory Visit: Payer: Self-pay | Admitting: Sports Medicine

## 2015-07-26 ENCOUNTER — Encounter: Payer: Self-pay | Admitting: Family Medicine

## 2015-07-26 ENCOUNTER — Other Ambulatory Visit: Payer: Self-pay | Admitting: Sports Medicine

## 2015-07-26 DIAGNOSIS — F329 Major depressive disorder, single episode, unspecified: Secondary | ICD-10-CM

## 2015-07-26 DIAGNOSIS — F419 Anxiety disorder, unspecified: Principal | ICD-10-CM

## 2015-07-26 DIAGNOSIS — F32A Depression, unspecified: Secondary | ICD-10-CM

## 2015-07-26 MED ORDER — ALPRAZOLAM 1 MG PO TABS: 1.0000 mg | ORAL_TABLET | Freq: Two times a day (BID) | ORAL | Status: DC | PRN

## 2015-07-26 NOTE — Telephone Encounter (Signed)
Pleas advise.

## 2015-07-26 NOTE — Telephone Encounter (Signed)
Rx sent to [facility]

## 2015-08-01 MED ORDER — ALPRAZOLAM 1 MG PO TABS: 1.0000 mg | ORAL_TABLET | Freq: Two times a day (BID) | ORAL | Status: DC | PRN

## 2015-08-01 NOTE — Telephone Encounter (Signed)
Deanna Keller, Rx placed in in-box ready for pickup/faxing.  

## 2015-08-01 NOTE — Addendum Note (Signed)
Addended by: Laren BoomHOMMEL, Alexine Pilant on: 08/01/2015 08:19 AM   Modules accepted: Orders

## 2015-08-02 ENCOUNTER — Encounter: Payer: Self-pay | Admitting: Family Medicine

## 2015-08-02 NOTE — Telephone Encounter (Signed)
Sorry Rx faxed

## 2015-08-02 NOTE — Telephone Encounter (Signed)
Please advise 

## 2015-08-14 ENCOUNTER — Other Ambulatory Visit: Payer: Self-pay | Admitting: Family Medicine

## 2015-08-14 DIAGNOSIS — F32A Depression, unspecified: Secondary | ICD-10-CM

## 2015-08-14 DIAGNOSIS — F419 Anxiety disorder, unspecified: Principal | ICD-10-CM

## 2015-08-14 DIAGNOSIS — F329 Major depressive disorder, single episode, unspecified: Secondary | ICD-10-CM

## 2015-08-15 ENCOUNTER — Other Ambulatory Visit: Payer: Self-pay | Admitting: Family Medicine

## 2015-08-15 MED ORDER — ALPRAZOLAM 1 MG PO TABS: 1.0000 mg | ORAL_TABLET | Freq: Two times a day (BID) | ORAL | Status: DC | PRN

## 2015-08-15 NOTE — Telephone Encounter (Signed)
Rx faxed

## 2015-08-15 NOTE — Telephone Encounter (Signed)
[name], Rx placed in in-box ready for pickup/faxing.  

## 2015-08-31 ENCOUNTER — Telehealth: Payer: Self-pay | Admitting: Family Medicine

## 2015-08-31 ENCOUNTER — Other Ambulatory Visit: Payer: Self-pay | Admitting: Family Medicine

## 2015-08-31 DIAGNOSIS — F32A Depression, unspecified: Secondary | ICD-10-CM

## 2015-08-31 DIAGNOSIS — F329 Major depressive disorder, single episode, unspecified: Secondary | ICD-10-CM

## 2015-08-31 DIAGNOSIS — F419 Anxiety disorder, unspecified: Principal | ICD-10-CM

## 2015-09-04 MED ORDER — ALPRAZOLAM 1 MG PO TABS: 1.0000 mg | ORAL_TABLET | Freq: Two times a day (BID) | ORAL | Status: DC | PRN

## 2015-09-04 NOTE — Telephone Encounter (Signed)
Routed to PCP for review on early refill.

## 2015-09-04 NOTE — Telephone Encounter (Signed)
Riley ChurchesKelsi, Approved with fill date on/after May 31st 2017

## 2015-09-06 ENCOUNTER — Ambulatory Visit (INDEPENDENT_AMBULATORY_CARE_PROVIDER_SITE_OTHER): Payer: BLUE CROSS/BLUE SHIELD | Admitting: Family Medicine

## 2015-09-06 ENCOUNTER — Encounter: Payer: Self-pay | Admitting: Family Medicine

## 2015-09-06 VITALS — Wt 184.0 lb

## 2015-09-06 DIAGNOSIS — F411 Generalized anxiety disorder: Secondary | ICD-10-CM | POA: Diagnosis not present

## 2015-09-06 DIAGNOSIS — F32A Depression, unspecified: Secondary | ICD-10-CM

## 2015-09-06 DIAGNOSIS — F1390 Sedative, hypnotic, or anxiolytic use, unspecified, uncomplicated: Secondary | ICD-10-CM

## 2015-09-06 DIAGNOSIS — F1310 Sedative, hypnotic or anxiolytic abuse, uncomplicated: Secondary | ICD-10-CM

## 2015-09-06 DIAGNOSIS — F329 Major depressive disorder, single episode, unspecified: Secondary | ICD-10-CM | POA: Diagnosis not present

## 2015-09-06 MED ORDER — HYDROXYZINE HCL 25 MG PO TABS
25.0000 mg | ORAL_TABLET | Freq: Three times a day (TID) | ORAL | Status: DC | PRN
Start: 2015-09-06 — End: 2016-04-16

## 2015-09-06 NOTE — Progress Notes (Signed)
CC: Deanna Keller is a 35 y.o. female is here for Anxiety   Subjective: HPI:  Follow-up depression: She tells me that her biggest problem right now is dealing with anxiety. She doesn't believe that depression is severely influencing her quality of life but she will be going to see a psychiatrist in the next month.  Follow-up anxiety: She is recently got a new job and has not been able to deal with the stress. She self titrated up her Xanax taking a full dose in the morning, half a dose while at work and then a full dose in the evening. This is now the second time that she has self titrated a controlled substance even after being told repeatedly that this type of behavior is not safe and I advised against it.   Review Of Systems Outlined In HPI  Past Medical History  Diagnosis Date  . Anxiety   . Alcohol abuse     sober since 2011    Past Surgical History  Procedure Laterality Date  . Gastric bypass  2005  . Cervical cone biopsy     Family History  Problem Relation Age of Onset  . Colon cancer Mother   . Depression Mother   . Hypertension Father     Social History   Social History  . Marital Status: Married    Spouse Name: N/A  . Number of Children: N/A  . Years of Education: N/A   Occupational History  . Not on file.   Social History Main Topics  . Smoking status: Former Smoker -- 0.50 packs/day for 5 years    Types: Cigarettes    Start date: 07/21/2012  . Smokeless tobacco: Never Used  . Alcohol Use: No  . Drug Use: No  . Sexual Activity:    Partners: Male   Other Topics Concern  . Not on file   Social History Narrative     Objective: Wt 184 lb (83.462 kg)  Vital signs reviewed. General: Alert and Oriented, No Acute Distress HEENT: Pupils equal, round, reactive to light. Conjunctivae clear.  External ears unremarkable.  Moist mucous membranes. Lungs: Clear and comfortable work of breathing, speaking in full sentences without accessory muscle  use. Cardiac: Regular rate and rhythm.  Neuro: CN II-XII grossly intact, gait normal. Extremities: No peripheral edema.  Strong peripheral pulses.  Mental Status: No depression, anxiety, nor agitation. Logical though process. Skin: Warm and dry. Assessment & Plan: Deanna Keller was seen today for anxiety.  Diagnoses and all orders for this visit:  Depression  Generalized anxiety disorder -     hydrOXYzine (ATARAX/VISTARIL) 25 MG tablet; Take 1 tablet (25 mg total) by mouth 3 (three) times daily as needed.  Benzodiazepine misuse   Generalized anxiety disorder: Uncontrolled, I've let her know that she is putting my license in her health in jeopardy by self titrating controlled substances and I'm no longer willing to prescribe any controlled substance to her. Switch to hydroxyzine pending establishing with psychiatry. Depression: Controlled with Abilify  Return if symptoms worsen or fail to improve.

## 2015-09-16 ENCOUNTER — Other Ambulatory Visit: Payer: Self-pay | Admitting: Sports Medicine

## 2015-09-18 ENCOUNTER — Other Ambulatory Visit: Payer: Self-pay | Admitting: Family Medicine

## 2015-10-23 ENCOUNTER — Other Ambulatory Visit: Payer: Self-pay | Admitting: Family Medicine

## 2015-11-22 ENCOUNTER — Other Ambulatory Visit: Payer: Self-pay | Admitting: Family Medicine

## 2015-12-26 ENCOUNTER — Ambulatory Visit (INDEPENDENT_AMBULATORY_CARE_PROVIDER_SITE_OTHER): Payer: BLUE CROSS/BLUE SHIELD | Admitting: Sports Medicine

## 2015-12-26 ENCOUNTER — Encounter: Payer: Self-pay | Admitting: Sports Medicine

## 2015-12-26 DIAGNOSIS — J209 Acute bronchitis, unspecified: Secondary | ICD-10-CM

## 2015-12-26 MED ORDER — PREDNISONE 50 MG PO TABS: 50.0000 mg | ORAL_TABLET | Freq: Every day | ORAL | 0 refills | Status: DC

## 2015-12-26 MED ORDER — HYDROCODONE-HOMATROPINE 5-1.5 MG/5ML PO SYRP: 5.0000 mL | ORAL_SOLUTION | Freq: Three times a day (TID) | ORAL | 0 refills | Status: DC | PRN

## 2015-12-26 NOTE — Assessment & Plan Note (Signed)
Hycodan, over-the-counter cough and flu medications. Prednisone per patient request. Return to see me if no better in 1.5 weeks.

## 2015-12-26 NOTE — Progress Notes (Signed)
  Subjective:    CC: cough, congestion   HPI: 35 yo presenting for evaluation of cough. Patient has had a dry cough, runny nose, congestion, and sore throat for the past five days.  She says her son recently started preschool and has similar symptoms.  She says symptoms began last Thursday with sinus pressure and a sore throat.  Now her primary complaint is congestion, runny nose, and dry cough.  She has tried Nyquil and Tessalon drops without relief.  Denies SOB, wheezing, CP, fevers, chills, ear pain, sinus pressure, nausea, vomiting, abdominal pain.  She is hoping to get a prescription for Hycodan and steroids.     Past medical history, Surgical history, Family history not pertinant except as noted below, Social history, Allergies, and medications have been entered into the medical record, reviewed, and no changes needed.   Review of Systems: No fevers, chills, night sweats, weight loss, chest pain, or shortness of breath.   Objective:    General: Well Developed, well nourished, and in no acute distress.  Neuro: Alert and oriented x3, extra-ocular muscles intact, sensation grossly intact.  HEENT: Normocephalic, atraumatic, pupils equal round reactive to light, neck supple, no masses, no lymphadenopathy, thyroid nonpalpable.  TMs normal bilaterally.  Erythema in posterior pharynx.  No exudates.    Skin: Warm and dry, no rashes. Cardiac: Regular rate and rhythm, no murmurs rubs or gallops, no lower extremity edema.  Respiratory: Clear to auscultation bilaterally.  No wheezing. Not using accessory muscles, speaking in full sentences.   Impression and Recommendations:    1. Viral bronchitis -Hycodan prescription -5 day course of steroids.  Call after finishing steroid course to update on symptoms.  -Return in 2 weeks if symptoms have not resolved

## 2016-01-02 ENCOUNTER — Encounter: Payer: Self-pay | Admitting: Sports Medicine

## 2016-01-15 ENCOUNTER — Other Ambulatory Visit: Payer: Self-pay | Admitting: Family Medicine

## 2016-01-26 ENCOUNTER — Encounter: Payer: Self-pay | Admitting: Physician Assistant

## 2016-01-29 ENCOUNTER — Encounter: Payer: Self-pay | Admitting: Sports Medicine

## 2016-01-29 NOTE — Telephone Encounter (Signed)
Spoke with Pt, Rx sent to different pharmacy for her son Beryle Beams([facility]).

## 2016-01-30 ENCOUNTER — Encounter: Payer: Self-pay | Admitting: Sports Medicine

## 2016-01-30 NOTE — Telephone Encounter (Signed)
Called and went over directions in detail [facility]. Mother able to verbalized understanding of Rx directions and will administer properly. No further concerns.

## 2016-03-29 ENCOUNTER — Other Ambulatory Visit: Payer: Self-pay | Admitting: Physician Assistant

## 2016-04-16 ENCOUNTER — Ambulatory Visit (INDEPENDENT_AMBULATORY_CARE_PROVIDER_SITE_OTHER): Payer: BLUE CROSS/BLUE SHIELD | Admitting: Osteopathic Medicine

## 2016-04-16 ENCOUNTER — Encounter: Payer: Self-pay | Admitting: Osteopathic Medicine

## 2016-04-16 VITALS — BP 122/72 | HR 71 | Temp 97.2°F | Ht 65.0 in | Wt 199.0 lb

## 2016-04-16 DIAGNOSIS — B9789 Other viral agents as the cause of diseases classified elsewhere: Secondary | ICD-10-CM

## 2016-04-16 DIAGNOSIS — J069 Acute upper respiratory infection, unspecified: Secondary | ICD-10-CM

## 2016-04-16 MED ORDER — PREDNISONE 50 MG PO TABS: 50.0000 mg | ORAL_TABLET | Freq: Every day | ORAL | 0 refills | Status: DC

## 2016-04-16 MED ORDER — HYDROCODONE-HOMATROPINE 5-1.5 MG/5ML PO SYRP: 5.0000 mL | ORAL_SOLUTION | Freq: Three times a day (TID) | ORAL | 0 refills | Status: DC | PRN

## 2016-04-16 NOTE — Patient Instructions (Signed)
Note: the following list assumes no pregnancy, normal liver & kidney function and no other drug interactions. Dr. Jaikob Borgwardt has highlighted medications which are safe for you to use, but these may not be appropriate for everyone. Always ask a pharmacist or qualified medical provider if there are any questions!    Aches/Pains, Fever Acetaminophen (Tylenol) 500 mg tablets - take max 2 tablets (1000 mg) every 6 hours (4 times per day)  Ibuprofen (Motrin) 200 mg tablets - take max 4 tablets (800 mg) every 6 hours  Sinus Congestion Prescription Atrovent Cromolyn Nasal Spray (NasalCrom) 1 spray each nostril 3-4 times per day, max 6 imes per day Nasal Saline if desired Oxymetolazone (Afrin, others) sparing use due to rebound congestion Phenylephrine (Sudafed) 10 mg tablets every 4 hours (or the 12-hour formulation) Diphenhydramine (Benadryl) 25 mg tablets - take max 2 tablets every 4 hours  Cough & Sore Throat Prescription cough pills or syrups Dextromethorphan (Robitussin, others) - cough suppressant Guaifenesin (Robitussin, Mucinex, others) - expectorant (helps cough up mucus) (Dextromethorphan and Guaifenesin also come in a combination tablet) Lozenges w/ Benzocaine + Menthol (Cepacol) Honey - as much as you want! Teas which "coat the throat" - look for ingredients Elm Bark, Licorice Root, Marshmallow Root  Other Zinc Lozenges within 24 hours of symptoms onset - mixed evidence this shortens the duration of the common cold Don't waste your money on Vitamin C or Echinacea    

## 2016-04-16 NOTE — Progress Notes (Signed)
HPI: Deanna Keller is a 36 y.o. female who [facility] [place] [facility] for chief complaint of:  Chief Complaint  Patient presents with  . Cough    Acute Illness: . Quality: cough, sore throat, headache, back pain,  . Assoc signs/symptoms: see ROS . Duration: 1.5 days . Modifying factors: has tried the following OTC/Rx medications: NyQuli not helpful, didn't make her sleepy, Advil cold & sinus  "Need to feel better by Saturday, I have a funeral to go to."   Past medical, social and family history reviewed. Current medications and allergies reviewed.     Review of Systems:  Constitutional: No  fever/chills  HEENT: Yes  headache, Yes  sore throat, No  swollen glands  Cardiovascular: No chest pain  Respiratory:Yes  cough, No  shortness of breath  Gastrointestinal: No  nausea, No  vomiting,  No  diarrhea  Musculoskeletal:   Yes  myalgia/arthralgia  Skin/Integument:  No  rash   Exam:  BP 122/72   Pulse 71   Temp 97.2 F (36.2 C) (Oral)   Ht 5\' 5"  (1.651 m)   Wt 199 lb (90.3 kg)   BMI 33.12 kg/m   Constitutional: VSS, see above. General Appearance: alert, well-developed, well-nourished, NAD  Eyes: Normal lids and conjunctive, non-icteric sclera, PERRLA  Ears, Nose, Mouth, Throat: Normal external inspection ears/nares/mouth/lips/gums, clear fluid hebing b/l TM, MMM; posterior pharynx with erythema, without exudate, nasal mucosa normal  Neck: No masses, trachea midline. abnormal - mild enlarged LN R post cervical lymph nodes  Respiratory: Normal respiratory effort. No  wheeze/rhonchi/rales  Cardiovascular: S1/S2 normal, no murmur/rub/gallop auscultated. RRR.    ASSESSMENT/PLAN: Conservative management symptoms as below. Advised on natural history of viral URI, hopefully she will feel better in time to go to funeral as planned but contagion/contact precautions were reviewed.   Viral URI with cough - Plan:  HYDROcodone-homatropine (HYCODAN) 5-1.5 MG/5ML syrup, predniSONE (DELTASONE) 50 MG tablet     Patient Instructions  Note: the following list assumes no pregnancy, normal liver & kidney function and no other drug interactions. Dr. Lyn HollingsheadAlexander has highlighted medications which are safe for you to use, but these may not be appropriate for everyone. Always ask a pharmacist or qualified medical provider if there are any questions!    Aches/Pains, Fever Acetaminophen (Tylenol) 500 mg tablets - take max 2 tablets (1000 mg) every 6 hours (4 times per day)  Ibuprofen (Motrin) 200 mg tablets - take max 4 tablets (800 mg) every 6 hours  Sinus Congestion Prescription Atrovent Cromolyn Nasal Spray (NasalCrom) 1 spray each nostril 3-4 times per day, max 6 imes per day Nasal Saline if desired Oxymetolazone (Afrin, others) sparing use due to rebound congestion Phenylephrine (Sudafed) 10 mg tablets every 4 hours (or the 12-hour formulation) Diphenhydramine (Benadryl) 25 mg tablets - take max 2 tablets every 4 hours  Cough & Sore Throat Prescription cough pills or syrups Dextromethorphan (Robitussin, others) - cough suppressant Guaifenesin (Robitussin, Mucinex, others) - expectorant (helps cough up mucus) (Dextromethorphan and Guaifenesin also come in a combination tablet) Lozenges w/ Benzocaine + Menthol (Cepacol) Honey - as much as you want! Teas which "coat the throat" - look for ingredients Elm Bark, Licorice Root, Marshmallow Root  Other Zinc Lozenges within 24 hours of symptoms onset - mixed evidence this shortens the duration of the common cold Don't waste your money on Vitamin C or Echinacea       Visit summary was printed for the patient with medications and  pertinent instructions for patient to review. ER/RTC precautions reviewed. All questions answered. Return if symptoms worsen or fail to improve 7-10 days after starting, call us or schedule a visit.

## 2016-04-23 ENCOUNTER — Other Ambulatory Visit: Payer: Self-pay | Admitting: Physician Assistant

## 2016-04-24 ENCOUNTER — Telehealth: Payer: Self-pay

## 2016-04-24 DIAGNOSIS — J069 Acute upper respiratory infection, unspecified: Secondary | ICD-10-CM

## 2016-04-24 DIAGNOSIS — B9789 Other viral agents as the cause of diseases classified elsewhere: Principal | ICD-10-CM

## 2016-04-24 MED ORDER — HYDROCODONE-HOMATROPINE 5-1.5 MG/5ML PO SYRP: 5.0000 mL | ORAL_SOLUTION | Freq: Four times a day (QID) | ORAL | 0 refills | Status: DC | PRN

## 2016-04-24 NOTE — Telephone Encounter (Signed)
Refill given, placed in fax box. If cough is still persisting after this course of treatment, will need to come to the office for further evaluation.

## 2016-04-24 NOTE — Telephone Encounter (Signed)
Patient has been informed. [facility] [employer],[name]  

## 2016-04-24 NOTE — Telephone Encounter (Signed)
Patient was seen 04/16/16 for cough and was given Hycodan. She is requesting a refill of this medication because she is still coughing. Please advise. Rhonda Cunningham,[name]

## 2016-05-07 ENCOUNTER — Other Ambulatory Visit: Payer: Self-pay | Admitting: Family Medicine

## 2016-05-14 DIAGNOSIS — F3181 Bipolar II disorder: Secondary | ICD-10-CM | POA: Diagnosis not present

## 2016-05-14 DIAGNOSIS — F340 Cyclothymic disorder: Secondary | ICD-10-CM | POA: Diagnosis not present

## 2016-05-15 IMAGING — CR DG CHEST 2V
2 series · 2 of 2 positions shown · non-contrast
Comparison: None.

CLINICAL DATA: One week of cough, former smoker.

EXAM:
CHEST  2 VIEW

[chest pa]
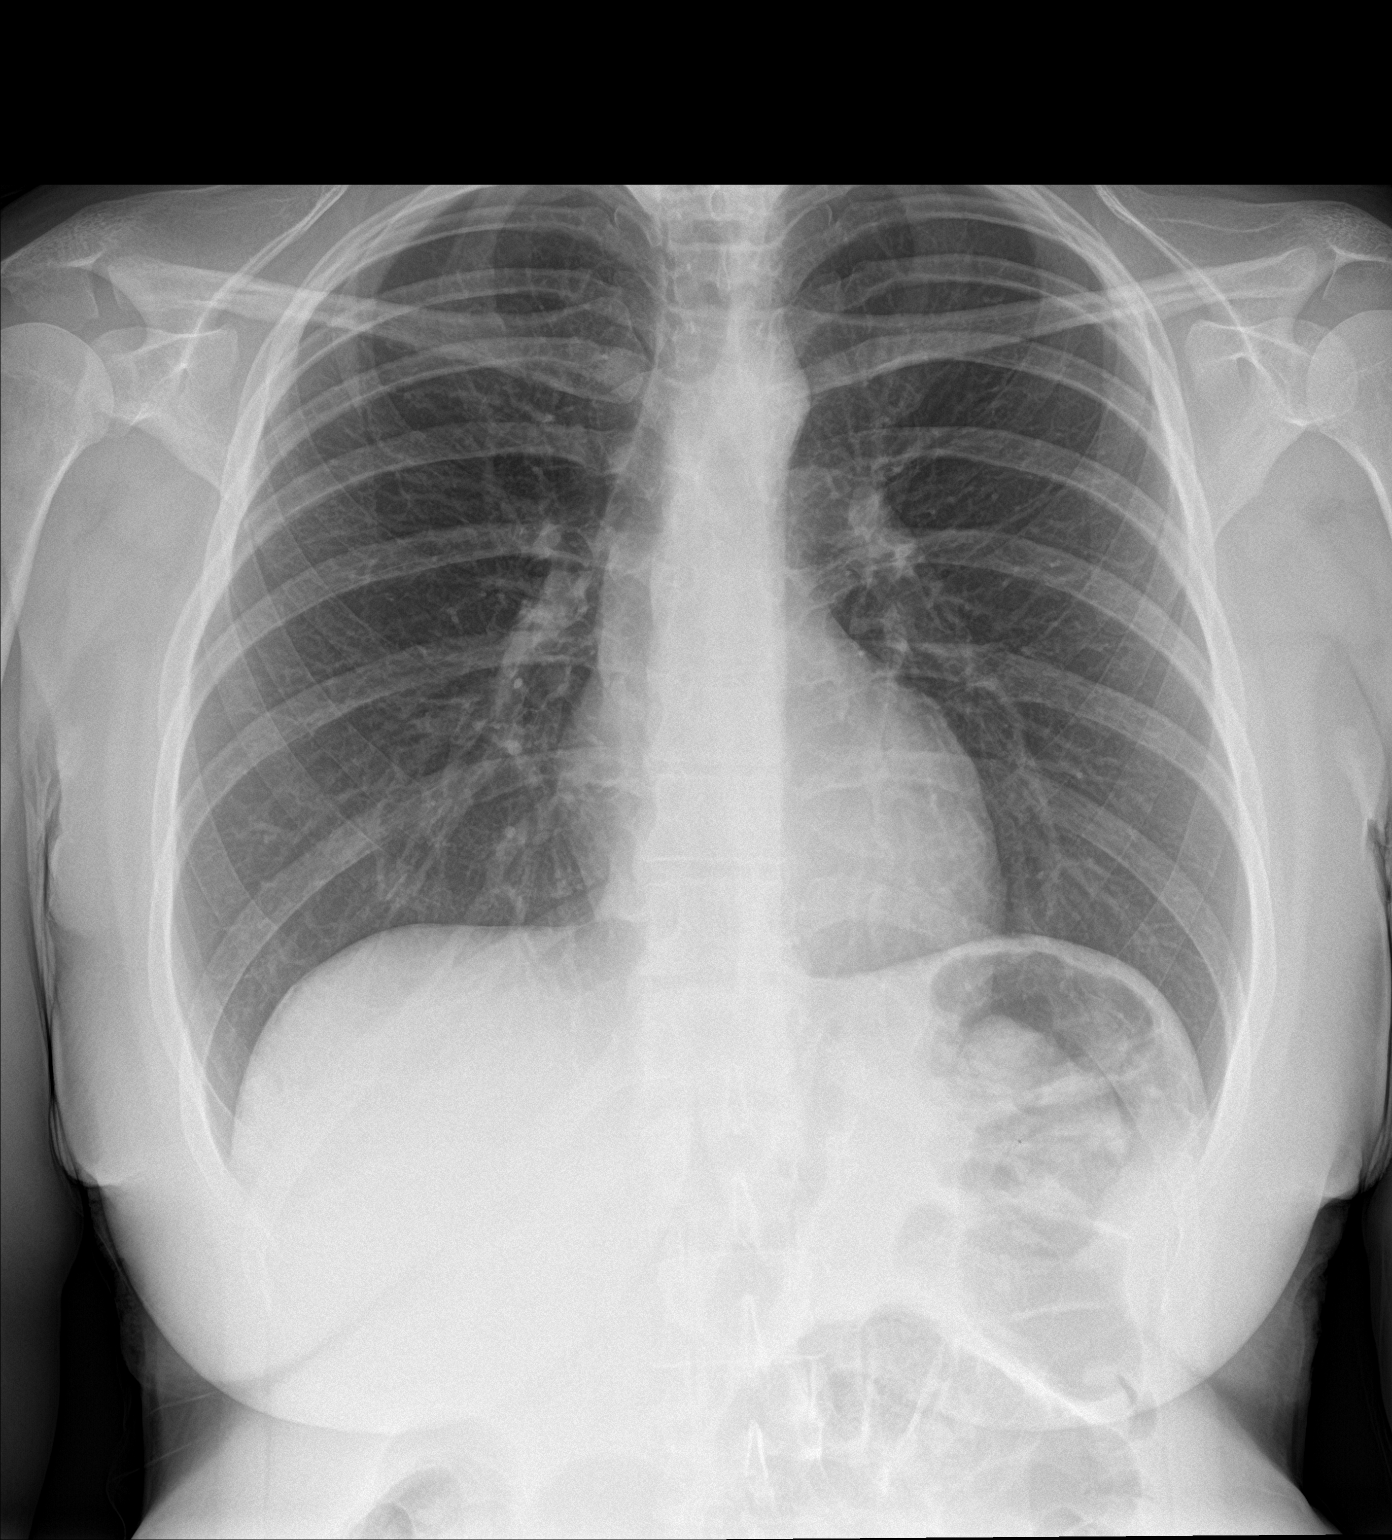

[chest lat]
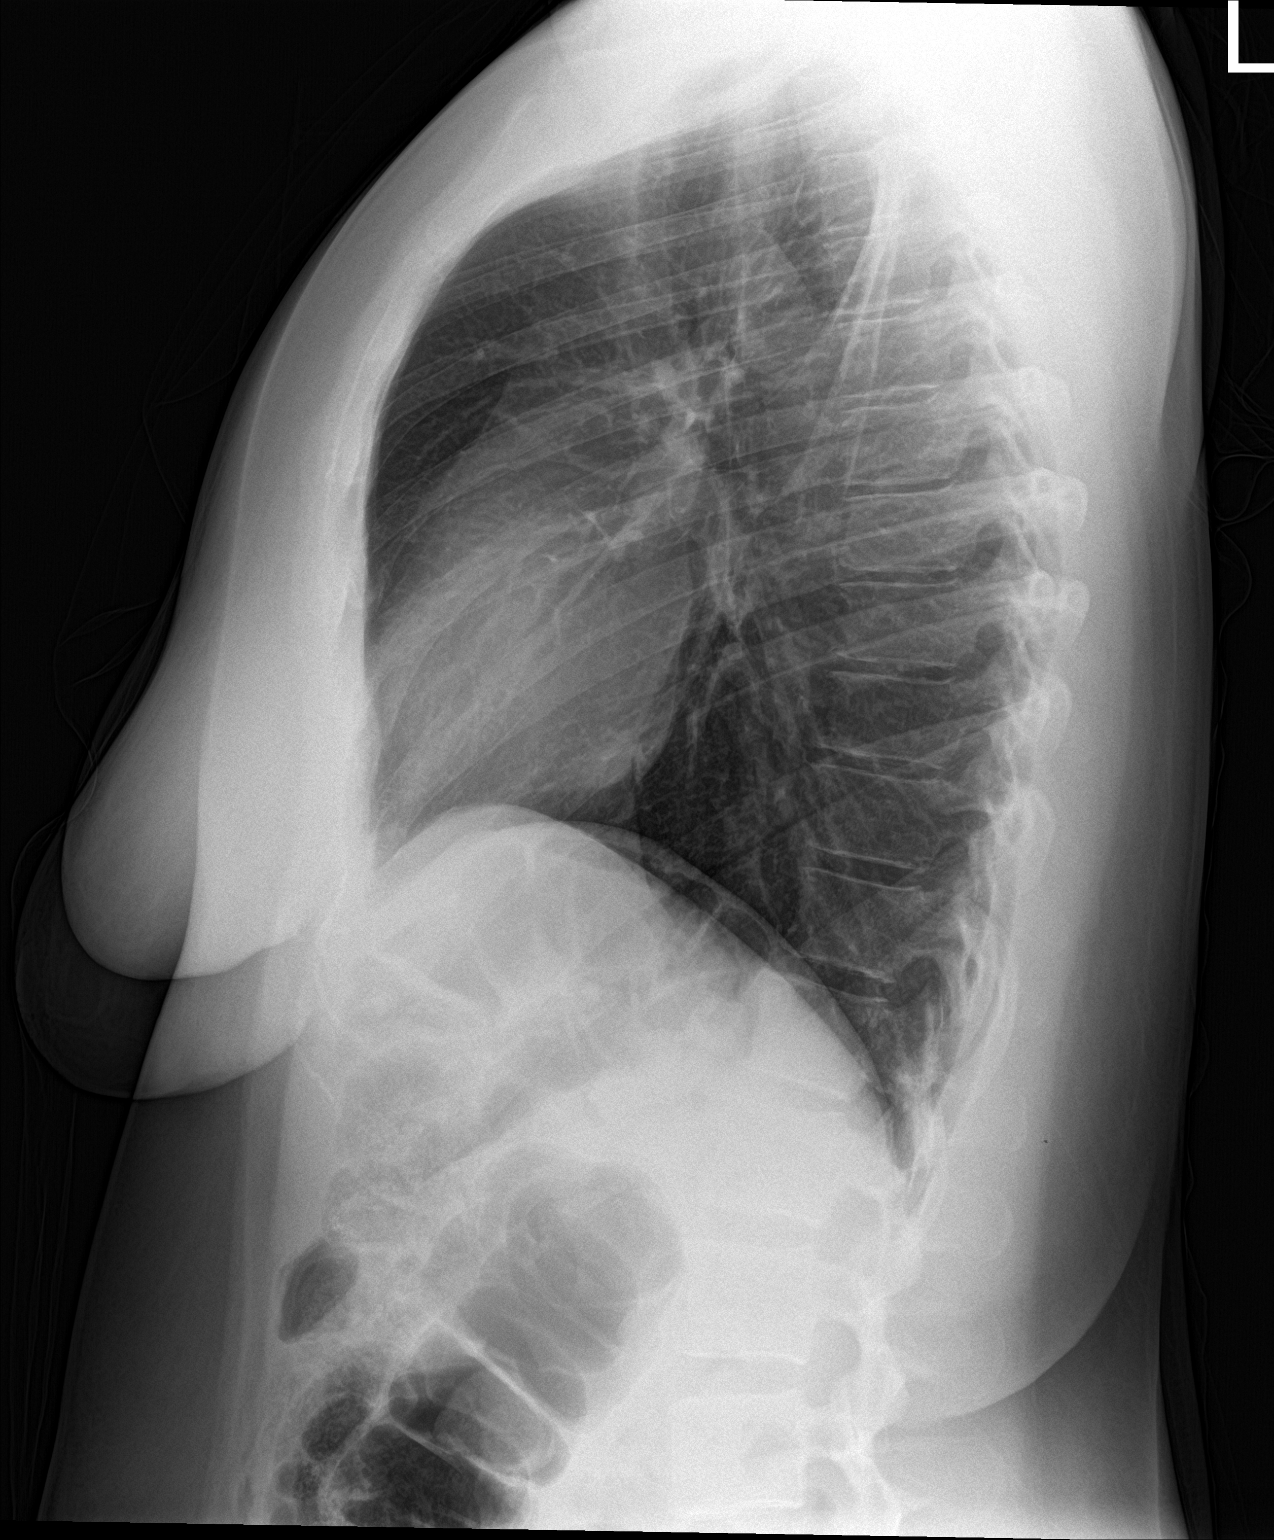

[2 of 2 positions shown; findings below may reference images not displayed]

FINDINGS: The lungs are mildly hyperinflated and clear. The heart and
pulmonary vascularity are normal. The mediastinum is normal in
width. There is no pleural effusion. The bony thorax is
unremarkable.
IMPRESSION: There is no active cardiopulmonary disease.

## 2016-05-19 ENCOUNTER — Other Ambulatory Visit: Payer: Self-pay | Admitting: Physician Assistant

## 2016-05-24 ENCOUNTER — Encounter: Payer: Self-pay | Admitting: Physician Assistant

## 2016-05-24 ENCOUNTER — Other Ambulatory Visit: Payer: Self-pay | Admitting: Physician Assistant

## 2016-05-24 MED ORDER — BENZONATATE 200 MG PO CAPS: 200.0000 mg | ORAL_CAPSULE | Freq: Two times a day (BID) | ORAL | 0 refills | Status: DC | PRN

## 2016-05-27 ENCOUNTER — Other Ambulatory Visit: Payer: Self-pay | Admitting: Physician Assistant

## 2016-05-27 ENCOUNTER — Encounter: Payer: Self-pay | Admitting: Physician Assistant

## 2016-05-27 MED ORDER — MELOXICAM 15 MG PO TABS: 15.0000 mg | ORAL_TABLET | Freq: Every day | ORAL | 1 refills | Status: DC

## 2016-05-27 NOTE — Telephone Encounter (Signed)
Pt called with back pain after falling off son's bed. Sent mobic to use once a day. Continue other symptomatic care.

## 2016-05-30 ENCOUNTER — Other Ambulatory Visit: Payer: Self-pay | Admitting: Physician Assistant

## 2016-05-31 ENCOUNTER — Other Ambulatory Visit: Payer: Self-pay | Admitting: Physician Assistant

## 2016-06-03 DIAGNOSIS — K909 Intestinal malabsorption, unspecified: Secondary | ICD-10-CM | POA: Diagnosis not present

## 2016-06-12 ENCOUNTER — Encounter: Payer: Self-pay | Admitting: Physician Assistant

## 2016-06-13 ENCOUNTER — Other Ambulatory Visit: Payer: Self-pay | Admitting: Physician Assistant

## 2016-06-13 DIAGNOSIS — K909 Intestinal malabsorption, unspecified: Secondary | ICD-10-CM | POA: Diagnosis not present

## 2016-06-13 DIAGNOSIS — F329 Major depressive disorder, single episode, unspecified: Secondary | ICD-10-CM | POA: Diagnosis not present

## 2016-06-13 DIAGNOSIS — Z9889 Other specified postprocedural states: Secondary | ICD-10-CM | POA: Diagnosis not present

## 2016-06-14 ENCOUNTER — Other Ambulatory Visit: Payer: Self-pay | Admitting: Physician Assistant

## 2016-06-17 DIAGNOSIS — R635 Abnormal weight gain: Secondary | ICD-10-CM | POA: Diagnosis not present

## 2016-06-17 DIAGNOSIS — Z6832 Body mass index (BMI) 32.0-32.9, adult: Secondary | ICD-10-CM | POA: Diagnosis not present

## 2016-06-17 DIAGNOSIS — F329 Major depressive disorder, single episode, unspecified: Secondary | ICD-10-CM | POA: Diagnosis not present

## 2016-06-17 DIAGNOSIS — K912 Postsurgical malabsorption, not elsewhere classified: Secondary | ICD-10-CM | POA: Diagnosis not present

## 2016-06-17 DIAGNOSIS — E6609 Other obesity due to excess calories: Secondary | ICD-10-CM | POA: Diagnosis not present

## 2016-07-16 DIAGNOSIS — F3181 Bipolar II disorder: Secondary | ICD-10-CM | POA: Diagnosis not present

## 2016-07-16 DIAGNOSIS — F340 Cyclothymic disorder: Secondary | ICD-10-CM | POA: Diagnosis not present

## 2016-07-22 DIAGNOSIS — F329 Major depressive disorder, single episode, unspecified: Secondary | ICD-10-CM | POA: Diagnosis not present

## 2016-07-22 DIAGNOSIS — E611 Iron deficiency: Secondary | ICD-10-CM | POA: Diagnosis not present

## 2016-07-22 DIAGNOSIS — Z6833 Body mass index (BMI) 33.0-33.9, adult: Secondary | ICD-10-CM | POA: Diagnosis not present

## 2016-07-22 DIAGNOSIS — R635 Abnormal weight gain: Secondary | ICD-10-CM | POA: Diagnosis not present

## 2016-07-22 DIAGNOSIS — E6609 Other obesity due to excess calories: Secondary | ICD-10-CM | POA: Diagnosis not present

## 2016-07-30 DIAGNOSIS — Z6834 Body mass index (BMI) 34.0-34.9, adult: Secondary | ICD-10-CM | POA: Diagnosis not present

## 2016-07-30 DIAGNOSIS — Z01419 Encounter for gynecological examination (general) (routine) without abnormal findings: Secondary | ICD-10-CM | POA: Diagnosis not present

## 2016-08-02 ENCOUNTER — Other Ambulatory Visit: Payer: Self-pay | Admitting: Physician Assistant

## 2016-08-02 ENCOUNTER — Encounter: Payer: Self-pay | Admitting: Physician Assistant

## 2016-08-02 MED ORDER — BENZONATATE 200 MG PO CAPS: 200.0000 mg | ORAL_CAPSULE | Freq: Two times a day (BID) | ORAL | 0 refills | Status: DC | PRN

## 2016-08-20 ENCOUNTER — Other Ambulatory Visit: Payer: Self-pay | Admitting: Physician Assistant

## 2016-08-22 ENCOUNTER — Other Ambulatory Visit: Payer: Self-pay | Admitting: *Deleted

## 2016-09-03 DIAGNOSIS — Z30430 Encounter for insertion of intrauterine contraceptive device: Secondary | ICD-10-CM | POA: Diagnosis not present

## 2016-09-03 DIAGNOSIS — Z3202 Encounter for pregnancy test, result negative: Secondary | ICD-10-CM | POA: Diagnosis not present

## 2016-09-06 ENCOUNTER — Telehealth: Payer: Self-pay | Admitting: Physician Assistant

## 2016-09-06 ENCOUNTER — Other Ambulatory Visit: Payer: Self-pay | Admitting: Physician Assistant

## 2016-09-06 MED ORDER — TRAZODONE HCL 50 MG PO TABS: 25.0000 mg | ORAL_TABLET | Freq: Every evening | ORAL | 0 refills | Status: DC | PRN

## 2016-09-06 NOTE — Telephone Encounter (Signed)
Pt came in with son for office visit. She is really struggling to sleep. She has tried valarian root, benadryl, melatonin. She would like to try something else. Will give trazodone for one month then follow up in clinic. Start with 1/2 tablet at bedtime may increase to 1 tablet if needed. Tandy GawJade Breeback PA-C

## 2016-09-13 ENCOUNTER — Other Ambulatory Visit: Payer: Self-pay | Admitting: Physician Assistant

## 2016-09-13 DIAGNOSIS — F3181 Bipolar II disorder: Secondary | ICD-10-CM | POA: Diagnosis not present

## 2016-09-13 DIAGNOSIS — F340 Cyclothymic disorder: Secondary | ICD-10-CM | POA: Diagnosis not present

## 2016-09-16 NOTE — Telephone Encounter (Signed)
Make sure patient is aware that ongoing use of NSAId can create ulcers and/or GI upset. Please stop if these symptoms occur and use as needed for pain.

## 2016-09-16 NOTE — Telephone Encounter (Signed)
Pt informed. Pt understood and was agreeable. 

## 2016-09-27 ENCOUNTER — Encounter: Payer: Self-pay | Admitting: Physician Assistant

## 2016-10-01 ENCOUNTER — Other Ambulatory Visit: Payer: Self-pay | Admitting: Physician Assistant

## 2016-10-03 ENCOUNTER — Encounter: Payer: Self-pay | Admitting: Physician Assistant

## 2016-10-03 ENCOUNTER — Other Ambulatory Visit: Payer: Self-pay | Admitting: Physician Assistant

## 2016-10-03 MED ORDER — TRAZODONE HCL 100 MG PO TABS: 100.0000 mg | ORAL_TABLET | Freq: Every day | ORAL | 5 refills | Status: DC

## 2016-10-12 ENCOUNTER — Other Ambulatory Visit: Payer: Self-pay | Admitting: Physician Assistant

## 2016-10-24 DIAGNOSIS — F4323 Adjustment disorder with mixed anxiety and depressed mood: Secondary | ICD-10-CM | POA: Diagnosis not present

## 2016-10-29 DIAGNOSIS — F4323 Adjustment disorder with mixed anxiety and depressed mood: Secondary | ICD-10-CM | POA: Diagnosis not present

## 2016-10-30 DIAGNOSIS — F4323 Adjustment disorder with mixed anxiety and depressed mood: Secondary | ICD-10-CM | POA: Diagnosis not present

## 2016-11-15 ENCOUNTER — Ambulatory Visit (INDEPENDENT_AMBULATORY_CARE_PROVIDER_SITE_OTHER): Payer: BLUE CROSS/BLUE SHIELD | Admitting: Osteopathic Medicine

## 2016-11-15 ENCOUNTER — Encounter: Payer: Self-pay | Admitting: Osteopathic Medicine

## 2016-11-15 ENCOUNTER — Telehealth: Payer: Self-pay

## 2016-11-15 VITALS — BP 108/76 | HR 106 | Ht 65.0 in | Wt 203.0 lb

## 2016-11-15 DIAGNOSIS — M545 Low back pain, unspecified: Secondary | ICD-10-CM

## 2016-11-15 MED ORDER — BACLOFEN 10 MG PO TABS: 10.0000 mg | ORAL_TABLET | Freq: Three times a day (TID) | ORAL | 0 refills | Status: DC

## 2016-11-15 MED ORDER — HYDROCODONE-ACETAMINOPHEN 5-325 MG PO TABS: 1.0000 | ORAL_TABLET | ORAL | 0 refills | Status: DC | PRN

## 2016-11-15 MED ORDER — METHOCARBAMOL 500 MG PO TABS: 500.0000 mg | ORAL_TABLET | Freq: Three times a day (TID) | ORAL | 0 refills | Status: DC | PRN

## 2016-11-15 NOTE — Telephone Encounter (Signed)
Robaxin 500 mg and 750 mg are on back order. They are asking for a replacement Rx. Please advise. -EH/RMA

## 2016-11-15 NOTE — Patient Instructions (Addendum)
Plan: Continue Meloxicam Can add Hydrocodone-Acetaminophen for short course Can use Robaxin muscle relaxer as needed - would not take this with the Hydrocodone Stop Flexeril  Avoid Tylenol use with Hydrocodone-Acetaminophen  If pain no better, will need Xray +/- physical therapy

## 2016-11-15 NOTE — Progress Notes (Signed)
HPI: Deanna Keller is a 36 y.o. female  who presents to [facility] today, 11/15/16,  for chief complaint of:  Chief Complaint  Patient presents with  . Fall    Fell last night onto bottom/lower back. Hurting today on right lower side. Has tried cyclobenzaprine, Tylenol, and meloxicam and nothing seems to be helping.   Past medical history, surgical history, social history and family history reviewed.  Patient Active Problem List   Diagnosis Date Noted  . Acute bronchitis 12/26/2015  . Generalized anxiety disorder 09/06/2015  . Benzodiazepine misuse 09/06/2015  . Viral URI 12/20/2014  . IUD check up 11/24/2014  . Left lumbar radiculitis 10/11/2014  . Generalized seizure ([place]) 08/30/2014  . Lumbar degenerative disc disease 06/20/2014  . Recovering alcoholic in remission ([place]) 06/16/2014  . S/P gastric bypass 11/11/2013  . Vitamin D deficiency 11/11/2013  . Depression 11/10/2013  . Elevated LFTs 11/10/2013  . Chronic migraine 11/10/2013  . Malabsorption 11/10/2013  . History of alcoholism ([place]) 07/21/2013  . B12 deficiency 07/21/2013  . Iron deficiency 07/21/2013  . Hypoglycemia following gastrointestinal surgery 04/12/2013  . Postoperative blind loop syndrome 04/12/2013  . Bariatric surgery status in pregnancy 02/10/2013  . Phlebectasia 10/21/2011    Current medication list and allergy/intolerance information reviewed.   Current Outpatient Prescriptions on File Prior to Visit  Medication Sig Dispense Refill  . ALPRAZolam (XANAX) 1 MG tablet Take 1 mg by mouth 2 (two) times daily.  1  . Cyanocobalamin 1000 MCG/ML KIT Inject 1 mL every two weeks. 20 kit 2  . cyclobenzaprine (FLEXERIL) 10 MG tablet ONE BY MOUTH UP TO THREE TIMES A DAY AS NEEDED FOR PAIN OR SPASMS. 30 tablet 2  . Fe Fum-Fe Poly-Vit C-Lactobac (FUSION) 65-65-25-30 MG CAPS Take 1 capsule by mouth daily. 30 capsule 3  . lamoTRIgine (LAMICTAL) 100 MG tablet TAKE 2 TABLETS BY  MOUTH DAILY X14DAYS, THEN 3 TABS DAILY  1  . meloxicam (MOBIC) 15 MG tablet TAKE 1 TABLET (15 MG TOTAL) BY MOUTH DAILY. 30 tablet 3  . pramipexole (MIRAPEX) 0.25 MG tablet Take 0.25 mg by mouth 3 (three) times daily.    . traZODone (DESYREL) 100 MG tablet Take 1 tablet (100 mg total) by mouth at bedtime. 30 tablet 5  . traZODone (DESYREL) 50 MG tablet TAKE 0.5-1 TABLETS (25-50 MG TOTAL) BY MOUTH AT BEDTIME AS NEEDED FOR SLEEP. 30 tablet 0   No current facility-administered medications on file prior to visit.    Allergies  Allergen Reactions  . Tramadol Other (See Comments)    Possible seizure  . Augmentin [Amoxicillin-Pot Clavulanate]     vomiting      Review of Systems:  HEENT: No  headache  Cardiac: No  chest pain  Respiratory:  No  shortness of breath  Musculoskeletal: +new myalgia/arthralgia: back pain as per hpi  Skin: No  Rash/bruise  Neurologic: No  weakness, No  Dizziness   Exam:  BP 108/76   Pulse (!) 106   Ht _0  (1.651 m)   Wt 203 lb (92.1 kg)   BMI 33.78 kg/m   Constitutional: VS see above. General Appearance: alert, well-developed, well-nourished, NAD  Eyes: Normal lids and conjunctive, non-icteric sclera  Ears, Nose, Mouth, Throat: MMM, Normal external inspection ears/nares/mouth/lips/gums.  Neck: No masses, trachea midline.   Respiratory: Normal respiratory effort.   Musculoskeletal: Gait normal. Symmetric and independent movement of all extremities. Paraspinal tenderness left lower back/SI joint area. No bruising, negative straight leg  raise bilaterally.  Neurological: Normal balance/coordination. No tremor.  Skin: warm, dry, intact.   Psychiatric: Normal judgment/insight. Normal mood and affect. Oriented x3.     ASSESSMENT/PLAN:   Acute left-sided low back pain without sciatica   Patient Instructions  Plan: Continue Meloxicam Can add Hydrocodone-Acetaminophen for short course Can use Robaxin muscle relaxer as needed - would not  take this with the Hydrocodone Stop Flexeril  Avoid Tylenol use with Hydrocodone-Acetaminophen  If pain no better, will need Xray +/- physical therapy     Follow-up plan: Return if symptoms worsen or fail to improve.  Visit summary with medication list and pertinent instructions was printed for patient to review, alert [place] if any changes needed. All questions at time of visit were answered - patient instructed to contact office with any additional concerns. ER/RTC precautions were reviewed with the patient and understanding verbalized.

## 2016-11-15 NOTE — Telephone Encounter (Signed)
Sent!

## 2016-11-21 DIAGNOSIS — Z3202 Encounter for pregnancy test, result negative: Secondary | ICD-10-CM | POA: Diagnosis not present

## 2016-11-21 DIAGNOSIS — Z30431 Encounter for routine checking of intrauterine contraceptive device: Secondary | ICD-10-CM | POA: Diagnosis not present

## 2016-11-22 ENCOUNTER — Encounter: Payer: Self-pay | Admitting: Physician Assistant

## 2016-11-22 DIAGNOSIS — F418 Other specified anxiety disorders: Secondary | ICD-10-CM

## 2016-12-02 DIAGNOSIS — T8339XA Other mechanical complication of intrauterine contraceptive device, initial encounter: Secondary | ICD-10-CM | POA: Diagnosis not present

## 2016-12-10 ENCOUNTER — Other Ambulatory Visit: Payer: Self-pay | Admitting: Osteopathic Medicine

## 2016-12-23 ENCOUNTER — Other Ambulatory Visit: Payer: Self-pay | Admitting: Physician Assistant

## 2016-12-23 ENCOUNTER — Encounter (HOSPITAL_COMMUNITY): Payer: Self-pay | Admitting: Psychiatry

## 2016-12-23 ENCOUNTER — Ambulatory Visit (INDEPENDENT_AMBULATORY_CARE_PROVIDER_SITE_OTHER): Payer: BLUE CROSS/BLUE SHIELD | Admitting: Psychiatry

## 2016-12-23 VITALS — BP 122/76 | HR 79 | Resp 16 | Ht 65.0 in | Wt 205.0 lb

## 2016-12-23 DIAGNOSIS — F411 Generalized anxiety disorder: Secondary | ICD-10-CM | POA: Diagnosis not present

## 2016-12-23 DIAGNOSIS — F5102 Adjustment insomnia: Secondary | ICD-10-CM

## 2016-12-23 DIAGNOSIS — Z79899 Other long term (current) drug therapy: Secondary | ICD-10-CM | POA: Diagnosis not present

## 2016-12-23 DIAGNOSIS — F1021 Alcohol dependence, in remission: Secondary | ICD-10-CM | POA: Diagnosis not present

## 2016-12-23 DIAGNOSIS — Z818 Family history of other mental and behavioral disorders: Secondary | ICD-10-CM

## 2016-12-23 DIAGNOSIS — F0630 Mood disorder due to known physiological condition, unspecified: Secondary | ICD-10-CM

## 2016-12-23 DIAGNOSIS — F17210 Nicotine dependence, cigarettes, uncomplicated: Secondary | ICD-10-CM | POA: Diagnosis not present

## 2016-12-23 MED ORDER — HYDROXYZINE HCL 25 MG PO TABS: 25.0000 mg | ORAL_TABLET | Freq: Every evening | ORAL | 0 refills | Status: DC | PRN

## 2016-12-23 MED ORDER — ESCITALOPRAM OXALATE 5 MG PO TABS: 5.0000 mg | ORAL_TABLET | Freq: Every day | ORAL | 2 refills | Status: DC

## 2016-12-23 NOTE — Patient Instructions (Signed)
Stop xanax in one month. Lower half tablet a week .  Lowe abilify by half tablet for a week and then stop  Start vistaril at night for sleep and anxiety Stop trazadone. Not taking propranolol  Start low dose lexapro  qd.
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
## 2016-12-24 ENCOUNTER — Encounter: Payer: Self-pay | Admitting: Physician Assistant

## 2017-01-17 ENCOUNTER — Ambulatory Visit (INDEPENDENT_AMBULATORY_CARE_PROVIDER_SITE_OTHER): Payer: BLUE CROSS/BLUE SHIELD | Admitting: Psychiatry

## 2017-01-17 ENCOUNTER — Encounter (HOSPITAL_COMMUNITY): Payer: Self-pay | Admitting: Psychiatry

## 2017-01-17 DIAGNOSIS — Z79899 Other long term (current) drug therapy: Secondary | ICD-10-CM

## 2017-01-17 DIAGNOSIS — F5102 Adjustment insomnia: Secondary | ICD-10-CM

## 2017-01-17 DIAGNOSIS — F411 Generalized anxiety disorder: Secondary | ICD-10-CM

## 2017-01-17 DIAGNOSIS — F0630 Mood disorder due to known physiological condition, unspecified: Secondary | ICD-10-CM

## 2017-01-17 DIAGNOSIS — F410 Panic disorder [episodic paroxysmal anxiety] without agoraphobia: Secondary | ICD-10-CM

## 2017-01-17 DIAGNOSIS — Z818 Family history of other mental and behavioral disorders: Secondary | ICD-10-CM | POA: Diagnosis not present

## 2017-01-17 DIAGNOSIS — F17210 Nicotine dependence, cigarettes, uncomplicated: Secondary | ICD-10-CM | POA: Diagnosis not present

## 2017-01-17 DIAGNOSIS — F1021 Alcohol dependence, in remission: Secondary | ICD-10-CM

## 2017-01-17 DIAGNOSIS — Z9884 Bariatric surgery status: Secondary | ICD-10-CM | POA: Diagnosis not present

## 2017-01-17 MED ORDER — LAMOTRIGINE 100 MG PO TABS: ORAL_TABLET | ORAL | 1 refills | Status: DC

## 2017-01-17 MED ORDER — HYDROXYZINE HCL 25 MG PO TABS: 25.0000 mg | ORAL_TABLET | Freq: Two times a day (BID) | ORAL | 0 refills | Status: DC | PRN

## 2017-01-17 MED ORDER — ESCITALOPRAM OXALATE 10 MG PO TABS: 10.0000 mg | ORAL_TABLET | Freq: Every day | ORAL | 0 refills | Status: DC

## 2017-01-17 MED ORDER — PRAMIPEXOLE DIHYDROCHLORIDE 0.25 MG PO TABS
0.2500 mg | ORAL_TABLET | Freq: Every day | ORAL | 0 refills | Status: DC
Start: 2017-01-17 — End: 2019-07-26

## 2017-01-17 NOTE — Progress Notes (Signed)
[facility] Outpatient Follow up visit   Patient Identification: Deanna Keller MRN:  [id number] Date of Evaluation:  01/17/2017 Referral Source: Luvenia Starch , Dr. Madilyn Fireman Chief Complaint:   Chief Complaint    Follow-up     Visit Diagnosis:    ICD-10-CM   1. Mood disorder in conditions classified elsewhere F06.30   2. Generalized anxiety disorder F41.1   3. Alcohol use disorder, moderate, in sustained remission (HCC) F10.21   4. Adjustment insomnia F51.02   5. Panic disorder  History of Present Illness:  36 years old currently married Caucasian female initially referred by primary care physician for management of anxiety also she must rule out bipolar or have second opinion She has been seeing Dr. Galen [place] psychiatrist at [facility].  Patient was not convinced that she has bipolar she has mood upsets or episodes of increased energy last visit she kept on Lamictal and we stopped Abilify started Lexapro for depression and anxiety as well as she's not drinking any alcohol she does have insomnia Mirapex was started apparently for depression but that does somewhat help sleep.  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  Consequences of Substance Abuse: NA  Past Medical History:  Past Medical History:  Diagnosis Date  . Alcohol abuse    sober since 2011  . Anxiety     Past Surgical History:  Procedure Laterality Date  . CERVICAL CONE BIOPSY    . GASTRIC BYPASS  2005      Family History:  Family History  Problem Relation Age of Onset  . Colon cancer Mother   .  Depression Mother   . Hypertension Father     Social History:   Social History   Social History  . Marital status: Married    Spouse name: N/A  . Number of children: N/A  . Years of education: N/A   Social History Main Topics  . Smoking status: Current Every Day Smoker    Packs/day: 0.50    Years: 10.00    Types: Cigarettes    Start date: 07/21/2012  . Smokeless tobacco: Never Used  . Alcohol use No  . Drug use: No  . Sexual activity: Yes    Partners: Male   Other Topics Concern  . Not on file   Social History Narrative  . No narrative on file       Allergies:   Allergies  Allergen Reactions  . Tramadol Other (See Comments)    Possible seizure  . Augmentin [Amoxicillin-Pot Clavulanate]     vomiting    Metabolic Disorder Labs: No results found for: HGBA1C, MPG No results found for: PROLACTIN Lab Results  Component Value Date   CHOL 158 05/15/2011   TRIG 50 05/15/2011   HDL 76 (A) 05/15/2011   LDLCALC 72 05/15/2011     Current Medications: Current Outpatient Prescriptions  Medication Sig Dispense Refill  . baclofen (LIORESAL) 10 MG tablet TAKE 1 TABLET (10 MG TOTAL) BY MOUTH 3 (THREE) TIMES DAILY. AS NEEDED FOR MUSCLE SPASM 30 tablet 0  . Cyanocobalamin 1000 MCG/ML KIT Inject 1  mL every two weeks. 20 kit 2  . escitalopram (LEXAPRO) 10 MG tablet Take 1 tablet (10 mg total) by mouth daily. 30 tablet 0  . hydrOXYzine (ATARAX/VISTARIL) 25 MG tablet Take 1 tablet (25 mg total) by mouth 2 (two) times daily as needed. 60 tablet 0  . lamoTRIgine (LAMICTAL) 100 MG tablet 3x times daily 90 tablet 1  . meloxicam (MOBIC) 15 MG tablet TAKE 1 TABLET (15 MG TOTAL) BY MOUTH DAILY. 30 tablet 3  . pramipexole (MIRAPEX) 0.25 MG tablet Take 1 tablet (0.25 mg total) by mouth at bedtime. 30 tablet 0   No current facility-administered medications for this visit.       Psychiatric Specialty Exam: Review of Systems  Cardiovascular: Negative for chest pain.  Skin:  Negative for rash.  Neurological: Negative for tremors.  Psychiatric/Behavioral: The patient is nervous/anxious.     There were no vitals taken for this visit.There is no height or weight on file to calculate BMI.  General Appearance: Casual  Eye Contact:  Fair  Speech:  Normal Rate  Volume:  Decreased  Mood:  Somewhat stressed  Affect:  reactive  Thought Process:  Goal Directed  Orientation:  Full (Time, Place, and Person)  Thought Content:  Rumination  Suicidal Thoughts:  No  Homicidal Thoughts:  No  Memory:  Immediate;   Fair Recent;   Fair  Judgement:  Fair  Insight:  Fair  Psychomotor Activity:  Decreased  Concentration:  Concentration: Fair and Attention Span: Fair  Recall:  [employer] of Knowledge:Fair  Language: Fair  Akathisia:  Negative  Handed:  Right  AIMS (if indicated):    Assets:  Desire for Improvement  ADL's:  Intact  Cognition: WNL  Sleep:  variable    Treatment Plan Summary: Medication management and Plan as follows  1. Mood disorder NOS; no clear mania in past: abilify has been stopped. No mania. She in lamictal will continue 314m Patient wants to be on less meds  2. Panic attacks/GAD: still endorses anxiety but less panic. Will increase lexapro 141m Vistaril to bid  3. Insomnia: doing fair. Continue mirapex not sure of reason it was started, says it was for depression but considering insomnia it may be helping . Will continue for now and consider to stop later  4. Alcohol use: remains sober. Continue AA 5. Nicotine use: has quit, encouraged to remain quit.  Continue therapy. Will follow up 4 weeks. Renewed meds  Questions addressed    AKMerian CapronMD 10/5/20188:51 AM

## 2017-01-19 ENCOUNTER — Other Ambulatory Visit (HOSPITAL_COMMUNITY): Payer: Self-pay | Admitting: Psychiatry

## 2017-01-19 NOTE — Telephone Encounter (Signed)
Medication refill- received fax from [facility] requesting a refill for Vistaril. Per Dr. Gilmore Laroche, refill request is denied. Vistaril rx was sent to pharmacy on 01/17/17. Pt's next apt is schedule on 02/14/17. Nothing further is need at this time.

## 2017-02-13 ENCOUNTER — Other Ambulatory Visit (HOSPITAL_COMMUNITY): Payer: Self-pay | Admitting: Psychiatry

## 2017-02-14 ENCOUNTER — Encounter (HOSPITAL_COMMUNITY): Payer: Self-pay | Admitting: Psychiatry

## 2017-02-14 ENCOUNTER — Ambulatory Visit (INDEPENDENT_AMBULATORY_CARE_PROVIDER_SITE_OTHER): Payer: BLUE CROSS/BLUE SHIELD | Admitting: Psychiatry

## 2017-02-14 VITALS — BP 120/80 | HR 88 | Resp 16 | Ht 65.0 in | Wt 204.0 lb

## 2017-02-14 DIAGNOSIS — F411 Generalized anxiety disorder: Secondary | ICD-10-CM

## 2017-02-14 DIAGNOSIS — F5102 Adjustment insomnia: Secondary | ICD-10-CM | POA: Diagnosis not present

## 2017-02-14 DIAGNOSIS — Z599 Problem related to housing and economic circumstances, unspecified: Secondary | ICD-10-CM | POA: Diagnosis not present

## 2017-02-14 DIAGNOSIS — F39 Unspecified mood [affective] disorder: Secondary | ICD-10-CM

## 2017-02-14 DIAGNOSIS — F0630 Mood disorder due to known physiological condition, unspecified: Secondary | ICD-10-CM | POA: Diagnosis not present

## 2017-02-14 DIAGNOSIS — Z9884 Bariatric surgery status: Secondary | ICD-10-CM

## 2017-02-14 DIAGNOSIS — Z818 Family history of other mental and behavioral disorders: Secondary | ICD-10-CM | POA: Diagnosis not present

## 2017-02-14 DIAGNOSIS — F1021 Alcohol dependence, in remission: Secondary | ICD-10-CM | POA: Diagnosis not present

## 2017-02-14 DIAGNOSIS — Z87891 Personal history of nicotine dependence: Secondary | ICD-10-CM

## 2017-02-14 MED ORDER — LAMOTRIGINE 100 MG PO TABS: ORAL_TABLET | ORAL | 0 refills | Status: DC

## 2017-02-14 MED ORDER — ESCITALOPRAM OXALATE 10 MG PO TABS: 10.0000 mg | ORAL_TABLET | Freq: Every day | ORAL | 0 refills | Status: DC

## 2017-02-14 MED ORDER — HYDROXYZINE HCL 25 MG PO TABS: 25.0000 mg | ORAL_TABLET | Freq: Every day | ORAL | 0 refills | Status: DC | PRN

## 2017-02-14 NOTE — Progress Notes (Signed)
[facility] Outpatient Follow up visit   Patient Identification: Deanna Keller MRN:  [id number] Date of Evaluation:  02/14/2017 Referral Source: Luvenia Starch , Dr. Madilyn Fireman Chief Complaint:   Chief Complaint    Follow-up     Visit Diagnosis:    ICD-10-CM   1. Mood disorder in conditions classified elsewhere F06.30   2. Generalized anxiety disorder F41.1   3. Alcohol use disorder, moderate, in sustained remission (HCC) F10.21   4. Adjustment insomnia F51.02   5. Panic disorder  History of Present Illness:  36 years old currently married Caucasian female initially referred by primary care physician for management of anxiety also she must rule out bipolar or have second opinion She has been seeing Dr. Galen Manila psychiatrist at [facility].  Has been doing better we have stopped the Abilify continued Lamictal she also has stopped Mirapex because she did not believe that she had restless  and also does probably given for depression  Sleep is fair if she takes hydroxyzine when necessary only and mostly at night does help the anxiety.  She works as a Mudlogger in [employer] Work is reasonable she has a supportive husband is no reported side effect no rash mood remains stable as of now with the current medications and she is glad she has cut down 1 or 2 medications and discontinued especially Abilify  Has had gastric bypass 13 years ago. Also she is in sustained remission no alcohol use   Aggravating factor: gastric bypass, finanes Modifying factor: husband, kids    Past Psychiatric History: depression, anxiety, panic symptoms   Previous Psychotropic Medications: Yes   Substance Abuse History in the last 12 months:  No.  Consequences of Substance Abuse: NA  Past Medical History:  Past Medical History:  Diagnosis Date  . Alcohol abuse    sober since 2011  . Anxiety     Past Surgical History:  Procedure Laterality Date  . CERVICAL CONE BIOPSY    . GASTRIC BYPASS   2005      Family History:  Family History  Problem Relation Age of Onset  . Colon cancer Mother   . Depression Mother   . Hypertension Father     Social History:   Social History   Social History  . Marital status: Married    Spouse name: N/A  . Number of children: N/A  . Years of education: N/A   Social History Main Topics  . Smoking status: Former Smoker    Packs/day: 0.50    Years: 10.00    Types: Cigarettes    Start date: 07/21/2012  . Smokeless tobacco: Never Used  . Alcohol use No  . Drug use: No  . Sexual activity: Yes    Partners: Male   Other Topics Concern  . None   Social History Narrative  . None       Allergies:   Allergies  Allergen Reactions  . Tramadol Other (See Comments)    Possible seizure  . Augmentin [Amoxicillin-Pot Clavulanate]     vomiting    Metabolic Disorder Labs: No results found for: HGBA1C, MPG No results found for: PROLACTIN Lab Results  Component Value Date   CHOL 158 05/15/2011   TRIG 50 05/15/2011   HDL 76 (A) 05/15/2011   LDLCALC 72 05/15/2011     Current Medications: Current Outpatient Prescriptions  Medication Sig Dispense Refill  . baclofen (LIORESAL) 10 MG tablet TAKE 1 TABLET (10 MG TOTAL) BY MOUTH 3 (THREE) TIMES DAILY. AS  NEEDED FOR MUSCLE SPASM 30 tablet 0  . Cyanocobalamin 1000 MCG/ML KIT Inject 1 mL every two weeks. 20 kit 2  . escitalopram (LEXAPRO) 10 MG tablet Take 1 tablet (10 mg total) by mouth daily. 30 tablet 0  . hydrOXYzine (ATARAX/VISTARIL) 25 MG tablet Take 1 tablet (25 mg total) by mouth daily as needed. 30 tablet 0  . lamoTRIgine (LAMICTAL) 100 MG tablet 3x times daily 90 tablet 0  . meloxicam (MOBIC) 15 MG tablet TAKE 1 TABLET (15 MG TOTAL) BY MOUTH DAILY. 30 tablet 3   No current facility-administered medications for this visit.       Psychiatric Specialty Exam: Review of Systems  Cardiovascular: Negative for palpitations.  Skin: Negative for rash.  Neurological: Negative for  tremors.  Psychiatric/Behavioral: Negative for depression.    Blood pressure 120/80, pulse 88, resp. rate 16, height 5' 5"  (1.651 m), weight 204 lb (92.5 kg), SpO2 98 %.Body mass index is 33.95 kg/m.  General Appearance: Casual  Eye Contact:  Fair  Speech:  Normal Rate  Volume:  Decreased  Mood: fair  Affect:  Reactive pleasant  Thought Process:  Goal Directed  Orientation:  Full (Time, Place, and Person)  Thought Content:  Rumination  Suicidal Thoughts:  No  Homicidal Thoughts:  No  Memory:  Immediate;   Fair Recent;   Fair  Judgement:  Fair  Insight:  Fair  Psychomotor Activity:  Decreased  Concentration:  Concentration: Fair and Attention Span: Fair  Recall:  [employer] of Knowledge:Fair  Language: Fair  Akathisia:  Negative  Handed:  Right  AIMS (if indicated):    Assets:  Desire for Improvement  ADL's:  Intact  Cognition: WNL  Sleep:  variable    Treatment Plan Summary: Medication management and Plan as follows  1. Mood disorder NOS; no clear mania in past:  Stable mood . Continue lamictal. Dc abilify already 2. Panic attacks/GAD: vistaril helps. Prn. Continue lexapro 3. Insomnia: not worse without mirapex   4. Alcohol use: sober more then 6 years 5. Nicotine use: none for now Provided supportive therapy discussed the side effects and medication mood remains stable follow-up in 3 months renewed prescriptions    De Nurse, Brach Birdsall, MD 11/2/20188:53 AM

## 2017-02-20 ENCOUNTER — Ambulatory Visit (INDEPENDENT_AMBULATORY_CARE_PROVIDER_SITE_OTHER): Payer: BLUE CROSS/BLUE SHIELD | Admitting: Physician Assistant

## 2017-02-20 ENCOUNTER — Encounter: Payer: Self-pay | Admitting: Physician Assistant

## 2017-02-20 VITALS — BP 108/79 | HR 72 | Wt 208.2 lb

## 2017-02-20 DIAGNOSIS — M5442 Lumbago with sciatica, left side: Secondary | ICD-10-CM

## 2017-02-20 DIAGNOSIS — M5136 Other intervertebral disc degeneration, lumbar region: Secondary | ICD-10-CM

## 2017-02-20 DIAGNOSIS — M5441 Lumbago with sciatica, right side: Secondary | ICD-10-CM

## 2017-02-20 MED ORDER — CYCLOBENZAPRINE HCL 10 MG PO TABS: 10.0000 mg | ORAL_TABLET | Freq: Every day | ORAL | 0 refills | Status: DC

## 2017-02-20 MED ORDER — DICLOFENAC SODIUM 75 MG PO TBEC: 75.0000 mg | DELAYED_RELEASE_TABLET | Freq: Two times a day (BID) | ORAL | 0 refills | Status: DC

## 2017-02-20 MED ORDER — PREDNISONE 50 MG PO TABS: ORAL_TABLET | ORAL | 0 refills | Status: DC

## 2017-02-20 NOTE — Progress Notes (Signed)
HPI:                                                                Deanna Keller is a 36 y.o. female who presents to [place]: [place] today for back pain  Back Pain  This is a new problem. The current episode started yesterday. The problem occurs constantly. The problem is unchanged. The pain is present in the lumbar spine. The quality of the pain is described as aching and shooting. The pain radiates to the left thigh and right thigh. The pain is moderate. The pain is the same all the time. The symptoms are aggravated by bending and sitting. Stiffness is present all day. Associated symptoms include leg pain (bilateral thighs). Pertinent negatives include no bladder incontinence, bowel incontinence, paresis, paresthesias, perianal numbness, tingling, weakness or weight loss. Risk factors include obesity and poor posture. She has tried NSAIDs and heat for the symptoms. The treatment provided no relief.     Past Medical History:  Diagnosis Date  . Alcohol abuse    sober since 2011  . Anxiety    Past Surgical History:  Procedure Laterality Date  . CERVICAL CONE BIOPSY    . GASTRIC BYPASS  2005   Social History   Tobacco Use  . Smoking status: Former Smoker    Packs/day: 0.50    Years: 10.00    Pack years: 5.00    Types: Cigarettes    Start date: 07/21/2012  . Smokeless tobacco: Never Used  Substance Use Topics  . Alcohol use: No   family history includes Colon cancer in her mother; Depression in her mother; Hypertension in her father.  ROS: negative except as noted in the HPI  Medications: Current Outpatient Medications  Medication Sig Dispense Refill  . Cyanocobalamin 1000 MCG/ML KIT Inject 1 mL every two weeks. 20 kit 2  . escitalopram (LEXAPRO) 10 MG tablet Take 1 tablet (10 mg total) by mouth daily. 30 tablet 0  . hydrOXYzine (ATARAX/VISTARIL) 25 MG tablet Take 1 tablet (25 mg total) by mouth daily as needed. 30 tablet 0  .  lamoTRIgine (LAMICTAL) 100 MG tablet 3x times daily 90 tablet 0  . cyclobenzaprine (FLEXERIL) 10 MG tablet Take 1 tablet (10 mg total) at bedtime by mouth. 30 tablet 0  . diclofenac (VOLTAREN) 75 MG EC tablet Take 1 tablet (75 mg total) 2 (two) times daily by mouth. 60 tablet 0  . predniSONE (DELTASONE) 50 MG tablet One tab PO daily for 5 days. 5 tablet 0   No current facility-administered medications for this visit.    Allergies  Allergen Reactions  . Tramadol Other (See Comments)    Possible seizure  . Augmentin [Amoxicillin-Pot Clavulanate]     vomiting       Objective:  BP 108/79   Pulse 72   Wt 208 lb 4 oz (94.5 kg)   SpO2 98%   BMI 34.65 kg/m  Gen:  alert, not ill-appearing, no distress, appropriate for age, obese female HEENT: head normocephalic without obvious abnormality, conjunctiva and cornea clear, trachea midline Pulm: Normal work of breathing, normal phonation Neuro: alert and oriented x 3, no tremor, DTR's intact, sensation grossly intact MSK: spine atraumatic, no spinous process tenderness, bilateral low back pain  worse with flexion and lateral bend, positive straight leg raise bilaterally, strenth 5/5 and symmetric in bilateral lower extremities, extremities atraumatic, normal gait and station Skin: intact, no rashes on exposed skin, no jaundice, no cyanosis  No flowsheet data found.   No results found for this or any previous visit (from the past 72 hour(s)). No results found.   Assessment and Plan: 36 y.o. female with   1. Lumbar degenerative disc disease   2. Acute bilateral low back pain with bilateral sciatica - acute on chronic low back pain brought on by prolonged sitting, no trauma or acute injury, no red flag symptoms. Known Lumbosacral spondylosis without myelopathy. L5-S1 degenerative disc disease bilateral foraminal stenosis and bilateral L5 radiculopathy - steroid burst, flexeril nightly, followed by daily anti-inflammatory for 1-2 weeks.  Patient declined referral to physical therapy. Provided with home rehab exercises. Also recommended TENS unit and heat. - she did request narcotic pain medication. Explained that this is not indicated in low back pain in the absence of trauma/infection and there is no evidence of its benefit. Mainstay of treatment is aggressive physical therapy and anti-inflammatories as needed. - predniSONE (DELTASONE) 50 MG tablet; One tab PO daily for 5 days.  Dispense: 5 tablet; Refill: 0 - cyclobenzaprine (FLEXERIL) 10 MG tablet; Take 1 tablet (10 mg total) at bedtime by mouth.  Dispense: 30 tablet; Refill: 0 - diclofenac (VOLTAREN) 75 MG EC tablet; Take 1 tablet (75 mg total) 2 (two) times daily by mouth.  Dispense: 60 tablet; Refill: 0  Patient education and anticipatory guidance given Patient agrees with treatment plan Follow-up in 4 weeks or sooner as needed if symptoms worsen or fail to improve  Darlyne Russian PA-C

## 2017-02-20 NOTE — Patient Instructions (Addendum)
-   Stop Meloxicam - Start Prednisone, daily for 5 days - Once completed Prednisone, start Diclofenac twice a day. Do not combine with any other OTC pain relievers except Tylenol - Flexeril at bedtime and up to three times daily (may cause drowsiness) - Continue Ice or Heat x 20 minutes, 3 times per day - TENS unit - Rehab exercises

## 2017-02-24 ENCOUNTER — Encounter: Payer: Self-pay | Admitting: Physician Assistant

## 2017-02-26 NOTE — Telephone Encounter (Signed)
Medication refill- [facility]. Per Dr. Gilmore LarocheAkhtar, refill request is denied. Medication was discontinue on 02/14/17. Called and informed pharmacy. Nothing further is need at this time.

## 2017-03-10 ENCOUNTER — Encounter: Payer: Self-pay | Admitting: Physician Assistant

## 2017-03-10 ENCOUNTER — Ambulatory Visit: Payer: BLUE CROSS/BLUE SHIELD | Admitting: Physician Assistant

## 2017-03-10 VITALS — BP 119/75 | HR 97 | Temp 98.6°F | Ht 65.0 in | Wt 203.0 lb

## 2017-03-10 DIAGNOSIS — J40 Bronchitis, not specified as acute or chronic: Secondary | ICD-10-CM

## 2017-03-10 DIAGNOSIS — M940 Chondrocostal junction syndrome [Tietze]: Secondary | ICD-10-CM

## 2017-03-10 DIAGNOSIS — R590 Localized enlarged lymph nodes: Secondary | ICD-10-CM | POA: Diagnosis not present

## 2017-03-10 DIAGNOSIS — J329 Chronic sinusitis, unspecified: Secondary | ICD-10-CM | POA: Diagnosis not present

## 2017-03-10 MED ORDER — HYDROCODONE-HOMATROPINE 5-1.5 MG/5ML PO SYRP: 5.0000 mL | ORAL_SOLUTION | Freq: Every evening | ORAL | 0 refills | Status: DC | PRN

## 2017-03-10 MED ORDER — PREDNISONE 50 MG PO TABS: ORAL_TABLET | ORAL | 0 refills | Status: DC

## 2017-03-10 MED ORDER — AZITHROMYCIN 250 MG PO TABS: ORAL_TABLET | ORAL | 0 refills | Status: DC

## 2017-03-10 NOTE — Progress Notes (Signed)
Call pt: WBC perfectly normal. CBC looks great.

## 2017-03-10 NOTE — Patient Instructions (Addendum)
Costochondritis Costochondritis is swelling and irritation (inflammation) of the tissue (cartilage) that connects your ribs to your breastbone (sternum). This causes pain in the front of your chest. Usually, the pain:  Starts gradually.  Is in more than one rib.  This condition usually goes away on its own over time. Follow these instructions at home:  Do not do anything that makes your pain worse.  If directed, put ice on the painful area: ? Put ice in a plastic bag. ? Place a towel between your skin and the bag. ? Leave the ice on for 20 minutes, 2-3 times a day.  If directed, put heat on the affected area as often as told by your doctor. Use the heat source that your doctor tells you to use, such as a moist heat pack or a heating pad. ? Place a towel between your skin and the heat source. ? Leave the heat on for 20-30 minutes. ? Take off the heat if your skin turns bright red. This is very important if you cannot feel pain, heat, or cold. You may have a greater risk of getting burned.  Take over-the-counter and prescription medicines only as told by your doctor.  Return to your normal activities as told by your doctor. Ask your doctor what activities are safe for you.  Keep all follow-up visits as told by your doctor. This is important. Contact a doctor if:  You have chills or a fever.  Your pain does not go away or it gets worse.  You have a cough that does not go away. Get help right away if:  You are short of breath. This information is not intended to replace advice given to you by your health care provider. Make sure you discuss any questions you have with your health care provider. Document Released: 09/18/2007 Document Revised: 10/20/2015 Document Reviewed: 07/26/2015 Elsevier Interactive Patient Education  2018 Elsevier Inc. Upper Respiratory Infection, Adult Most upper respiratory infections (URIs) are caused by a virus. A URI affects the nose, throat, and upper  air passages. The most common type of URI is often called "the common cold." Follow these instructions at home:  Take medicines only as told by your doctor.  Gargle warm saltwater or take cough drops to comfort your throat as told by your doctor.  Use a warm mist humidifier or inhale steam from a shower to increase air moisture. This may make it easier to breathe.  Drink enough fluid to keep your pee (urine) clear or pale yellow.  Eat soups and other clear broths.  Have a healthy diet.  Rest as needed.  Go back to work when your fever is gone or your doctor says it is okay. ? You may need to stay home longer to avoid giving your URI to others. ? You can also wear a face mask and wash your hands often to prevent spread of the virus.  Use your inhaler more if you have asthma.  Do not use any tobacco products, including cigarettes, chewing tobacco, or electronic cigarettes. If you need help quitting, ask your doctor. Contact a doctor if:  You are getting worse, not better.  Your symptoms are not helped by medicine.  You have chills.  You are getting more short of breath.  You have brown or red mucus.  You have yellow or brown discharge from your nose.  You have pain in your face, especially when you bend forward.  You have a fever.  You have puffy (swollen) neck  glands.  You have pain while swallowing.  You have white areas in the back of your throat. Get help right away if:  You have very bad or constant: ? Headache. ? Ear pain. ? Pain in your forehead, behind your eyes, and over your cheekbones (sinus pain). ? Chest pain.  You have long-lasting (chronic) lung disease and any of the following: ? Wheezing. ? Long-lasting cough. ? Coughing up blood. ? A change in your usual mucus.  You have a stiff neck.  You have changes in your: ? Vision. ? Hearing. ? Thinking. ? Mood. This information is not intended to replace advice given to you by your health care  provider. Make sure you discuss any questions you have with your health care provider. Document Released: 09/18/2007 Document Revised: 12/03/2015 Document Reviewed: 07/07/2013 Elsevier Interactive Patient Education  2018 [employer].

## 2017-03-10 NOTE — Progress Notes (Addendum)
Subjective:    Patient ID: Deanna Keller, female    DOB: [birth year], 36 y.o.   MRN: [id number]  HPI Deanna Keller is a 36 y/o female who presents with sore throat, fatigue cough and congestion. She states that the symptoms began about 2 weeks ago as just a sore throat, cough, postnasal drip and fatigue. She started getting better after about 1 week and then got worse and developed chest congestion, productive cough, and "clogged ears". Her son was sick last week and her husband is now sick as well. She also reports headache, fatigue, rhinorrhea and feeling feverish, however has not taken her temperature. She denies chills, nausea, vomiting, and body aches. Her chest hurts moderately with any deep breathing and cough.   .. Active Ambulatory Problems    Diagnosis Date Noted  . History of alcoholism (HCC) 07/21/2013  . B12 deficiency 07/21/2013  . Iron deficiency 07/21/2013  . Depression 11/10/2013  . Elevated LFTs 11/10/2013  . Chronic migraine 11/10/2013  . Malabsorption 11/10/2013  . S/P gastric bypass 11/11/2013  . Vitamin D deficiency 11/11/2013  . Recovering alcoholic in remission (HCC) 06/16/2014  . Lumbar degenerative disc disease 06/20/2014  . Bariatric surgery status in pregnancy 02/10/2013  . Phlebectasia 10/21/2011  . Hypoglycemia following gastrointestinal surgery 04/12/2013  . Generalized seizure (HCC) 08/30/2014  . Left lumbar radiculitis 10/11/2014  . IUD check up 11/24/2014  . Postoperative blind loop syndrome 04/12/2013  . Viral URI 12/20/2014  . Generalized anxiety disorder 09/06/2015  . Benzodiazepine misuse (HCC) 09/06/2015  . Acute bronchitis 12/26/2015   Resolved Ambulatory Problems    Diagnosis Date Noted  . No Resolved Ambulatory Problems   Past Medical History:  Diagnosis Date  . Alcohol abuse   . Anxiety       Review of Systems  Constitutional: Positive for fatigue and fever (feels feverish but has not taken temperature). Negative for chills.   HENT: Positive for congestion, postnasal drip, rhinorrhea and sore throat. Negative for sinus pressure and sinus pain.   Respiratory: Positive for cough and wheezing.   Gastrointestinal: Negative for nausea and vomiting.  Musculoskeletal: Negative for myalgias.  Neurological: Positive for headaches.      Objective:   Physical Exam  Constitutional: She is oriented to person, place, and time. She appears well-developed and well-nourished.  HENT:  Head: Normocephalic and atraumatic.  Right Ear: Tympanic membrane normal. No drainage, swelling or tenderness. Tympanic membrane is not erythematous.  Left Ear: Tympanic membrane normal. No drainage, swelling or tenderness. Tympanic membrane is not erythematous.  Mouth/Throat: Uvula is midline. Mucous membranes are not pale and not dry. Posterior oropharyngeal erythema present. No oropharyngeal exudate or posterior oropharyngeal edema.  Cardiovascular: Normal rate and regular rhythm.  Pulmonary/Chest: Effort normal and breath sounds normal. She has no wheezes. She has no rales.  Lymphadenopathy:    She has cervical adenopathy (enlarged posterior cervical node on the right).  Neurological: She is alert and oriented to person, place, and time.  Skin: Skin is warm and dry.  Psychiatric: She has a normal mood and affect. Her behavior is normal. Thought content normal.  Vitals reviewed.     Assessment & Plan:  Deanna Keller was seen today for uri.  Diagnoses and all orders for this visit:  Sinobronchitis -     CBC with Differential/Platelet -     Epstein-Barr virus VCA antibody panel -     azithromycin (ZITHROMAX) 250 MG tablet; Take 2 tablets now and then one tablet for 4 days. -  HYDROcodone-homatropine (HYCODAN) 5-1.5 MG/5ML syrup; Take 5 mLs by mouth at bedtime as needed.  Posterior cervical lymphadenopathy -     CBC with Differential/Platelet -     Epstein-Barr virus VCA antibody panel  Costochondritis -     predniSONE (DELTASONE) 50 MG  tablet; One tab PO daily for 5 days.   Reassured patient I do not think she has the flu due to the duration of illness. Cannot rule out mono due to fatigue. Will test in blood work. Started zpak and prednisone. HO given for costochondritis. Continue symptomatic care. Follow up if not improving.

## 2017-03-11 ENCOUNTER — Telehealth: Payer: Self-pay | Admitting: Physician Assistant

## 2017-03-11 LAB — CBC WITH DIFFERENTIAL/PLATELET
Basophils Absolute: 21 cells/uL (ref 0–200)
Basophils Relative: 0.4 %
Eosinophils Absolute: 140 cells/uL (ref 15–500)
Eosinophils Relative: 2.7 %
HCT: 43.8 % (ref 35.0–45.0)
Hemoglobin: 15 g/dL (ref 11.7–15.5)
Lymphs Abs: 1352 cells/uL (ref 850–3900)
MCH: 31.4 pg (ref 27.0–33.0)
MCHC: 34.2 g/dL (ref 32.0–36.0)
MCV: 91.6 fL (ref 80.0–100.0)
MPV: 9.2 fL (ref 7.5–12.5)
Monocytes Relative: 10.6 %
Neutro Abs: 3136 cells/uL (ref 1500–7800)
Neutrophils Relative %: 60.3 %
Platelets: 274 10*3/uL (ref 140–400)
RBC: 4.78 10*6/uL (ref 3.80–5.10)
RDW: 12.1 % (ref 11.0–15.0)
Total Lymphocyte: 26 %
WBC mixed population: 551 cells/uL (ref 200–950)
WBC: 5.2 10*3/uL (ref 3.8–10.8)

## 2017-03-11 LAB — EPSTEIN-BARR VIRUS VCA ANTIBODY PANEL
EBV NA IgG: 345 U/mL — ABNORMAL HIGH
EBV VCA IgG: 610 U/mL — ABNORMAL HIGH
EBV VCA IgM: <36 U/mL

## 2017-03-11 NOTE — Progress Notes (Signed)
Call pt: you have had mono in the past but NO recent exposure. How are you feeling today.

## 2017-03-11 NOTE — Telephone Encounter (Signed)
Pt called about lab result and still not feeling good.---notified pt lab result--normal and keep drinking clear fluid with antibiotic...please call us back in 24 hour

## 2017-03-13 ENCOUNTER — Telehealth: Payer: Self-pay | Admitting: *Deleted

## 2017-03-13 ENCOUNTER — Other Ambulatory Visit: Payer: Self-pay | Admitting: *Deleted

## 2017-03-13 MED ORDER — BENZONATATE 200 MG PO CAPS: 200.0000 mg | ORAL_CAPSULE | Freq: Three times a day (TID) | ORAL | 0 refills | Status: DC | PRN

## 2017-03-13 MED ORDER — LEVOFLOXACIN 500 MG PO TABS: 500.0000 mg | ORAL_TABLET | Freq: Every day | ORAL | 0 refills | Status: DC

## 2017-03-13 NOTE — Telephone Encounter (Signed)
We can switch to levaquin and stop zpak. This still could be viral and needing to run its course with symptomatic care.  Ok to send levaquin 500mg  one tablet for 7 days #7 NRF make sure to start the prednisone.

## 2017-03-13 NOTE — Progress Notes (Signed)
Pt notified of new rx.  She asked for a refill of the cough syrup, advised that we are unable to refill that so soon due to it being a controlled substance.  [facility] agreed to [facility].

## 2017-03-13 NOTE — Telephone Encounter (Signed)
Pt called today wanting to let you know that she is not getting any better after being on the abx.  She wants to know if she possibly needs a different one or something. Please advise.

## 2017-03-15 ENCOUNTER — Other Ambulatory Visit (HOSPITAL_COMMUNITY): Payer: Self-pay | Admitting: Psychiatry

## 2017-03-19 ENCOUNTER — Telehealth: Payer: Self-pay | Admitting: *Deleted

## 2017-03-19 ENCOUNTER — Other Ambulatory Visit: Payer: Self-pay | Admitting: Physician Assistant

## 2017-03-19 DIAGNOSIS — J329 Chronic sinusitis, unspecified: Secondary | ICD-10-CM

## 2017-03-19 DIAGNOSIS — J40 Bronchitis, not specified as acute or chronic: Principal | ICD-10-CM

## 2017-03-19 DIAGNOSIS — M5441 Lumbago with sciatica, right side: Secondary | ICD-10-CM

## 2017-03-19 DIAGNOSIS — M5442 Lumbago with sciatica, left side: Principal | ICD-10-CM

## 2017-03-19 MED ORDER — HYDROCODONE-HOMATROPINE 5-1.5 MG/5ML PO SYRP: 5.0000 mL | ORAL_SOLUTION | Freq: Four times a day (QID) | ORAL | 0 refills | Status: DC | PRN

## 2017-03-19 NOTE — Telephone Encounter (Signed)
You have completed both zpak and levaquin. There is no indication for a third antibiotic unless we see a bacterial cause. Likely your residual symptoms are viral in nature. I will refill cough syrup. If you are coughing, SOB, trouble breathing then we could order CXR. If you are congested continue flonase or atrovent. You should be feeling better soon.

## 2017-03-19 NOTE — Telephone Encounter (Signed)
Pt advised of PCP's recommendation, she will get the cough syrup. She denied chest xray at this time. She will let us know if her symptoms do not get better. No further questions.

## 2017-03-19 NOTE — Telephone Encounter (Signed)
Last filled 11/26 of hycodan.

## 2017-03-19 NOTE — Telephone Encounter (Signed)
Pt left vm wanting to know if she could have a refill of Hycodan since her AND her husband have been sick.  She also stated that she's still not feeling all the way better and wants a refill of the abx as well.  Please advise.

## 2017-03-26 ENCOUNTER — Telehealth (HOSPITAL_COMMUNITY): Payer: Self-pay

## 2017-03-26 MED ORDER — HYDROXYZINE HCL 25 MG PO TABS: 25.0000 mg | ORAL_TABLET | Freq: Every day | ORAL | 0 refills | Status: DC | PRN

## 2017-03-26 MED ORDER — ESCITALOPRAM OXALATE 10 MG PO TABS: 10.0000 mg | ORAL_TABLET | Freq: Every day | ORAL | 0 refills | Status: DC

## 2017-03-26 NOTE — Telephone Encounter (Signed)
[facility]ills on Hydroxyzine 25mg  and Escitalopram 10mg . Sent over one month supply of medication to pharmacy. Patients' next schedule appointment is on 01/291/19. Nothing further is needed at this time.

## 2017-03-31 ENCOUNTER — Other Ambulatory Visit (HOSPITAL_COMMUNITY): Payer: Self-pay

## 2017-03-31 ENCOUNTER — Telehealth (HOSPITAL_COMMUNITY): Payer: Self-pay | Admitting: Psychiatry

## 2017-03-31 MED ORDER — ESCITALOPRAM OXALATE 10 MG PO TABS: 10.0000 mg | ORAL_TABLET | Freq: Every day | ORAL | 0 refills | Status: DC

## 2017-03-31 NOTE — Progress Notes (Signed)
Pharmacy sent over fax requesting a 90 day supply because insurance will not pay for a 30 day supply for Escitalpram 10mg . Sent over a 90 day supply to pharmacy. Nothing further is needed at this time.

## 2017-03-31 NOTE — Telephone Encounter (Signed)
Spoke with pharmacy and they confirmed that they have the Lexapro. Notified patient. Nothing further is needed at this time.

## 2017-03-31 NOTE — Telephone Encounter (Signed)
[facility] [address] [facility] for [facility]ro.   Please call patient back at [phone]

## 2017-04-23 ENCOUNTER — Other Ambulatory Visit: Payer: Self-pay | Admitting: Physician Assistant

## 2017-04-23 DIAGNOSIS — J40 Bronchitis, not specified as acute or chronic: Principal | ICD-10-CM

## 2017-04-23 DIAGNOSIS — J329 Chronic sinusitis, unspecified: Secondary | ICD-10-CM

## 2017-04-28 NOTE — Telephone Encounter (Signed)
This has been weeks ago. It is a controlled substance and cannot give multiple refills on it. If cough remains need to do some lung function testing.

## 2017-05-02 NOTE — Telephone Encounter (Signed)
Can we confirm that patient does want a refill of hycodan? I am concerned that cough is still present? Has it improved at all?

## 2017-05-10 ENCOUNTER — Encounter: Payer: Self-pay | Admitting: Physician Assistant

## 2017-05-13 ENCOUNTER — Encounter (HOSPITAL_COMMUNITY): Payer: Self-pay | Admitting: Psychiatry

## 2017-05-13 ENCOUNTER — Ambulatory Visit (INDEPENDENT_AMBULATORY_CARE_PROVIDER_SITE_OTHER): Payer: BLUE CROSS/BLUE SHIELD | Admitting: Psychiatry

## 2017-05-13 ENCOUNTER — Other Ambulatory Visit: Payer: Self-pay

## 2017-05-13 VITALS — BP 132/72 | HR 88 | Ht 65.0 in | Wt 209.0 lb

## 2017-05-13 DIAGNOSIS — F411 Generalized anxiety disorder: Secondary | ICD-10-CM | POA: Diagnosis not present

## 2017-05-13 DIAGNOSIS — G4700 Insomnia, unspecified: Secondary | ICD-10-CM

## 2017-05-13 DIAGNOSIS — F5102 Adjustment insomnia: Secondary | ICD-10-CM

## 2017-05-13 DIAGNOSIS — Z87891 Personal history of nicotine dependence: Secondary | ICD-10-CM | POA: Diagnosis not present

## 2017-05-13 DIAGNOSIS — F0630 Mood disorder due to known physiological condition, unspecified: Secondary | ICD-10-CM

## 2017-05-13 DIAGNOSIS — F1021 Alcohol dependence, in remission: Secondary | ICD-10-CM | POA: Diagnosis not present

## 2017-05-13 DIAGNOSIS — Z818 Family history of other mental and behavioral disorders: Secondary | ICD-10-CM

## 2017-05-13 MED ORDER — LAMOTRIGINE 100 MG PO TABS: ORAL_TABLET | ORAL | 0 refills | Status: DC

## 2017-05-13 MED ORDER — ESCITALOPRAM OXALATE 10 MG PO TABS: 15.0000 mg | ORAL_TABLET | Freq: Every day | ORAL | 0 refills | Status: DC

## 2017-05-13 MED ORDER — HYDROXYZINE HCL 25 MG PO TABS: 25.0000 mg | ORAL_TABLET | Freq: Every day | ORAL | 0 refills | Status: DC | PRN

## 2017-05-13 NOTE — Progress Notes (Signed)
[facility] Outpatient Follow up visit   Patient Identification: Deanna Keller MRN:  [id number] Date of Evaluation:  05/13/2017 Referral Source: Luvenia Starch , Dr. Madilyn Fireman Chief Complaint:   Chief Complaint    Follow-up; Other     Visit Diagnosis:    ICD-10-CM   1. Mood disorder in conditions classified elsewhere F06.30   2. Generalized anxiety disorder F41.1   3. Alcohol use disorder, moderate, in sustained remission (HCC) F10.21   4. Adjustment insomnia F51.02   5. Panic disorder  History of Present Illness:  37 years old currently married Caucasian female initially referred by primary care physician for management of anxiety also she must rule out bipolar or have second opinion  Not on abilify. Doing fair. 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 Wants to increase lexapro. Mood balanced    Sleep is fair if she takes hydroxyzine when necessary only and mostly at night does help the anxiety.  She works as a Mudlogger in Audiological scientist  Has had gastric bypass 13 years ago. Also she is in sustained remission no alcohol use   Aggravating factor:gastric bypass, finanes Modifying factor: husband, kids  Anxiety increased     Past Psychiatric History: depression, anxiety, panic symptoms   Previous Psychotropic Medications: Yes   Substance Abuse History in the last 12 months:  No.  Consequences of Substance Abuse: NA  Past Medical History:  Past Medical History:  Diagnosis Date  . Alcohol abuse    sober since 2011  . Anxiety     Past Surgical History:  Procedure Laterality Date  . CERVICAL CONE BIOPSY    . GASTRIC BYPASS  2005      Family History:  Family History  Problem Relation Age of Onset  . Colon cancer Mother   . Depression Mother   . Hypertension Father     Social History:   Social History   Socioeconomic History  . Marital status: Married    Spouse name: None  . Number of children: None  . Years of education: None  . Highest  education level: None  Social Needs  . Financial resource strain: None  . Food insecurity - worry: None  . Food insecurity - inability: None  . Transportation needs - medical: None  . Transportation needs - non-medical: None  Occupational History  . None  Tobacco Use  . Smoking status: Former Smoker    Packs/day: 0.50    Years: 10.00    Pack years: 5.00    Types: Cigarettes    Start date: 07/21/2012  . Smokeless tobacco: Never Used  Substance and Sexual Activity  . Alcohol use: No  . Drug use: No  . Sexual activity: Yes    Partners: Male  Other Topics Concern  . None  Social History Narrative  . None       Allergies:   Allergies  Allergen Reactions  . Tramadol Other (See Comments)    Possible seizure  . Augmentin [Amoxicillin-Pot Clavulanate]     vomiting    Metabolic Disorder Labs: No results found for: HGBA1C, MPG No results found for: PROLACTIN Lab Results  Component Value Date   CHOL 158 05/15/2011   TRIG 50 05/15/2011   HDL 76 (A) 05/15/2011   LDLCALC 72 05/15/2011     Current Medications: Current Outpatient Medications  Medication Sig Dispense Refill  . Cyanocobalamin 1000 MCG/ML KIT Inject 1 mL every two weeks. 20 kit 2  . cyclobenzaprine (FLEXERIL) 10 MG tablet TAKE 1  TABLET (10 MG TOTAL) AT BEDTIME BY MOUTH. 30 tablet 2  . diclofenac (VOLTAREN) 75 MG EC tablet Take 1 tablet (75 mg total) 2 (two) times daily by mouth. 60 tablet 0  . escitalopram (LEXAPRO) 10 MG tablet Take 1.5 tablets (15 mg total) by mouth daily. 135 tablet 0  . HYDROcodone-homatropine (HYCODAN) 5-1.5 MG/5ML syrup Take 5 mLs by mouth every 6 (six) hours as needed. 100 mL 0  . hydrOXYzine (ATARAX/VISTARIL) 25 MG tablet Take 1 tablet (25 mg total) by mouth daily as needed. 30 tablet 0  . lamoTRIgine (LAMICTAL) 100 MG tablet 3x times daily 90 tablet 0  . benzonatate (TESSALON) 200 MG capsule Take 1 capsule (200 mg total) by mouth 3 (three) times daily as needed for cough. (Patient not  taking: Reported on 05/13/2017) 20 capsule 0  . levofloxacin (LEVAQUIN) 500 MG tablet Take 1 tablet (500 mg total) by mouth daily. (Patient not taking: Reported on 05/13/2017) 7 tablet 0  . predniSONE (DELTASONE) 50 MG tablet One tab PO daily for 5 days. (Patient not taking: Reported on 05/13/2017) 5 tablet 0   No current facility-administered medications for this visit.       Psychiatric Specialty Exam: Review of Systems  Cardiovascular: Negative for chest pain.  Skin: Negative for rash.  Neurological: Negative for tremors.  Psychiatric/Behavioral: Negative for depression.    Blood pressure 132/72, pulse 88, height 5' 5"  (1.651 m), weight 209 lb (94.8 kg).Body mass index is 34.78 kg/m.  General Appearance: Casual  Eye Contact:  Fair  Speech:  Normal Rate  Volume:  Decreased  Mood: fair  Affect:  pleasant  Thought Process:  Goal Directed  Orientation:  Full (Time, Place, and Person)  Thought Content:  Rumination  Suicidal Thoughts:  No  Homicidal Thoughts:  No  Memory:  Immediate;   Fair Recent;   Fair  Judgement:  Fair  Insight:  Fair  Psychomotor Activity:  Decreased  Concentration:  Concentration: Fair and Attention Span: Fair  Recall:  [employer] of Knowledge:Fair  Language: Fair  Akathisia:  Negative  Handed:  Right  AIMS (if indicated):    Assets:  Desire for Improvement  ADL's:  Intact  Cognition: WNL  Sleep:  variable    Treatment Plan Summary: Medication management and Plan as follows  1. Mood disorder NOS;no clear mania. Continue lamictal for now can cut down once stable more 2. Panic attacks/GAD:ongoing related to license . increae lexapro to 26m 3. Insomnia: fair.   4. Alcohol use: sober 8 years. Relapse prevention discussed Fu 3 months.renewed meds    NMerian Capron MD 1/29/20198:47 AM

## 2017-05-30 ENCOUNTER — Encounter: Payer: Self-pay | Admitting: Physician Assistant

## 2017-06-01 ENCOUNTER — Other Ambulatory Visit: Payer: Self-pay | Admitting: Physician Assistant

## 2017-06-01 DIAGNOSIS — M5441 Lumbago with sciatica, right side: Secondary | ICD-10-CM

## 2017-06-01 DIAGNOSIS — M5442 Lumbago with sciatica, left side: Principal | ICD-10-CM

## 2017-06-05 ENCOUNTER — Encounter: Payer: Self-pay | Admitting: Physician Assistant

## 2017-06-09 ENCOUNTER — Other Ambulatory Visit: Payer: Self-pay | Admitting: Physician Assistant

## 2017-06-09 MED ORDER — ALPRAZOLAM 0.5 MG PO TABS: 0.5000 mg | ORAL_TABLET | Freq: Two times a day (BID) | ORAL | 0 refills | Status: DC | PRN

## 2017-06-16 ENCOUNTER — Ambulatory Visit (INDEPENDENT_AMBULATORY_CARE_PROVIDER_SITE_OTHER): Payer: BLUE CROSS/BLUE SHIELD | Admitting: Physician Assistant

## 2017-06-16 ENCOUNTER — Encounter: Payer: Self-pay | Admitting: Physician Assistant

## 2017-06-16 VITALS — BP 129/67 | HR 96 | Ht 65.0 in | Wt 211.0 lb

## 2017-06-16 DIAGNOSIS — F411 Generalized anxiety disorder: Secondary | ICD-10-CM | POA: Diagnosis not present

## 2017-06-16 DIAGNOSIS — F331 Major depressive disorder, recurrent, moderate: Secondary | ICD-10-CM | POA: Diagnosis not present

## 2017-06-16 DIAGNOSIS — F430 Acute stress reaction: Secondary | ICD-10-CM | POA: Diagnosis not present

## 2017-06-16 MED ORDER — ESCITALOPRAM OXALATE 20 MG PO TABS: 20.0000 mg | ORAL_TABLET | Freq: Every day | ORAL | 0 refills | Status: DC

## 2017-06-16 MED ORDER — ALPRAZOLAM 0.5 MG PO TABS: 0.5000 mg | ORAL_TABLET | Freq: Two times a day (BID) | ORAL | 2 refills | Status: DC | PRN

## 2017-06-16 NOTE — Patient Instructions (Signed)
Increased lexapro and xanax as needed.

## 2017-06-16 NOTE — Progress Notes (Signed)
Subjective:    Patient ID: Deanna Keller, female    DOB: 08/30/1980, 37 y.o.   MRN: [id number]  HPI  Patient is a 37 year old female with history of anxiety and depression who presents to the clinic having a hard time managing her anxiety and stress.  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  She continues to work full-time.  She has classes 2 times a week for 4 hours.  She is studying at least 2 hours a night.  She states that this is "the hardest thing she has ever had to do".  She had no idea it contains a much math and she is scared of math.  Her exam is at the end of April.  She is use Xanax in the past for a few months and then came off of it.  She continues on 15 mg of Lexapro and Lamictal.  She admits she had a panic attack before class the other day.  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  .. Active Ambulatory Problems    Diagnosis Date Noted  . History of alcoholism (HCC) 07/21/2013  . B12 deficiency 07/21/2013  . Iron deficiency 07/21/2013  . Depression 11/10/2013  . Elevated LFTs 11/10/2013  . Chronic migraine 11/10/2013  . Malabsorption 11/10/2013  . S/P gastric bypass 11/11/2013  . Vitamin D deficiency 11/11/2013  . Recovering alcoholic in remission (HCC) 06/16/2014  . Lumbar degenerative disc disease 06/20/2014  . Bariatric surgery status in pregnancy 02/10/2013  . Phlebectasia 10/21/2011  . Hypoglycemia following gastrointestinal surgery 04/12/2013  . Generalized seizure (HCC) 08/30/2014  . Left lumbar radiculitis 10/11/2014  . IUD check up 11/24/2014  . Postoperative blind loop syndrome 04/12/2013  . Viral URI 12/20/2014  . Generalized anxiety disorder 09/06/2015  . Benzodiazepine misuse (HCC) 09/06/2015  . Acute bronchitis 12/26/2015   Resolved Ambulatory Problems    Diagnosis Date Noted  . No Resolved Ambulatory Problems   Past Medical History:  Diagnosis Date  . Alcohol abuse    . Anxiety       Review of Systems  All other systems reviewed and are negative.      Objective:   Physical Exam  Constitutional: She is oriented to person, place, and time. She appears well-developed and well-nourished.  HENT:  Head: Normocephalic and atraumatic.  Cardiovascular: Normal rate, regular rhythm and normal heart sounds.  Pulmonary/Chest: Effort normal and breath sounds normal.  Neurological: She is alert and oriented to person, place, and time.  Psychiatric: Her behavior is normal.  Very stressed.           Assessment & Plan:  Marland [name] KitchenIleane was seen today for anxiety.  Diagnoses and all orders for this visit:  Stress reaction -     escitalopram (LEXAPRO) 20 MG tablet; Take 1 tablet (20 mg total) by mouth daily. -     ALPRAZolam (XANAX) 0.5 MG tablet; Take 1 tablet (0.5 mg total) by mouth 2 (two) times daily as needed for anxiety.  Moderate episode of recurrent major depressive disorder (HCC) -     escitalopram (LEXAPRO) 20 MG tablet; Take 1 tablet (20 mg total) by mouth daily.  Generalized anxiety disorder -     escitalopram (LEXAPRO) 20 MG tablet; Take 1 tablet (20 mg total) by mouth daily. -     ALPRAZolam (XANAX) 0.5 MG tablet; Take 1 tablet (0.5 mg total) by mouth 2 (two) times daily  as needed for anxiety.   .. Depression screen PHQ 2/9 06/16/2017  Decreased Interest 1  Down, Depressed, Hopeless 1  PHQ - 2 Score 2  Altered sleeping 2  Tired, decreased energy 2  Change in appetite 3  Feeling bad or failure about yourself  1  Trouble concentrating 2  Moving slowly or fidgety/restless 0  Suicidal thoughts 0  PHQ-9 Score 12  Difficult doing work/chores Very difficult  Some encounter information is confidential and restricted. Go to Review Flowsheets activity to see all data.   .. GAD 7 : Generalized Anxiety Score 06/16/2017  Nervous, Anxious, on Edge 3  Control/stop worrying 3  Worry too much - different things 3  Trouble relaxing 3  Restless 2   Easily annoyed or irritable 1  Afraid - awful might happen 1  Total GAD 7 Score 16  Some encounter information is confidential and restricted. Go to Review Flowsheets activity to see all data.    I increased lexapro to 20mg  daily. I did go ahead and give her up to twice a day xanax for as needed usage. Discussed this is short term supply. Patient aware of abuse potential. Spent time discussing stress relief options and coping. Encouraged exercise.  Follow up in 3 months.

## 2017-06-20 ENCOUNTER — Ambulatory Visit: Payer: Self-pay | Admitting: Physician Assistant

## 2017-07-07 ENCOUNTER — Telehealth (HOSPITAL_COMMUNITY): Payer: Self-pay

## 2017-07-07 ENCOUNTER — Other Ambulatory Visit (HOSPITAL_COMMUNITY): Payer: Self-pay

## 2017-07-07 MED ORDER — LAMOTRIGINE 100 MG PO TABS: ORAL_TABLET | ORAL | 0 refills | Status: DC

## 2017-07-07 NOTE — Telephone Encounter (Signed)
Pharmacy sent over a fax requesting refill on Lamotrigine 100 mg. Sent over a one month supply per Dr. Gilmore LarocheAkhtar. Patient needs to make a follow up appointment before she receives any more refills. Nothing further needed at this time.

## 2017-07-10 ENCOUNTER — Encounter: Payer: Self-pay | Admitting: Physician Assistant

## 2017-07-10 DIAGNOSIS — Z79899 Other long term (current) drug therapy: Secondary | ICD-10-CM

## 2017-07-11 ENCOUNTER — Other Ambulatory Visit: Payer: Self-pay | Admitting: Physician Assistant

## 2017-07-11 ENCOUNTER — Other Ambulatory Visit (HOSPITAL_COMMUNITY): Payer: Self-pay

## 2017-07-11 MED ORDER — LAMOTRIGINE 100 MG PO TABS: ORAL_TABLET | ORAL | 0 refills | Status: DC

## 2017-07-11 NOTE — Progress Notes (Signed)
[facility] pharmacy sent over a 90 day supply request for Lamotrigine 100mg . I changed the refill Dr. Gilmore LarocheAkhtar sent in on 07/07/17 to a 90 day supply. Nothing further is needed at this time.

## 2017-07-23 ENCOUNTER — Other Ambulatory Visit (HOSPITAL_COMMUNITY): Payer: Self-pay

## 2017-07-23 MED ORDER — HYDROXYZINE HCL 25 MG PO TABS: 25.0000 mg | ORAL_TABLET | Freq: Every day | ORAL | 0 refills | Status: DC | PRN

## 2017-08-05 ENCOUNTER — Ambulatory Visit (HOSPITAL_COMMUNITY): Payer: Self-pay | Admitting: Psychiatry

## 2017-08-05 ENCOUNTER — Other Ambulatory Visit: Payer: Self-pay | Admitting: Physician Assistant

## 2017-08-05 DIAGNOSIS — M5442 Lumbago with sciatica, left side: Principal | ICD-10-CM

## 2017-08-05 DIAGNOSIS — M5441 Lumbago with sciatica, right side: Secondary | ICD-10-CM

## 2017-08-14 ENCOUNTER — Other Ambulatory Visit (HOSPITAL_COMMUNITY): Payer: Self-pay | Admitting: Psychiatry

## 2017-08-25 ENCOUNTER — Encounter: Payer: Self-pay | Admitting: Physician Assistant

## 2017-08-25 DIAGNOSIS — F411 Generalized anxiety disorder: Secondary | ICD-10-CM

## 2017-08-25 DIAGNOSIS — F430 Acute stress reaction: Secondary | ICD-10-CM

## 2017-08-26 ENCOUNTER — Other Ambulatory Visit (HOSPITAL_COMMUNITY): Payer: Self-pay | Admitting: Psychiatry

## 2017-08-26 MED ORDER — ALPRAZOLAM 0.5 MG PO TABS: 0.5000 mg | ORAL_TABLET | Freq: Two times a day (BID) | ORAL | 1 refills | Status: DC | PRN

## 2017-08-30 ENCOUNTER — Other Ambulatory Visit (HOSPITAL_COMMUNITY): Payer: Self-pay | Admitting: Psychiatry

## 2017-09-02 ENCOUNTER — Other Ambulatory Visit (HOSPITAL_COMMUNITY): Payer: Self-pay | Admitting: Psychiatry

## 2017-09-03 ENCOUNTER — Encounter: Payer: Self-pay | Admitting: Physician Assistant

## 2017-09-03 DIAGNOSIS — F430 Acute stress reaction: Secondary | ICD-10-CM

## 2017-09-03 DIAGNOSIS — F411 Generalized anxiety disorder: Secondary | ICD-10-CM

## 2017-09-03 DIAGNOSIS — F331 Major depressive disorder, recurrent, moderate: Secondary | ICD-10-CM

## 2017-09-03 MED ORDER — ESCITALOPRAM OXALATE 20 MG PO TABS
20.0000 mg | ORAL_TABLET | Freq: Every day | ORAL | 0 refills | Status: DC
Start: 2017-09-03 — End: 2018-06-02

## 2017-09-03 MED ORDER — HYDROXYZINE HCL 25 MG PO TABS: 25.0000 mg | ORAL_TABLET | Freq: Every day | ORAL | 0 refills | Status: DC | PRN

## 2017-09-03 MED ORDER — LAMOTRIGINE 100 MG PO TABS: ORAL_TABLET | ORAL | 0 refills | Status: DC

## 2017-09-18 ENCOUNTER — Encounter: Payer: Self-pay | Admitting: Physician Assistant

## 2017-10-01 ENCOUNTER — Encounter: Payer: Self-pay | Admitting: Physician Assistant

## 2017-10-01 ENCOUNTER — Ambulatory Visit (INDEPENDENT_AMBULATORY_CARE_PROVIDER_SITE_OTHER): Payer: BLUE CROSS/BLUE SHIELD | Admitting: Physician Assistant

## 2017-10-01 VITALS — BP 118/68 | HR 111 | Ht 65.0 in | Wt 210.0 lb

## 2017-10-01 DIAGNOSIS — E6609 Other obesity due to excess calories: Secondary | ICD-10-CM

## 2017-10-01 DIAGNOSIS — R635 Abnormal weight gain: Secondary | ICD-10-CM | POA: Diagnosis not present

## 2017-10-01 DIAGNOSIS — Z6835 Body mass index (BMI) 35.0-35.9, adult: Secondary | ICD-10-CM | POA: Diagnosis not present

## 2017-10-01 DIAGNOSIS — F430 Acute stress reaction: Secondary | ICD-10-CM | POA: Diagnosis not present

## 2017-10-01 DIAGNOSIS — F411 Generalized anxiety disorder: Secondary | ICD-10-CM | POA: Diagnosis not present

## 2017-10-01 DIAGNOSIS — Z9884 Bariatric surgery status: Secondary | ICD-10-CM

## 2017-10-01 DIAGNOSIS — E538 Deficiency of other specified B group vitamins: Secondary | ICD-10-CM

## 2017-10-01 MED ORDER — CYANOCOBALAMIN 1000 MCG/ML IJ SOLN: 1000.0000 ug | INTRAMUSCULAR | 4 refills | Status: DC

## 2017-10-01 MED ORDER — BUPROPION HCL ER (SR) 100 MG PO TB12: 100.0000 mg | ORAL_TABLET | Freq: Two times a day (BID) | ORAL | 1 refills | Status: DC

## 2017-10-01 MED ORDER — ALPRAZOLAM 0.5 MG PO TABS: 0.5000 mg | ORAL_TABLET | Freq: Two times a day (BID) | ORAL | 1 refills | Status: DC | PRN

## 2017-10-01 MED ORDER — HYDROXYZINE HCL 25 MG PO TABS: 25.0000 mg | ORAL_TABLET | Freq: Every day | ORAL | 1 refills | Status: DC | PRN

## 2017-10-01 MED ORDER — NALTREXONE HCL 50 MG PO TABS: 25.0000 mg | ORAL_TABLET | Freq: Two times a day (BID) | ORAL | 1 refills | Status: DC

## 2017-10-01 NOTE — Patient Instructions (Signed)
Myofascial Pain Syndrome and Fibromyalgia Myofascial pain syndrome and fibromyalgia are both pain disorders. This pain may be felt mainly in your muscles.  Myofascial pain syndrome: ? Always has trigger points or tender points in the muscle that will cause pain when pressed. The pain may come and go. ? Usually affects your neck, upper back, and shoulder areas. The pain often radiates into your arms and hands.  Fibromyalgia: ? Has muscle pains and tenderness that come and go. ? Is often associated with fatigue and sleep disturbances. ? Has trigger points. ? Tends to be long-lasting (chronic), but is not life-threatening.  Fibromyalgia and myofascial pain are not the same. However, they often occur together. If you have both conditions, each can make the other worse. Both are common and can cause enough pain and fatigue to make day-to-day activities difficult. What are the causes? The exact causes of fibromyalgia and myofascial pain are not known. People with certain gene types may be more likely to develop fibromyalgia. Some factors can be triggers for both conditions, such as:  Spine disorders.  Arthritis.  Severe injury (trauma) and other physical stressors.  Being under a lot of stress.  A medical illness.  What are the signs or symptoms? Fibromyalgia The main symptom of fibromyalgia is widespread pain and tenderness in your muscles. This can vary over time. Pain is sometimes described as stabbing, shooting, or burning. You may have tingling or numbness, too. You may also have sleep problems and fatigue. You may wake up feeling tired and groggy (fibro fog). Other symptoms may include:  Bowel and bladder problems.  Headaches.  Visual problems.  Problems with odors and noises.  Depression or mood changes.  Painful menstrual periods (dysmenorrhea).  Dry skin or eyes.  Myofascial pain syndrome Symptoms of myofascial pain syndrome include:  Tight, ropy bands of  muscle.  Uncomfortable sensations in muscular areas, such as: ? Aching. ? Cramping. ? Burning. ? Numbness. ? Tingling. ? Muscle weakness.  Trouble moving certain muscles freely (range of motion).  How is this diagnosed? There are no specific tests to diagnose fibromyalgia or myofascial pain syndrome. Both can be hard to diagnose because their symptoms are common in many other conditions. Your health care provider may suspect one or both of these conditions based on your symptoms and medical history. Your health care provider will also do a physical exam. The key to diagnosing fibromyalgia is having pain, fatigue, and other symptoms for more than three months that cannot be explained by another condition. The key to diagnosing myofascial pain syndrome is finding trigger points in muscles that are tender and cause pain elsewhere in your body (referred pain). How is this treated? Treating fibromyalgia and myofascial pain often requires a team of health care providers. This usually starts with your primary provider and a physical therapist. You may also find it helpful to work with alternative health care providers, such as massage therapists or acupuncturists. Treatment for fibromyalgia may include medicines. This may include nonsteroidal anti-inflammatory drugs (NSAIDs), along with other medicines. Treatment for myofascial pain may also include:  NSAIDs.  Cooling and stretching of muscles.  Trigger point injections.  Sound wave (ultrasound) treatments to stimulate muscles.  Follow these instructions at home:  Take medicines only as directed by your health care provider.  Exercise as directed by your health care provider or physical therapist.  Try to avoid stressful situations.  Practice relaxation techniques to control your stress. You may want to try: ? Biofeedback. ? Visual   imagery. ? Hypnosis. ? Muscle relaxation. ? Yoga. ? Meditation.  Talk to your health care provider  about alternative treatments, such as acupuncture or massage treatment.  Maintain a healthy lifestyle. This includes eating a healthy diet and getting enough sleep.  Consider joining a support group.  Do not do activities that stress or strain your muscles. That includes repetitive motions and heavy lifting. Where to find more information:  National Fibromyalgia Association: www.fmaware.org  Arthritis Foundation: www.arthritis.org  American Chronic Pain Association: www.theacpa.org/condition/myofascial-pain Contact a health care provider if:  You have new symptoms.  Your symptoms get worse.  You have side effects from your medicines.  You have trouble sleeping.  Your condition is causing depression or anxiety. This information is not intended to replace advice given to you by your health care provider. Make sure you discuss any questions you have with your health care provider. Document Released: 04/01/2005 Document Revised: 09/07/2015 Document Reviewed: 01/05/2014 Elsevier Interactive Patient Education  2018 Elsevier Inc.  

## 2017-10-01 NOTE — Progress Notes (Signed)
Subjective:    Patient ID: Deanna Keller, female    DOB: May 15, 1980, 37 y.o.   MRN: [id number]  HPI  Pt is a 37 yo obese female s/p 13 years ago gastric bypass who comes in frustrated about weight, worsening depression and anxiety.   She has been back to her bariatric provider and they have done laboratory work up and started her on a 13 week weight loss and counseling program. She has a friend on contrave and is interested in starting that. She has gained 20lbs over the past 2 years but overall 50lbs since her post goal surgical weight. She has lost 10lbs in last 2 months. She is exercising through this program.   Her depression and anxiety is bad right now. She has been on lexapro for a few years and feels like it is not working. 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 Denies any SI/HC thoughts. Recently started back on xanax. She feels like this helps the best with her anxiety right now.    Pt aches all over. Some days worse than others. She does work out. She wonders about fibromyalgia.   Pt has on her history generalized seizure disorder but reports one seizure after a medication.    Review of Systems  All other systems reviewed and are negative.      Objective:   Physical Exam  Constitutional: She is oriented to person, place, and time. She appears well-developed and well-nourished.  HENT:  Head: Normocephalic and atraumatic.  Cardiovascular: Normal rate and regular rhythm.  Pulmonary/Chest: Effort normal and breath sounds normal.  Neurological: She is alert and oriented to person, place, and time.  Psychiatric: She has a normal mood and affect. Her behavior is normal.          Assessment & Plan:  [name].[name].[name] was seen today for weight loss and anxiety.  Diagnoses and all orders for this visit:  Class 2 obesity due to excess calories without serious comorbidity with body mass index (BMI) of 35.0 to 35.9 in adult -      naltrexone (DEPADE) 50 MG tablet; Take 0.5 tablets (25 mg total) by mouth 2 (two) times daily. -     buPROPion (WELLBUTRIN SR) 100 MG 12 hr tablet; Take 1 tablet (100 mg total) by mouth 2 (two) times daily.  S/P gastric bypass -     naltrexone (DEPADE) 50 MG tablet; Take 0.5 tablets (25 mg total) by mouth 2 (two) times daily.  Stress reaction -     buPROPion (WELLBUTRIN SR) 100 MG 12 hr tablet; Take 1 tablet (100 mg total) by mouth 2 (two) times daily. -     hydrOXYzine (ATARAX/VISTARIL) 25 MG tablet; Take 1 tablet (25 mg total) by mouth daily as needed. -     ALPRAZolam (XANAX) 0.5 MG tablet; Take 1 tablet (0.5 mg total) by mouth 2 (two) times daily as needed for anxiety.  Generalized anxiety disorder -     hydrOXYzine (ATARAX/VISTARIL) 25 MG tablet; Take 1 tablet (25 mg total) by mouth daily as needed. -     ALPRAZolam (XANAX) 0.5 MG tablet; Take 1 tablet (0.5 mg total) by mouth 2 (two) times daily as needed for anxiety.  Weight gain -     naltrexone (DEPADE) 50 MG tablet; Take 0.5 tablets (25 mg total) by mouth 2 (two) times daily. -     buPROPion (WELLBUTRIN SR) 100 MG 12 hr tablet; Take  1 tablet (100 mg total) by mouth 2 (two) times daily.  B12 deficiency -     cyanocobalamin (,VITAMIN B-12,) 1000 MCG/ML injection; Inject 1 mL (1,000 mcg total) into the muscle every 14 (fourteen) days. Please include needles.  .. Depression screen PHQ 2/9 06/16/2017  Decreased Interest 1  Down, Depressed, Hopeless 1  PHQ - 2 Score 2  Altered sleeping 2  Tired, decreased energy 2  Change in appetite 3  Feeling bad or failure about yourself  1  Trouble concentrating 2  Moving slowly or fidgety/restless 0  Suicidal thoughts 0  PHQ-9 Score 12  Difficult doing work/chores Very difficult  Some encounter information is confidential and restricted. Go to Review Flowsheets activity to see all data.   .. GAD 7 : Generalized Anxiety Score 06/16/2017  Nervous, Anxious, on Edge 3  Control/stop worrying 3   Worry too much - different things 3  Trouble relaxing 3  Restless 2  Easily annoyed or irritable 1  Afraid - awful might happen 1  Total GAD 7 Score 16  Some encounter information is confidential and restricted. Go to Review Flowsheets activity to see all data.    Did not get PHQ-9 and GAD-7 today. These are the last screenings.   Since wellbutrin also acts as a anti-depressant I do not want to change up anything else. Discussed side effects. If anxiety or agitation worsens please let me know and stop medication. Continue on lamictal and lexapro. Recently started xanax. Discussed with patient the dependence issue. I see in hx she had some problems with misuse. She must use them sparingly and we don't want to stay on 2 times a day. Discussed coping mechanisms. Pt is in counseling.    Pt aware of increased risk of seizure with the contrave components. She accepts risk and will stop medication if she has a seizure.   Discussed briefly that fibromyaglia is a dx of exclusion. Quickly palpated some tender trigger points. Follow up for further work up. Discussed exercise and mood control. Encouraged anti-inflammatory diet and tumeric to consider. sTAY hydrated.   [name].Spent 30 minutes with patient and greater than 50 percent of visit spent counseling patient regarding treatment plan.   Continue with with diet and exercise. Follow up in 6-8 weeks.

## 2017-10-02 ENCOUNTER — Other Ambulatory Visit: Payer: Self-pay | Admitting: Physician Assistant

## 2017-10-02 DIAGNOSIS — M5442 Lumbago with sciatica, left side: Principal | ICD-10-CM

## 2017-10-02 DIAGNOSIS — M5441 Lumbago with sciatica, right side: Secondary | ICD-10-CM

## 2017-10-03 ENCOUNTER — Encounter: Payer: Self-pay | Admitting: Physician Assistant

## 2017-10-03 DIAGNOSIS — Z6835 Body mass index (BMI) 35.0-35.9, adult: Principal | ICD-10-CM

## 2017-10-03 DIAGNOSIS — E6609 Other obesity due to excess calories: Secondary | ICD-10-CM | POA: Insufficient documentation

## 2017-10-03 DIAGNOSIS — R635 Abnormal weight gain: Secondary | ICD-10-CM | POA: Insufficient documentation

## 2017-10-13 ENCOUNTER — Encounter: Payer: Self-pay | Admitting: Physician Assistant

## 2017-10-13 DIAGNOSIS — R635 Abnormal weight gain: Secondary | ICD-10-CM

## 2017-10-13 DIAGNOSIS — Z6835 Body mass index (BMI) 35.0-35.9, adult: Secondary | ICD-10-CM

## 2017-10-13 DIAGNOSIS — E6609 Other obesity due to excess calories: Secondary | ICD-10-CM

## 2017-10-13 DIAGNOSIS — Z9884 Bariatric surgery status: Secondary | ICD-10-CM

## 2017-10-13 MED ORDER — NALTREXONE HCL 50 MG PO TABS: 25.0000 mg | ORAL_TABLET | Freq: Two times a day (BID) | ORAL | 1 refills | Status: DC

## 2017-10-24 ENCOUNTER — Other Ambulatory Visit: Payer: Self-pay | Admitting: Physician Assistant

## 2017-10-24 DIAGNOSIS — F430 Acute stress reaction: Secondary | ICD-10-CM

## 2017-10-24 DIAGNOSIS — R635 Abnormal weight gain: Secondary | ICD-10-CM

## 2017-10-24 DIAGNOSIS — E6609 Other obesity due to excess calories: Secondary | ICD-10-CM

## 2017-10-24 DIAGNOSIS — Z6835 Body mass index (BMI) 35.0-35.9, adult: Secondary | ICD-10-CM

## 2017-10-27 ENCOUNTER — Ambulatory Visit: Payer: BLUE CROSS/BLUE SHIELD | Admitting: Physician Assistant

## 2017-10-28 ENCOUNTER — Other Ambulatory Visit: Payer: Self-pay | Admitting: Physician Assistant

## 2017-10-28 DIAGNOSIS — F411 Generalized anxiety disorder: Secondary | ICD-10-CM

## 2017-10-28 DIAGNOSIS — F430 Acute stress reaction: Secondary | ICD-10-CM

## 2017-10-29 ENCOUNTER — Other Ambulatory Visit: Payer: Self-pay | Admitting: Physician Assistant

## 2017-10-29 DIAGNOSIS — Z9884 Bariatric surgery status: Secondary | ICD-10-CM

## 2017-10-29 DIAGNOSIS — E6609 Other obesity due to excess calories: Secondary | ICD-10-CM

## 2017-10-29 DIAGNOSIS — R635 Abnormal weight gain: Secondary | ICD-10-CM

## 2017-10-29 DIAGNOSIS — Z6835 Body mass index (BMI) 35.0-35.9, adult: Secondary | ICD-10-CM

## 2017-10-29 MED ORDER — BUPROPION HCL ER (SR) 200 MG PO TB12: 200.0000 mg | ORAL_TABLET | Freq: Two times a day (BID) | ORAL | 0 refills | Status: DC

## 2017-10-29 MED ORDER — NALTREXONE HCL 50 MG PO TABS: ORAL_TABLET | ORAL | 1 refills | Status: DC

## 2017-10-30 ENCOUNTER — Encounter: Payer: Self-pay | Admitting: Physician Assistant

## 2017-11-15 ENCOUNTER — Encounter: Payer: Self-pay | Admitting: Physician Assistant

## 2017-11-24 ENCOUNTER — Other Ambulatory Visit: Payer: Self-pay | Admitting: Physician Assistant

## 2017-11-30 ENCOUNTER — Other Ambulatory Visit: Payer: Self-pay | Admitting: Physician Assistant

## 2017-11-30 DIAGNOSIS — M5442 Lumbago with sciatica, left side: Principal | ICD-10-CM

## 2017-11-30 DIAGNOSIS — M5441 Lumbago with sciatica, right side: Secondary | ICD-10-CM

## 2017-12-01 DIAGNOSIS — Z6834 Body mass index (BMI) 34.0-34.9, adult: Secondary | ICD-10-CM | POA: Diagnosis not present

## 2017-12-01 DIAGNOSIS — Z01411 Encounter for gynecological examination (general) (routine) with abnormal findings: Secondary | ICD-10-CM | POA: Diagnosis not present

## 2017-12-01 DIAGNOSIS — R350 Frequency of micturition: Secondary | ICD-10-CM | POA: Diagnosis not present

## 2017-12-01 DIAGNOSIS — Z01419 Encounter for gynecological examination (general) (routine) without abnormal findings: Secondary | ICD-10-CM | POA: Diagnosis not present

## 2017-12-03 ENCOUNTER — Other Ambulatory Visit: Payer: Self-pay | Admitting: Physician Assistant

## 2017-12-03 DIAGNOSIS — Z9884 Bariatric surgery status: Secondary | ICD-10-CM

## 2017-12-03 DIAGNOSIS — R635 Abnormal weight gain: Secondary | ICD-10-CM

## 2017-12-03 DIAGNOSIS — Z6835 Body mass index (BMI) 35.0-35.9, adult: Secondary | ICD-10-CM

## 2017-12-03 DIAGNOSIS — E6609 Other obesity due to excess calories: Secondary | ICD-10-CM

## 2017-12-12 ENCOUNTER — Encounter: Payer: Self-pay | Admitting: Physician Assistant

## 2017-12-12 ENCOUNTER — Other Ambulatory Visit: Payer: Self-pay | Admitting: Physician Assistant

## 2017-12-12 ENCOUNTER — Ambulatory Visit (INDEPENDENT_AMBULATORY_CARE_PROVIDER_SITE_OTHER): Payer: BLUE CROSS/BLUE SHIELD | Admitting: Physician Assistant

## 2017-12-12 VITALS — BP 113/69 | HR 82 | Ht 65.0 in | Wt 200.0 lb

## 2017-12-12 DIAGNOSIS — F411 Generalized anxiety disorder: Secondary | ICD-10-CM

## 2017-12-12 DIAGNOSIS — M5441 Lumbago with sciatica, right side: Secondary | ICD-10-CM | POA: Diagnosis not present

## 2017-12-12 DIAGNOSIS — M5442 Lumbago with sciatica, left side: Secondary | ICD-10-CM | POA: Diagnosis not present

## 2017-12-12 DIAGNOSIS — F430 Acute stress reaction: Secondary | ICD-10-CM | POA: Diagnosis not present

## 2017-12-12 MED ORDER — KETOROLAC TROMETHAMINE 60 MG/2ML IM SOLN
60.0000 mg | Freq: Once | INTRAMUSCULAR | Status: AC
  Administered 2017-12-12: 60 mg via INTRAMUSCULAR

## 2017-12-12 MED ORDER — PREDNISONE 50 MG PO TABS: ORAL_TABLET | ORAL | 0 refills | Status: DC

## 2017-12-12 MED ORDER — ALPRAZOLAM 0.5 MG PO TABS: 0.5000 mg | ORAL_TABLET | Freq: Two times a day (BID) | ORAL | 1 refills | Status: DC | PRN

## 2017-12-12 MED ORDER — HYDROCODONE-ACETAMINOPHEN 5-325 MG PO TABS: 1.0000 | ORAL_TABLET | Freq: Three times a day (TID) | ORAL | 0 refills | Status: DC | PRN

## 2017-12-12 NOTE — Progress Notes (Signed)
Subjective:    Patient ID: Deanna Keller, female    DOB: 07-Aug-1980, 37 y.o.   MRN: [id number]  HPI  Pt is a 37 yo female with pmhx of GAD, lumbar DDD and radiculitis who presents to the clinic with 3 weeks of low back pain that radiates down both legs at times. She denies any new injuries. 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 She feels like the pain is worse at night when she lays down. She rates the pain 9/10 at times. She denies any bowel or bladder dysfunction or saddle anesthesia. She has tried some flexeril with little relief. She can't take NSAIds due to gastric bypass.   Pt request refills of xanax to get her through next round of classes and testing.   .. Active Ambulatory Problems    Diagnosis Date Noted  . History of alcoholism (HCC) 07/21/2013  . B12 deficiency 07/21/2013  . Iron deficiency 07/21/2013  . Depression 11/10/2013  . Elevated LFTs 11/10/2013  . Chronic migraine 11/10/2013  . Malabsorption 11/10/2013  . S/P gastric bypass 11/11/2013  . Vitamin D deficiency 11/11/2013  . Recovering alcoholic in remission (HCC) 06/16/2014  . Lumbar degenerative disc disease 06/20/2014  . Bariatric surgery status in pregnancy 02/10/2013  . Phlebectasia 10/21/2011  . Hypoglycemia following gastrointestinal surgery 04/12/2013  . Generalized seizure (HCC) 08/30/2014  . Left lumbar radiculitis 10/11/2014  . IUD check up 11/24/2014  . Postoperative blind loop syndrome 04/12/2013  . Generalized anxiety disorder 09/06/2015  . Acute bronchitis 12/26/2015  . Stress reaction 06/16/2017  . Class 2 obesity due to excess calories without serious comorbidity with body mass index (BMI) of 35.0 to 35.9 in adult 10/03/2017  . Weight gain 10/03/2017   Resolved Ambulatory Problems    Diagnosis Date Noted  . Viral URI 12/20/2014  . Benzodiazepine misuse (HCC) 09/06/2015   Past Medical History:  Diagnosis Date  . Alcohol abuse   . Anxiety       Review of Systems  All other systems reviewed and are negative.      Objective:   Physical Exam  Constitutional: She is oriented to person, place, and time. She appears well-developed and well-nourished.  HENT:  Head: Normocephalic and atraumatic.  Cardiovascular: Normal rate and regular rhythm.  Pulmonary/Chest: Effort normal and breath sounds normal.  Musculoskeletal:  Strength of lower extremity 5/5.  No tenderness over greater trochanters bilaterally.  Tenderness to palpation over bilateral SI joints.  No lumbar spinal tenderness.  Negative straight leg test.   Neurological: She is alert and oriented to person, place, and time. She displays normal reflexes. No cranial nerve deficit. Coordination normal.  Psychiatric: She has a normal mood and affect. Her behavior is normal.          Assessment & Plan:  Marland [name] KitchenDiagnoses and all orders for this visit:  Acute bilateral low back pain with bilateral sciatica -     predniSONE (DELTASONE) 50 MG tablet; Take one tablet for 5 days. -     HYDROcodone-acetaminophen (NORCO/VICODIN) 5-325 MG tablet; Take 1 tablet by mouth every 8 (eight) hours as needed for up to 5 days for moderate pain. -     ketorolac (TORADOL) injection 60 mg  Stress reaction -     ALPRAZolam (XANAX) 0.5 MG tablet; Take 1 tablet (0.5 mg total) by mouth 2 (two) times daily as needed for anxiety.  Generalized anxiety disorder -     ALPRAZolam (XANAX) 0.5 MG  tablet; Take 1 tablet (0.5 mg total) by mouth 2 (two) times daily as needed for anxiety.   Pain sounds consistent with SI dysfunction. Pt declines PT due to cost and time. toradol given today. Due to not being able to take NSAIDs gave a 5 day supply of as needed norco. She cannot take naltrexone with this or will not work. Burst of prednisone given. Encouraged heat and biofreeze. Stretches given to start. Consider massage.  [place] controlled substance database reviewed with no concerns.   I did refill xanax.  This was given to be a very short term thing through testing anxiety. She does have a hx of benzo misuse in the past and not exactly sure what that looked like. Had a discussed NOT to take xanax with short term norco for pain. I also discussed these are the last 2 regular prescriptions to be given. If desiring more will be a smaller quanity to taper off. Pt agrees with plan.   [name]..Spent 30 minutes with patient and greater than 50 percent of visit spent counseling patient regarding treatment plan.

## 2017-12-12 NOTE — Patient Instructions (Signed)
Sacroiliac Joint Dysfunction Sacroiliac joint dysfunction is a condition that causes inflammation on one or both sides of the sacroiliac (SI) joint. The SI joint connects the lower part of the spine (sacrum) with the two upper portions of the pelvis (ilium). This condition causes deep aching or burning pain in the low back. In some cases, the pain may also spread into one or both buttocks or hips or spread down the legs. What are the causes? This condition may be caused by:  Pregnancy. During pregnancy, extra stress is put on the SI joints because the pelvis widens.  Injury, such as: ? Car accidents. ? Sport-related injuries. ? Work-related injuries.  Having one leg that is shorter than the other.  Conditions that affect the joints, such as: ? Rheumatoid arthritis. ? Gout. ? Psoriatic arthritis. ? Joint infection (septic arthritis).  Sometimes, the cause of SI joint dysfunction is not known. What are the signs or symptoms? Symptoms of this condition include:  Aching or burning pain in the lower back. The pain may also spread to other areas, such as: ? Buttocks. ? Groin. ? Thighs and legs.  Muscle spasms in or around the painful areas.  Increased pain when standing, walking, running, stair climbing, bending, or lifting.  How is this diagnosed? Your health care provider will do a physical exam and take your medical history. During the exam, the health care provider may move one or both of your legs to different positions to check for pain. Various tests may be done to help verify the diagnosis, including:  Imaging tests to look for other causes of pain. These may include: ? MRI. ? CT scan. ? Bone scan.  Diagnostic injection. A numbing medicine is injected into the SI joint using a needle. If the pain is temporarily improved or stopped after the injection, this can indicate that SI joint dysfunction is the problem.  How is this treated? Treatment may vary depending on the  cause and severity of your condition. Treatment options may include:  Applying ice or heat to the lower back area. This can help to reduce pain and muscle spasms.  Medicines to relieve pain or inflammation or to relax the muscles.  Wearing a back brace (sacroiliac brace) to help support the joint while your back is healing.  Physical therapy to increase muscle strength around the joint and flexibility at the joint. This may also involve learning proper body positions and ways of moving to relieve stress on the joint.  Direct manipulation of the SI joint.  Injections of steroid medicine into the joint in order to reduce pain and swelling.  Radiofrequency ablation to burn away nerves that are carrying pain messages from the joint.  Use of a device that provides electrical stimulation in order to reduce pain at the joint.  Surgery to put in screws and plates that limit or prevent joint motion. This is rare.  Follow these instructions at home:  Rest as needed. Limit your activities as directed by your health care provider.  Take medicines only as directed by your health care provider.  If directed, apply ice to the affected area: ? Put ice in a plastic bag. ? Place a towel between your skin and the bag. ? Leave the ice on for 20 minutes, 2-3 times per day.  Use a heating pad or a moist heat pack as directed by your health care provider.  Exercise as directed by your health care provider or physical therapist.  Keep all follow-up visits   as directed by your health care provider. This is important. Contact a health care provider if:  Your pain is not controlled with medicine.  You have a fever.  You have increasingly severe pain. Get help right away if:  You have weakness, numbness, or tingling in your legs or feet.  You lose control of your bladder or bowel. This information is not intended to replace advice given to you by your health care provider. Make sure you discuss  any questions you have with your health care provider. Document Released: 06/28/2008 Document Revised: 09/07/2015 Document Reviewed: 12/07/2013 Elsevier Interactive Patient Education  2018 [employer] Piriformis Syndrome Rehab Ask your health care provider which exercises are safe for you. Do exercises exactly as told by your health care provider and adjust them as directed. It is normal to feel mild stretching, pulling, tightness, or discomfort as you do these exercises, but you should stop right away if you feel sudden pain or your pain gets worse.Do not begin these exercises until told by your health care provider. Stretching and range of motion exercises These exercises warm up your muscles and joints and improve the movement and flexibility of your hip and pelvis. These exercises also help to relieve pain, numbness, and tingling. Exercise A: Hip rotators  1. Lie on your back on a firm surface. 2. Pull your left / right knee toward your same shoulder with your left / right hand until your knee is pointing toward the ceiling. Hold your left / right ankle with your other hand. 3. Keeping your knee steady, gently pull your left / right ankle toward your other shoulder until you feel a stretch in your buttocks. 4. Hold this position for __________ seconds. Repeat __________ times. Complete this stretch __________ times a day. Exercise B: Hip extensors 1. Lie on your back on a firm surface. Both of your legs should be straight. 2. Pull your left / right knee to your chest. Hold your leg in this position by holding onto the back of your thigh or the front of your knee. 3. Hold this position for __________ seconds. 4. Slowly return to the starting position. Repeat __________ times. Complete this stretch __________ times a day. Strengthening exercises These exercises build strength and endurance in your hip and thigh muscles. Endurance is the ability to use your muscles for a long time, even  after they get tired. Exercise C: Straight leg raises ( hip abductors) 1. Lie on your side with your left / right leg in the top position. Lie so your head, shoulder, knee, and hip line up. Bend your bottom knee to help you balance. 2. Lift your top leg up 4-6 inches (10-15 cm), keeping your toes pointed straight ahead. 3. Hold this position for __________ seconds. 4. Slowly lower your leg to the starting position. Let your muscles relax completely. Repeat __________ times. Complete this exercise__________ times a day. Exercise D: Hip abductors and rotators, quadruped  1. Get on your hands and knees on a firm, lightly padded surface. Your hands should be directly below your shoulders, and your knees should be directly below your hips. 2. Lift your left / right knee out to the side. Keep your knee bent. Do not twist your body. 3. Hold this position for __________ seconds. 4. Slowly lower your leg. Repeat __________ times. Complete this exercise__________ times a day. Exercise E: Straight leg raises ( hip extensors) 1. Lie on your abdomen on a bed or a firm surface with a pillow  under your hips. 2. Squeeze your buttock muscles and lift your left / right thigh off the bed. Do not let your back arch. 3. Hold this position for __________ seconds. 4. Slowly return to the starting position. Let your muscles relax completely before doing another repetition. Repeat __________ times. Complete this exercise__________ times a day. This information is not intended to replace advice given to you by your health care provider. Make sure you discuss any questions you have with your health care provider. Document Released: 04/01/2005 Document Revised: 12/05/2015 Document Reviewed: 03/14/2015 Elsevier Interactive Patient Education  [employer].

## 2017-12-14 ENCOUNTER — Encounter: Payer: Self-pay | Admitting: Physician Assistant

## 2017-12-16 ENCOUNTER — Encounter: Payer: Self-pay | Admitting: Physician Assistant

## 2017-12-16 MED ORDER — POLYMYXIN B-TRIMETHOPRIM 10000-0.1 UNIT/ML-% OP SOLN: 1.0000 [drp] | OPHTHALMIC | 0 refills | Status: DC

## 2017-12-21 ENCOUNTER — Encounter: Payer: Self-pay | Admitting: Physician Assistant

## 2017-12-22 MED ORDER — AZITHROMYCIN 250 MG PO TABS: ORAL_TABLET | ORAL | 0 refills | Status: DC

## 2018-01-14 ENCOUNTER — Encounter: Payer: Self-pay | Admitting: Physician Assistant

## 2018-01-21 ENCOUNTER — Encounter: Payer: Self-pay | Admitting: Physician Assistant

## 2018-01-25 ENCOUNTER — Other Ambulatory Visit: Payer: Self-pay | Admitting: Physician Assistant

## 2018-01-25 DIAGNOSIS — M5442 Lumbago with sciatica, left side: Principal | ICD-10-CM

## 2018-01-25 DIAGNOSIS — M5441 Lumbago with sciatica, right side: Secondary | ICD-10-CM

## 2018-02-04 ENCOUNTER — Ambulatory Visit (INDEPENDENT_AMBULATORY_CARE_PROVIDER_SITE_OTHER): Payer: BLUE CROSS/BLUE SHIELD | Admitting: Physician Assistant

## 2018-02-04 ENCOUNTER — Encounter: Payer: Self-pay | Admitting: Physician Assistant

## 2018-02-04 VITALS — BP 90/70 | HR 97 | Ht 65.0 in | Wt 206.0 lb

## 2018-02-04 DIAGNOSIS — R635 Abnormal weight gain: Secondary | ICD-10-CM | POA: Diagnosis not present

## 2018-02-04 DIAGNOSIS — R5383 Other fatigue: Secondary | ICD-10-CM

## 2018-02-04 DIAGNOSIS — Z8639 Personal history of other endocrine, nutritional and metabolic disease: Secondary | ICD-10-CM

## 2018-02-04 DIAGNOSIS — M5441 Lumbago with sciatica, right side: Secondary | ICD-10-CM | POA: Diagnosis not present

## 2018-02-04 DIAGNOSIS — E010 Iodine-deficiency related diffuse (endemic) goiter: Secondary | ICD-10-CM

## 2018-02-04 DIAGNOSIS — M5136 Other intervertebral disc degeneration, lumbar region: Secondary | ICD-10-CM

## 2018-02-04 DIAGNOSIS — M5442 Lumbago with sciatica, left side: Secondary | ICD-10-CM

## 2018-02-04 MED ORDER — KETOROLAC TROMETHAMINE 60 MG/2ML IM SOLN
60.0000 mg | Freq: Once | INTRAMUSCULAR | Status: AC
  Administered 2018-02-04: 60 mg via INTRAMUSCULAR

## 2018-02-04 MED ORDER — HYDROCODONE-ACETAMINOPHEN 5-325 MG PO TABS: 1.0000 | ORAL_TABLET | Freq: Three times a day (TID) | ORAL | 0 refills | Status: AC | PRN

## 2018-02-04 MED ORDER — PREDNISONE 50 MG PO TABS: ORAL_TABLET | ORAL | 0 refills | Status: DC

## 2018-02-04 NOTE — Progress Notes (Signed)
Subjective:    Patient ID: Deanna Keller, female    DOB: 1980-05-06, 37 y.o.   MRN: [id number]  HPI  Pt is a 37 yo obese female with hx of thyroid nodule and bilateral low back pain who presents to the clinic with low back pain returned. She presented to the clinic a few weeks ago and got a shot of toradol, burst of prednisone, flexeril, norco. She did get much better and slowly the low back pain began to return. Back pain is now is full force. Pain makes uncomfortable to sit. She feels like right is worse than left. No new injury. She does not feel like flexeril is helping. She has had PT before and felt like it was too expensive and time consuming. She cannot take NSAIDs due to gastric bypass. MRI back in 2016 and she did get epidural injections. They did help.   She does feel like her weight is making it worse. She has gained 6lbs after stopping naltrexone. She has no energy. She does wonder about her thyroid. She does have a hx of thyroid nodule that has not been followed up on in a few years. Never tested for sleep apnea.   .. Active Ambulatory Problems    Diagnosis Date Noted  . History of alcoholism (HCC) 07/21/2013  . B12 deficiency 07/21/2013  . Iron deficiency 07/21/2013  . Depression 11/10/2013  . Elevated LFTs 11/10/2013  . Chronic migraine 11/10/2013  . Malabsorption 11/10/2013  . S/P gastric bypass 11/11/2013  . Vitamin D deficiency 11/11/2013  . Recovering alcoholic in remission (HCC) 06/16/2014  . Lumbar degenerative disc disease 06/20/2014  . Bariatric surgery status in pregnancy 02/10/2013  . Phlebectasia 10/21/2011  . Hypoglycemia following gastrointestinal surgery 04/12/2013  . Generalized seizure (HCC) 08/30/2014  . Left lumbar radiculitis 10/11/2014  . IUD check up 11/24/2014  . Postoperative blind loop syndrome 04/12/2013  . Generalized anxiety disorder 09/06/2015  . Acute bronchitis 12/26/2015  . Stress reaction 06/16/2017  . Class 2 obesity due to excess  calories without serious comorbidity with body mass index (BMI) of 35.0 to 35.9 in adult 10/03/2017  . Weight gain 10/03/2017  . No energy 02/05/2018  . Thyromegaly 02/05/2018  . Acute bilateral low back pain with bilateral sciatica 02/05/2018  . Hx of thyroid nodule 02/08/2018   Resolved Ambulatory Problems    Diagnosis Date Noted  . Viral URI 12/20/2014  . Benzodiazepine misuse (HCC) 09/06/2015   Past Medical History:  Diagnosis Date  . Alcohol abuse   . Anxiety        Review of Systems See HPI.     Objective:   Physical Exam  Constitutional: She is oriented to person, place, and time. She appears well-developed and well-nourished.  HENT:  Head: Normocephalic and atraumatic.  Eyes: Conjunctivae are normal.  Neck: Thyromegaly present.  Cardiovascular: Normal rate and regular rhythm.  Musculoskeletal:  Limited ROM at waist due to pain. Pain with twisting. Pain to palpation over paraspinal lumbar muscles and into right SI joiint.  Negative straight leg test.   Lymphadenopathy:    She has no cervical adenopathy.  Neurological: She is alert and oriented to person, place, and time.  Skin: No rash noted.  Psychiatric: She has a normal mood and affect. Her behavior is normal.          Assessment & Plan:  Marland [name] KitchenDiagnoses and all orders for this visit:  Acute bilateral low back pain with bilateral sciatica -     HYDROcodone-acetaminophen (NORCO/VICODIN)  5-325 MG tablet; Take 1 tablet by mouth every 8 (eight) hours as needed for up to 5 days for moderate pain. -     predniSONE (DELTASONE) 50 MG tablet; Take one tablet for 5 days. -     ketorolac (TORADOL) injection 60 mg  No energy -     CBC -     Comprehensive metabolic panel -     C-reactive protein -     Ferritin -     Sedimentation rate -     TSH -     Vitamin B12 -     Vit D  25 hydroxy (rtn osteoporosis monitoring) -     US THYROID  Weight gain -     CBC -     Comprehensive metabolic panel -     C-reactive  protein -     Ferritin -     Sedimentation rate -     TSH -     Vitamin B12 -     Vit D  25 hydroxy (rtn osteoporosis monitoring)  Thyromegaly -     US THYROID  Hx of thyroid nodule -     US THYROID  Lumbar degenerative disc disease -     HYDROcodone-acetaminophen (NORCO/VICODIN) 5-325 MG tablet; Take 1 tablet by mouth every 8 (eight) hours as needed for up to 5 days for moderate pain. -     predniSONE (DELTASONE) 50 MG tablet; Take one tablet for 5 days. -     ketorolac (TORADOL) injection 60 mg   Pt has a hx of LDD and low back pain. Likely needs epidural injections again. Follow up with Dr. Karie Schwalbe. toradol shot given today with prednisone burst. Small quanity of norco given due to not being able to take NSAIDs. Pt does have some addiction potential. Discussed needing to find other ways for pain control. I will not continue to give norco long term. Strongly consider massage therapy, heat, home stretches.   Will follow up on thyromegaly with u/s.   Ordered labs for fatigue. Pt declined sleep apnea testing for now.   Certainly pain effects mood and mood fatigue. Will continue to work up and look for other reasons of fatigue.   [name].Spent 30 minutes with patient and greater than 50 percent of visit spent counseling patient regarding treatment plan.

## 2018-02-04 NOTE — Patient Instructions (Addendum)
[facility] [employer] Therapist, sports-  Will order Thyroid U/S.  Will get labs.  Make appt with Dr. Karie Schwalbe.  Prednisone given.

## 2018-02-05 DIAGNOSIS — R5383 Other fatigue: Secondary | ICD-10-CM | POA: Insufficient documentation

## 2018-02-05 DIAGNOSIS — M549 Dorsalgia, unspecified: Secondary | ICD-10-CM | POA: Insufficient documentation

## 2018-02-05 DIAGNOSIS — E010 Iodine-deficiency related diffuse (endemic) goiter: Secondary | ICD-10-CM | POA: Insufficient documentation

## 2018-02-05 DIAGNOSIS — M5441 Lumbago with sciatica, right side: Secondary | ICD-10-CM | POA: Insufficient documentation

## 2018-02-05 DIAGNOSIS — M5442 Lumbago with sciatica, left side: Principal | ICD-10-CM

## 2018-02-08 ENCOUNTER — Encounter: Payer: Self-pay | Admitting: Physician Assistant

## 2018-02-08 DIAGNOSIS — Z8639 Personal history of other endocrine, nutritional and metabolic disease: Secondary | ICD-10-CM | POA: Insufficient documentation

## 2018-02-09 ENCOUNTER — Encounter: Payer: Self-pay | Admitting: Physician Assistant

## 2018-02-09 ENCOUNTER — Other Ambulatory Visit: Payer: Self-pay

## 2018-02-10 MED ORDER — BUSPIRONE HCL 10 MG PO TABS: 10.0000 mg | ORAL_TABLET | Freq: Three times a day (TID) | ORAL | 1 refills | Status: DC

## 2018-02-18 ENCOUNTER — Telehealth: Payer: Self-pay

## 2018-02-18 NOTE — Telephone Encounter (Signed)
Alexee called today saying that she has had a bad cough for the past few days, and was wanting to see if you could please call her out some of the hydrocodone cough medicine? Pharmacy is [facility] on [address] in [place]. Thanks!

## 2018-02-19 NOTE — Telephone Encounter (Signed)
I can not call in controlled substance without office visit for this. Delsym can be helpful. nyquil could help.

## 2018-02-20 ENCOUNTER — Telehealth: Payer: Self-pay

## 2018-02-20 ENCOUNTER — Other Ambulatory Visit: Payer: Self-pay

## 2018-02-20 MED ORDER — BENZONATATE 200 MG PO CAPS: 200.0000 mg | ORAL_CAPSULE | Freq: Two times a day (BID) | ORAL | 0 refills | Status: DC | PRN

## 2018-02-20 MED ORDER — BENZONATATE 200 MG PO CAPS: 200.0000 mg | ORAL_CAPSULE | Freq: Three times a day (TID) | ORAL | 0 refills | Status: DC | PRN

## 2018-02-20 NOTE — Telephone Encounter (Signed)
Deanna Keller is one of Deanna Keller's patients, and she called on Wednesday because she has had a cough and cold for the past week, and was requesting some hydrocodone cough medicine. Deanna Keller denied that request, due to patient not being able to come in to be seen. I called to notify patient this morning. Patient is now requesting a prescription for Tessalon for her cough if possible. I saw that you are covering Deanna Keller's stuff today, and I wanted to ask you and see if Deanna Keller could be called out for [facility]. Patient is worried since Deanna Keller is out, she won't be able to get medication called out in time before the weekend. Thanks!

## 2018-02-20 NOTE — Telephone Encounter (Signed)
Called patient and she states she is currently taking Delsym, but was wondering if you could prescribe Tessalon? Thanks!

## 2018-02-20 NOTE — Telephone Encounter (Signed)
Pearls sent.

## 2018-02-20 NOTE — Telephone Encounter (Signed)
No problem, sent in!

## 2018-03-02 ENCOUNTER — Other Ambulatory Visit: Payer: Self-pay | Admitting: Physician Assistant

## 2018-03-27 ENCOUNTER — Other Ambulatory Visit: Payer: Self-pay | Admitting: Physician Assistant

## 2018-03-27 DIAGNOSIS — M5441 Lumbago with sciatica, right side: Secondary | ICD-10-CM

## 2018-03-27 DIAGNOSIS — M5442 Lumbago with sciatica, left side: Principal | ICD-10-CM

## 2018-03-30 ENCOUNTER — Telehealth: Payer: Self-pay

## 2018-03-30 NOTE — Telephone Encounter (Signed)
Deanna Keller called over the weekend to the [facility] on Friday. She reported having nausea, fever, chills and a terrible headache. She is feeling better today. She states the headache is mostly gone. I advised her to call back if symptoms persist or worsen.

## 2018-04-02 ENCOUNTER — Other Ambulatory Visit: Payer: Self-pay | Admitting: Physician Assistant

## 2018-04-05 ENCOUNTER — Other Ambulatory Visit: Payer: Self-pay | Admitting: Physician Assistant

## 2018-04-06 NOTE — Telephone Encounter (Signed)
Requesting RF on Xanax  Last RX sent 12/12/17 for #30 with 1 RF  RX pended, please review and send if appropriate

## 2018-04-15 ENCOUNTER — Other Ambulatory Visit: Payer: Self-pay | Admitting: Physician Assistant

## 2018-04-15 DIAGNOSIS — F430 Acute stress reaction: Secondary | ICD-10-CM

## 2018-04-15 DIAGNOSIS — F331 Major depressive disorder, recurrent, moderate: Secondary | ICD-10-CM

## 2018-04-15 DIAGNOSIS — F411 Generalized anxiety disorder: Secondary | ICD-10-CM

## 2018-04-17 ENCOUNTER — Other Ambulatory Visit: Payer: Self-pay | Admitting: Physician Assistant

## 2018-04-17 ENCOUNTER — Encounter: Payer: Self-pay | Admitting: Physician Assistant

## 2018-04-17 NOTE — Telephone Encounter (Signed)
No action is needed.  

## 2018-04-18 ENCOUNTER — Other Ambulatory Visit: Payer: Self-pay | Admitting: Physician Assistant

## 2018-04-20 NOTE — Telephone Encounter (Signed)
Refill request came through I saw a note saying that the medication is not lasting patient a full month due to anxiety. Please advise.

## 2018-04-23 ENCOUNTER — Other Ambulatory Visit: Payer: Self-pay | Admitting: Physician Assistant

## 2018-04-28 ENCOUNTER — Encounter: Payer: Self-pay | Admitting: Physician Assistant

## 2018-05-01 ENCOUNTER — Other Ambulatory Visit: Payer: Self-pay | Admitting: Physician Assistant

## 2018-05-04 ENCOUNTER — Other Ambulatory Visit: Payer: Self-pay | Admitting: Physician Assistant

## 2018-05-04 DIAGNOSIS — M5442 Lumbago with sciatica, left side: Principal | ICD-10-CM

## 2018-05-04 DIAGNOSIS — M5441 Lumbago with sciatica, right side: Secondary | ICD-10-CM

## 2018-05-04 MED ORDER — PHENTERMINE HCL 15 MG PO CAPS: 15.0000 mg | ORAL_CAPSULE | ORAL | 0 refills | Status: DC

## 2018-05-04 NOTE — Telephone Encounter (Signed)
Last BP 90/70. Will start low dose and then follow up in 1 month to check for symptoms and vitals.

## 2018-05-13 MED ORDER — PHENTERMINE HCL 37.5 MG PO TABS: 37.5000 mg | ORAL_TABLET | Freq: Every day | ORAL | 0 refills | Status: DC

## 2018-05-13 NOTE — Addendum Note (Signed)
Addended by: Jomarie Longs on: 05/13/2018 03:43 PM   Modules accepted: Orders

## 2018-05-26 ENCOUNTER — Encounter: Payer: Self-pay | Admitting: Physician Assistant

## 2018-05-26 DIAGNOSIS — R5383 Other fatigue: Secondary | ICD-10-CM

## 2018-05-29 ENCOUNTER — Other Ambulatory Visit: Payer: Self-pay | Admitting: Physician Assistant

## 2018-06-01 ENCOUNTER — Other Ambulatory Visit: Payer: Self-pay | Admitting: Physician Assistant

## 2018-06-01 DIAGNOSIS — F331 Major depressive disorder, recurrent, moderate: Secondary | ICD-10-CM

## 2018-06-01 DIAGNOSIS — F430 Acute stress reaction: Secondary | ICD-10-CM

## 2018-06-01 DIAGNOSIS — F411 Generalized anxiety disorder: Secondary | ICD-10-CM

## 2018-06-01 DIAGNOSIS — M5441 Lumbago with sciatica, right side: Secondary | ICD-10-CM

## 2018-06-01 DIAGNOSIS — M5442 Lumbago with sciatica, left side: Secondary | ICD-10-CM

## 2018-06-03 DIAGNOSIS — R5383 Other fatigue: Secondary | ICD-10-CM | POA: Diagnosis not present

## 2018-06-04 LAB — CBC WITH DIFFERENTIAL/PLATELET
Absolute Monocytes: 456 cells/uL (ref 200–950)
Basophils Absolute: 39 cells/uL (ref 0–200)
Basophils Relative: 0.9 %
Eosinophils Absolute: 120 cells/uL (ref 15–500)
Eosinophils Relative: 2.8 %
HCT: 41 % (ref 35.0–45.0)
Hemoglobin: 14.1 g/dL (ref 11.7–15.5)
Lymphs Abs: 1428 cells/uL (ref 850–3900)
MCH: 30.8 pg (ref 27.0–33.0)
MCHC: 34.4 g/dL (ref 32.0–36.0)
MCV: 89.5 fL (ref 80.0–100.0)
MPV: 10 fL (ref 7.5–12.5)
Monocytes Relative: 10.6 %
Neutro Abs: 2258 cells/uL (ref 1500–7800)
Neutrophils Relative %: 52.5 %
Platelets: 341 10*3/uL (ref 140–400)
RBC: 4.58 10*6/uL (ref 3.80–5.10)
RDW: 12 % (ref 11.0–15.0)
Total Lymphocyte: 33.2 %
WBC: 4.3 10*3/uL (ref 3.8–10.8)

## 2018-06-04 LAB — C-REACTIVE PROTEIN: CRP: 0.3 mg/L (ref <8.0)

## 2018-06-04 LAB — FERRITIN: Ferritin: 104 ng/mL (ref 16–154)

## 2018-06-04 LAB — COMPLETE METABOLIC PANEL WITH GFR
AG Ratio: 1.8 (calc) (ref 1.0–2.5)
ALT: 34 U/L — ABNORMAL HIGH (ref 6–29)
AST: 32 U/L — ABNORMAL HIGH (ref 10–30)
Albumin: 4.4 g/dL (ref 3.6–5.1)
Alkaline phosphatase (APISO): 77 U/L (ref 31–125)
BUN: 8 mg/dL (ref 7–25)
CO2: 30 mmol/L (ref 20–32)
Calcium: 9.6 mg/dL (ref 8.6–10.2)
Chloride: 102 mmol/L (ref 98–110)
Creat: 0.66 mg/dL (ref 0.50–1.10)
GFR, Est African American: 132 mL/min/{1.73_m2} (ref >=60)
GFR, Est Non African American: 114 mL/min/{1.73_m2} (ref >=60)
Globulin: 2.5 g/dL (calc) (ref 1.9–3.7)
Glucose, Bld: 102 mg/dL — ABNORMAL HIGH (ref 65–99)
Potassium: 4.4 mmol/L (ref 3.5–5.3)
Sodium: 139 mmol/L (ref 135–146)
Total Bilirubin: 0.7 mg/dL (ref 0.2–1.2)
Total Protein: 6.9 g/dL (ref 6.1–8.1)

## 2018-06-04 LAB — VITAMIN B12: Vitamin B-12: 929 pg/mL (ref 200–1100)

## 2018-06-04 LAB — VITAMIN D 25 HYDROXY (VIT D DEFICIENCY, FRACTURES): Vit D, 25-Hydroxy: 33 ng/mL (ref 30–100)

## 2018-06-05 ENCOUNTER — Ambulatory Visit: Payer: BLUE CROSS/BLUE SHIELD | Admitting: Physician Assistant

## 2018-06-05 NOTE — Telephone Encounter (Signed)
We can go over at upcoming appt.  B12 GREAT.  Vitamin d normal but low normal [id number]  at least daily.  Iron stores great.

## 2018-06-08 ENCOUNTER — Ambulatory Visit: Payer: BLUE CROSS/BLUE SHIELD | Admitting: Physician Assistant

## 2018-06-09 ENCOUNTER — Ambulatory Visit (INDEPENDENT_AMBULATORY_CARE_PROVIDER_SITE_OTHER): Payer: BLUE CROSS/BLUE SHIELD | Admitting: Physician Assistant

## 2018-06-09 ENCOUNTER — Encounter: Payer: Self-pay | Admitting: Physician Assistant

## 2018-06-09 VITALS — BP 122/77 | HR 86 | Temp 98.9°F | Resp 16 | Ht 65.0 in | Wt 199.0 lb

## 2018-06-09 DIAGNOSIS — F411 Generalized anxiety disorder: Secondary | ICD-10-CM | POA: Diagnosis not present

## 2018-06-09 DIAGNOSIS — F331 Major depressive disorder, recurrent, moderate: Secondary | ICD-10-CM

## 2018-06-09 DIAGNOSIS — M5441 Lumbago with sciatica, right side: Secondary | ICD-10-CM

## 2018-06-09 DIAGNOSIS — F430 Acute stress reaction: Secondary | ICD-10-CM | POA: Diagnosis not present

## 2018-06-09 DIAGNOSIS — E538 Deficiency of other specified B group vitamins: Secondary | ICD-10-CM

## 2018-06-09 DIAGNOSIS — M5442 Lumbago with sciatica, left side: Secondary | ICD-10-CM

## 2018-06-09 DIAGNOSIS — E6609 Other obesity due to excess calories: Secondary | ICD-10-CM

## 2018-06-09 DIAGNOSIS — R5383 Other fatigue: Secondary | ICD-10-CM

## 2018-06-09 DIAGNOSIS — M2550 Pain in unspecified joint: Secondary | ICD-10-CM

## 2018-06-09 DIAGNOSIS — Z8619 Personal history of other infectious and parasitic diseases: Secondary | ICD-10-CM

## 2018-06-09 DIAGNOSIS — Z20828 Contact with and (suspected) exposure to other viral communicable diseases: Secondary | ICD-10-CM

## 2018-06-09 DIAGNOSIS — Z6835 Body mass index (BMI) 35.0-35.9, adult: Secondary | ICD-10-CM

## 2018-06-09 MED ORDER — HYDROXYZINE HCL 25 MG PO TABS: 25.0000 mg | ORAL_TABLET | Freq: Every day | ORAL | 1 refills | Status: DC | PRN

## 2018-06-09 MED ORDER — CYANOCOBALAMIN 1000 MCG/ML IJ KIT: PACK | INTRAMUSCULAR | 2 refills | Status: DC

## 2018-06-09 MED ORDER — PHENTERMINE HCL 37.5 MG PO TABS: ORAL_TABLET | ORAL | 0 refills | Status: DC

## 2018-06-09 MED ORDER — LAMOTRIGINE 100 MG PO TABS: ORAL_TABLET | ORAL | 1 refills | Status: DC

## 2018-06-09 MED ORDER — ESCITALOPRAM OXALATE 20 MG PO TABS: 20.0000 mg | ORAL_TABLET | Freq: Every day | ORAL | 1 refills | Status: DC

## 2018-06-09 MED ORDER — BUSPIRONE HCL 10 MG PO TABS: 10.0000 mg | ORAL_TABLET | Freq: Three times a day (TID) | ORAL | 1 refills | Status: DC

## 2018-06-09 MED ORDER — BALOXAVIR MARBOXIL(80 MG DOSE) 2 X 40 MG PO TBPK: 2.0000 | ORAL_TABLET | Freq: Once | ORAL | 0 refills | Status: AC

## 2018-06-09 MED ORDER — CYCLOBENZAPRINE HCL 10 MG PO TABS: 10.0000 mg | ORAL_TABLET | Freq: Every day | ORAL | 1 refills | Status: DC

## 2018-06-09 NOTE — Progress Notes (Signed)
Subjective:    Patient ID: Deanna Keller, female    DOB: 04-10-1981, 38 y.o.   MRN: [id number]  HPI  Pt is a 38 yo obese female who presents to the clinic for follow up.   She has lost 7lbs since starting phentermine. She feels like this is working really well. She denies any palpitations, headaches, vision changes, insomnia. She is walking more.   She does mention how low energy she is. She just feels "drained". She is napping more. She had a flu exposure and feeling achy this morning. She feels worse this morning but overall being struggling more with it. She feels like this winter she has stayed sick. Overall her mood has been ok. She does need refills. No SI/HC.  She also mentions random joint pain of bilateral hands, knees, shoulders. She wonders about autoimmune diseases. She does endorse some stiffiness. No family hx of RA or lupus.   .. Active Ambulatory Problems    Diagnosis Date Noted  . History of alcoholism (Deanna Keller) 07/21/2013  . B12 deficiency 07/21/2013  . Iron deficiency 07/21/2013  . Depression 11/10/2013  . Elevated LFTs 11/10/2013  . Chronic migraine 11/10/2013  . Malabsorption 11/10/2013  . S/P gastric bypass 11/11/2013  . Vitamin D deficiency 11/11/2013  . Recovering alcoholic in remission (Deanna Keller) 06/16/2014  . Lumbar degenerative disc disease 06/20/2014  . Bariatric surgery status in pregnancy 02/10/2013  . Phlebectasia 10/21/2011  . Hypoglycemia following gastrointestinal surgery 04/12/2013  . Generalized seizure (Deanna Keller) 08/30/2014  . Left lumbar radiculitis 10/11/2014  . IUD check up 11/24/2014  . Postoperative blind loop syndrome 04/12/2013  . Generalized anxiety disorder 09/06/2015  . Acute bronchitis 12/26/2015  . Stress reaction 06/16/2017  . Class 2 obesity due to excess calories without serious comorbidity with body mass index (BMI) of 35.0 to 35.9 in adult 10/03/2017  . Weight gain 10/03/2017  . No energy 02/05/2018  . Thyromegaly 02/05/2018  . Acute  bilateral low back pain with bilateral sciatica 02/05/2018  . Hx of thyroid nodule 02/08/2018  . Frequent infections 06/16/2018  . Arthralgia 06/16/2018   Resolved Ambulatory Problems    Diagnosis Date Noted  . Viral URI 12/20/2014  . Benzodiazepine misuse (Deanna Keller) 09/06/2015   Past Medical History:  Diagnosis Date  . Alcohol abuse   . Anxiety         Review of Systems See HPI.     Objective:   Physical Exam Vitals signs reviewed.  Constitutional:      Appearance: Normal appearance.  HENT:     Head: Normocephalic and atraumatic.     Right Ear: Tympanic membrane normal.     Left Ear: Tympanic membrane normal.     Nose: Nose normal.     Mouth/Throat:     Mouth: Mucous membranes are moist.     Pharynx: Posterior oropharyngeal erythema present.  Eyes:     Conjunctiva/sclera: Conjunctivae normal.  Cardiovascular:     Rate and Rhythm: Normal rate and regular rhythm.     Pulses: Normal pulses.  Pulmonary:     Effort: Pulmonary effort is normal.     Breath sounds: Normal breath sounds.  Abdominal:     General: Bowel sounds are normal.     Palpations: Abdomen is soft.  Musculoskeletal:     Comments: No swelling of joints of hands.  5/5 hand grip.   Neurological:     General: No focal deficit present.     Mental Status: She is alert and oriented to person,  place, and time.  Psychiatric:        Mood and Affect: Mood normal.        Behavior: Behavior normal.           Assessment & Plan:  Marland [name] KitchenYarisbel was seen today for phentermine and bloodwork follow up.  Diagnoses and all orders for this visit:  Moderate episode of recurrent major depressive disorder (HCC) -     lamoTRIgine (LAMICTAL) 100 MG tablet; 3x times daily -     escitalopram (LEXAPRO) 20 MG tablet; Take 1 tablet (20 mg total) by mouth daily. . -     busPIRone (BUSPAR) 10 MG tablet; Take 1 tablet (10 mg total) by mouth 3 (three) times daily.  Stress reaction -     escitalopram (LEXAPRO) 20 MG tablet;  Take 1 tablet (20 mg total) by mouth daily. . -     hydrOXYzine (ATARAX/VISTARIL) 25 MG tablet; Take 1 tablet (25 mg total) by mouth daily as needed.  Generalized anxiety disorder -     lamoTRIgine (LAMICTAL) 100 MG tablet; 3x times daily -     escitalopram (LEXAPRO) 20 MG tablet; Take 1 tablet (20 mg total) by mouth daily. . -     busPIRone (BUSPAR) 10 MG tablet; Take 1 tablet (10 mg total) by mouth 3 (three) times daily. -     hydrOXYzine (ATARAX/VISTARIL) 25 MG tablet; Take 1 tablet (25 mg total) by mouth daily as needed.  Acute bilateral low back pain with bilateral sciatica -     cyclobenzaprine (FLEXERIL) 10 MG tablet; Take 1 tablet (10 mg total) by mouth at bedtime.  Frequent infections -     ANA -     IgG, IgA, IgM -     TSH  Arthralgia, unspecified joint -     ANA -     TSH  No energy -     TSH  Exposure to the flu -     Baloxavir Marboxil,80 MG Dose, (XOFLUZA) 2 x 40 MG TBPK; Take 2 tablets by mouth once for 1 dose.  B12 deficiency -     Cyanocobalamin 1000 MCG/ML KIT; Inject 1 mL every two weeks.  Class 2 obesity due to excess calories without serious comorbidity with body mass index (BMI) of 35.0 to 35.9 in adult  Other orders -     phentermine (ADIPEX-P) 37.5 MG tablet; TAKE 1 TABLET BY MOUTH DAILY BEFORE BREAKFAST   Treated with antiviral due to exposure and body aches this morning.   Reviewed labs b12, CBC this morning. Looks great. Stay on same dose.   Discussed arthralgia/fatigue. Could get ANA to look for possible autoimmune infections.   Will check immunoglobins for frequent infections.   Continue with weight loss. Add more exercise. Follow up in 3 months.

## 2018-06-09 NOTE — Patient Instructions (Signed)
Systemic Lupus Erythematosus, Adult  Systemic lupus erythematosus (SLE) is a long-term (chronic) disease that can affect many parts of the body. SLE is an autoimmune disease. With this type of disease, the body's defense system (immune system) mistakenly attacks healthy tissues. This can cause damage to the skin, joints, blood vessels, brain, kidneys, lungs, heart, and other internal organs. It causes pain, irritation, and inflammation.  What are the causes?  The cause of this condition is not known.  What increases the risk?  The following factors may make you more likely to develop this condition:  · Being female.  · Being of Asian, Hispanic, or African-American descent.  · Having a family history of the condition.  · Being exposed to tobacco smoke or smoking cigarettes.  · Having an infection with a virus, such as Epstein-Barr virus.  · Having a history of exposure to silica dust, metals, chemicals, mold or mildew, or insecticides.  · Using oral contraceptives or hormone replacement therapy.  What are the signs or symptoms?  This condition can affect almost any organ or system in the body. Symptoms of the condition depend on which organ or system is affected.  The most common symptoms include:  · Fever.  · Fatigue.  · Weight loss.  · Muscle aches.  · Joint pain.  · Skin rashes, especially over the nose and cheeks (butterfly rash) and after sun exposure.  Symptoms can come and go. A period of time when symptoms get worse or come back is called a flare. A period of time with no symptoms is called a remission.  How is this diagnosed?  This condition is diagnosed based on:  · Your symptoms.  · Your medical history.  · A physical exam.  You may also have tests, including:  · Blood tests.  · Urine tests.  · A chest X-ray.  You may be referred to an autoimmune disease specialist (rheumatologist).  How is this treated?  There is no cure for this condition, but treatment can help to control symptoms, prevent flares (keep  symptoms in remission), and prevent damage to the heart, lungs, kidneys, and other organs. Treatment will depend on what symptoms you are having and what organs or systems are affected. Treatment may involve taking a combination of medicines over time.  Common medicines used to treat this condition include:  · Antimalarial medicines to control symptoms, prevent flares, and protect against organ damage.  · Corticosteroids and NSAIDs to reduce inflammation.  · Medicines to weaken your immune system (immunosuppressants).  · Biologic response modifiers to reduce inflammation and damage.  Follow these instructions at home:  Eating and drinking  · Eat a heart-healthy diet. This may include:  ? Eating high-fiber foods, such as fresh fruits and vegetables, whole grains, and beans.  ? Eating heart-healthy fats (omega-3 fats), such as fish, flaxseed, and flaxseed oil.  ? Limiting foods that are high in saturated fat and cholesterol, such as processed and fried foods, fatty meat, and full-fat dairy.  ? Limiting how much salt (sodium) you eat.  · Include calcium and vitamin D in your diet. Good sources of calcium and vitamin D include:  ? Low-fat dairy products such as milk, yogurt, and cheese.  ? Certain fish, such as fresh or canned salmon, tuna, and sardines.  ? Products that have calcium and vitamin D added to them (fortified products), such as fortified cereals or juice.  Medicines  · Take over-the-counter and prescription medicines only as told by your health   care provider.  · Do not take any medicines that contain estrogen without first checking with your health care provider. Estrogen can trigger flares and may increase your risk for blood clots.  Lifestyle         · Stay active, as directed by your health care provider.  · Do not use any products that contain nicotine or tobacco, such as cigarettes and e-cigarettes. If you need help quitting, ask your health care provider.  · Protect your skin from the sun by applying  sunblock and wearing protective hats and clothing.  · Learn as much as you can about your condition and have a good support system in place. Support may come from family, friends, or a lupus support group.  General instructions  · Work closely with all of your health care providers to manage your condition.  · Stay up to date on all vaccines as directed by your health care provider.  · Keep all follow-up visits as told by your health care provider. This is important.  Contact a health care provider if:  · You have a fever.  · Your symptoms flare.  · You develop new symptoms.  · You have bloody, foamy, or coffee-colored urine.  · There are changes in your urination. For example, you urinate more often at night.  · You think that you may be depressed or have anxiety.  · You become pregnant or plan to become pregnant. Pregnancy in women with this condition is considered high risk.  Get help right away if:  · You have chest pain.  · You have trouble breathing.  · You have a seizure.  · You suddenly get a very bad headache.  · You suddenly develop facial or body weakness.  · You cannot speak.  · You cannot understand speech.  These symptoms may represent a serious problem that is an emergency. Do not wait to see if the symptoms will go away. Get medical help right away. Call your local emergency services (911 in the [place] Do not drive yourself to the hospital.  Summary  · Systemic lupus erythematosus (SLE) is a long-term disease that can affect many parts of the body.  · SLE is an autoimmune disease. That means your body's defense system (immune system) mistakenly attacks healthy tissues.  · There is no cure for this condition, but treatment can help to control symptoms, prevent flares, and prevent damage to your organs. Treatment may involve taking a combination of medicines over time.  This information is not intended to replace advice given to you by your health care provider. Make sure you discuss any questions you  have with your health care provider.  Document Released: 03/22/2002 Document Revised: 05/09/2017 Document Reviewed: 05/09/2017  Elsevier Interactive Patient Education © 2019 [employer]

## 2018-06-16 ENCOUNTER — Encounter: Payer: Self-pay | Admitting: Physician Assistant

## 2018-06-16 DIAGNOSIS — M2550 Pain in unspecified joint: Secondary | ICD-10-CM | POA: Insufficient documentation

## 2018-06-16 DIAGNOSIS — Z8619 Personal history of other infectious and parasitic diseases: Secondary | ICD-10-CM | POA: Insufficient documentation

## 2018-06-22 ENCOUNTER — Encounter: Payer: Self-pay | Admitting: Physician Assistant

## 2018-06-23 ENCOUNTER — Encounter: Payer: Self-pay | Admitting: Physician Assistant

## 2018-06-23 MED ORDER — HYDROCOD POLST-CPM POLST ER 10-8 MG/5ML PO SUER: 5.0000 mL | Freq: Two times a day (BID) | ORAL | 0 refills | Status: DC | PRN

## 2018-06-23 NOTE — Telephone Encounter (Signed)
Hx of cough syrup for cough.  ..PDMP reviewed during this encounter. No recent hydrocodone. Sent small quanity.

## 2018-06-30 ENCOUNTER — Encounter: Payer: Self-pay | Admitting: Physician Assistant

## 2018-06-30 MED ORDER — ALPRAZOLAM 0.5 MG PO TABS: 0.5000 mg | ORAL_TABLET | Freq: Two times a day (BID) | ORAL | 0 refills | Status: DC | PRN

## 2018-07-05 ENCOUNTER — Other Ambulatory Visit: Payer: Self-pay | Admitting: Physician Assistant

## 2018-07-05 DIAGNOSIS — F411 Generalized anxiety disorder: Secondary | ICD-10-CM

## 2018-07-05 DIAGNOSIS — F331 Major depressive disorder, recurrent, moderate: Secondary | ICD-10-CM

## 2018-07-15 ENCOUNTER — Other Ambulatory Visit: Payer: Self-pay | Admitting: Physician Assistant

## 2018-07-15 MED ORDER — ALPRAZOLAM 0.5 MG PO TABS: 0.5000 mg | ORAL_TABLET | Freq: Two times a day (BID) | ORAL | 1 refills | Status: DC | PRN

## 2018-07-15 NOTE — Telephone Encounter (Signed)
Pt advised.

## 2018-08-06 ENCOUNTER — Other Ambulatory Visit: Payer: Self-pay | Admitting: Physician Assistant

## 2018-08-06 DIAGNOSIS — F411 Generalized anxiety disorder: Secondary | ICD-10-CM

## 2018-08-06 DIAGNOSIS — F331 Major depressive disorder, recurrent, moderate: Secondary | ICD-10-CM

## 2018-08-06 NOTE — Telephone Encounter (Signed)
Left pt msg to schedule appt.

## 2018-09-02 ENCOUNTER — Other Ambulatory Visit: Payer: Self-pay | Admitting: Physician Assistant

## 2018-09-02 DIAGNOSIS — F411 Generalized anxiety disorder: Secondary | ICD-10-CM

## 2018-09-02 DIAGNOSIS — F331 Major depressive disorder, recurrent, moderate: Secondary | ICD-10-CM

## 2018-09-02 NOTE — Telephone Encounter (Signed)
Left message with information below and for patient to call us back to schedule an appointment. °

## 2018-09-02 NOTE — Telephone Encounter (Signed)
Please schedule for 77-month follow-up for virtual visit.  He is due for her follow-up.

## 2018-09-03 ENCOUNTER — Encounter: Payer: Self-pay | Admitting: Physician Assistant

## 2018-09-07 ENCOUNTER — Encounter: Payer: Self-pay | Admitting: Physician Assistant

## 2018-09-08 MED ORDER — ALPRAZOLAM 0.5 MG PO TABS: 0.5000 mg | ORAL_TABLET | Freq: Two times a day (BID) | ORAL | 2 refills | Status: DC | PRN

## 2018-10-23 DIAGNOSIS — H1132 Conjunctival hemorrhage, left eye: Secondary | ICD-10-CM | POA: Diagnosis not present

## 2018-11-02 ENCOUNTER — Encounter: Payer: Self-pay | Admitting: Physician Assistant

## 2018-11-03 ENCOUNTER — Other Ambulatory Visit: Payer: BLUE CROSS/BLUE SHIELD

## 2018-11-04 ENCOUNTER — Other Ambulatory Visit: Payer: BC Managed Care – PPO

## 2018-11-06 ENCOUNTER — Other Ambulatory Visit: Payer: BC Managed Care – PPO

## 2018-11-06 ENCOUNTER — Other Ambulatory Visit: Payer: Self-pay | Admitting: Physician Assistant

## 2018-11-09 ENCOUNTER — Encounter: Payer: Self-pay | Admitting: Physician Assistant

## 2018-11-09 DIAGNOSIS — E010 Iodine-deficiency related diffuse (endemic) goiter: Secondary | ICD-10-CM

## 2018-11-09 DIAGNOSIS — Z8639 Personal history of other endocrine, nutritional and metabolic disease: Secondary | ICD-10-CM

## 2018-11-10 ENCOUNTER — Other Ambulatory Visit: Payer: Self-pay

## 2018-11-10 ENCOUNTER — Ambulatory Visit (INDEPENDENT_AMBULATORY_CARE_PROVIDER_SITE_OTHER): Payer: BC Managed Care – PPO

## 2018-11-10 DIAGNOSIS — Z8639 Personal history of other endocrine, nutritional and metabolic disease: Secondary | ICD-10-CM | POA: Diagnosis not present

## 2018-11-10 DIAGNOSIS — R5383 Other fatigue: Secondary | ICD-10-CM

## 2018-11-10 DIAGNOSIS — E010 Iodine-deficiency related diffuse (endemic) goiter: Secondary | ICD-10-CM

## 2018-11-10 NOTE — Telephone Encounter (Signed)
No ok to order TSH and free T4 and Free T3.

## 2018-11-11 LAB — TSH+FREE T4: TSH W/REFLEX TO FT4: 1.49 mIU/L

## 2018-11-11 LAB — T3, FREE: T3, Free: 2.5 pg/mL (ref 2.3–4.2)

## 2018-11-11 NOTE — Telephone Encounter (Signed)
TSH looks perfect.  Free T3 normal range on lower side.

## 2018-11-11 NOTE — Progress Notes (Signed)
Great news. No nodules seen on ultrasound of thyroid gland.

## 2018-11-13 ENCOUNTER — Other Ambulatory Visit: Payer: Self-pay

## 2018-11-13 ENCOUNTER — Telehealth (INDEPENDENT_AMBULATORY_CARE_PROVIDER_SITE_OTHER): Payer: BC Managed Care – PPO | Admitting: Physician Assistant

## 2018-11-13 VITALS — BP 115/75 | Ht 65.0 in | Wt 194.0 lb

## 2018-11-13 DIAGNOSIS — F411 Generalized anxiety disorder: Secondary | ICD-10-CM

## 2018-11-13 DIAGNOSIS — Z9884 Bariatric surgery status: Secondary | ICD-10-CM | POA: Diagnosis not present

## 2018-11-13 DIAGNOSIS — Z6832 Body mass index (BMI) 32.0-32.9, adult: Secondary | ICD-10-CM

## 2018-11-13 DIAGNOSIS — F430 Acute stress reaction: Secondary | ICD-10-CM | POA: Diagnosis not present

## 2018-11-13 DIAGNOSIS — E66811 Obesity, class 1: Secondary | ICD-10-CM

## 2018-11-13 DIAGNOSIS — E6609 Other obesity due to excess calories: Secondary | ICD-10-CM

## 2018-11-13 DIAGNOSIS — F331 Major depressive disorder, recurrent, moderate: Secondary | ICD-10-CM

## 2018-11-13 MED ORDER — INSULIN PEN NEEDLE 31G X 6 MM MISC: 0 refills | Status: DC

## 2018-11-13 MED ORDER — ALPRAZOLAM 0.5 MG PO TABS: 0.5000 mg | ORAL_TABLET | Freq: Two times a day (BID) | ORAL | 2 refills | Status: DC | PRN

## 2018-11-13 MED ORDER — SAXENDA 18 MG/3ML ~~LOC~~ SOPN: 0.6000 mg | PEN_INJECTOR | Freq: Every day | SUBCUTANEOUS | 1 refills | Status: DC

## 2018-11-13 MED ORDER — DULOXETINE HCL 30 MG PO CPEP: 30.0000 mg | ORAL_CAPSULE | Freq: Two times a day (BID) | ORAL | 1 refills | Status: DC

## 2018-11-13 NOTE — Progress Notes (Signed)
Patient ID: Deanna GearBlair Quizon, female   DOB: [birth year], 38 y.o.   MRN: [id number] .[name].Virtual Visit via Video Note  I connected with Deanna Keller on 11/16/18 at  8:30 AM EDT by a video enabled telemedicine application and verified that I am speaking with the correct person using two identifiers.  Location: Patient: in car Provider: clinic   I discussed the limitations of evaluation and management by telemedicine and the availability of in person appointments. The patient expressed understanding and agreed to proceed.  History of Present Illness: Pt is a 38 yo female who presents to the clinic to go over TSH and thyroid results.   She feels like she has every symptoms. No energy, dry skin, inability to lose weight, cold intolerance, and feels like her thyroid is big to her.   She is most concern with her weight. She has had gastric bypass. She is down some from 199-194. She has been on phentermine and just not seeing great results. Her anxiety has also been really elevated. She is a Veterinary surgeonrealtor and VERY busy. She feels like she has to take xanax bid. She is on lexpro and does not feel like working. buspar has not helped at all. She is trying to be more active but does not have time. She feels like she does not eat "that much".   .. Active Ambulatory Problems    Diagnosis Date Noted  . History of alcoholism (HCC) 07/21/2013  . B12 deficiency 07/21/2013  . Iron deficiency 07/21/2013  . Depression 11/10/2013  . Elevated LFTs 11/10/2013  . Chronic migraine 11/10/2013  . Malabsorption 11/10/2013  . S/P gastric bypass 11/11/2013  . Vitamin D deficiency 11/11/2013  . Recovering alcoholic in remission (HCC) 06/16/2014  . Lumbar degenerative disc disease 06/20/2014  . Bariatric surgery status in pregnancy 02/10/2013  . Phlebectasia 10/21/2011  . Hypoglycemia following gastrointestinal surgery 04/12/2013  . Generalized seizure (HCC) 08/30/2014  . Left lumbar radiculitis 10/11/2014  . IUD check up  11/24/2014  . Postoperative blind loop syndrome 04/12/2013  . Generalized anxiety disorder 09/06/2015  . Acute bronchitis 12/26/2015  . Stress reaction 06/16/2017  . Weight gain 10/03/2017  . No energy 02/05/2018  . Thyromegaly 02/05/2018  . Acute bilateral low back pain with bilateral sciatica 02/05/2018  . Hx of thyroid nodule 02/08/2018  . Frequent infections 06/16/2018  . Arthralgia 06/16/2018   Resolved Ambulatory Problems    Diagnosis Date Noted  . Viral URI 12/20/2014  . Benzodiazepine misuse (HCC) 09/06/2015  . Class 2 obesity due to excess calories without serious comorbidity with body mass index (BMI) of 35.0 to 35.9 in adult 10/03/2017   Past Medical History:  Diagnosis Date  . Alcohol abuse   . Anxiety    Reviewed med, allergy, problem list.     Observations/Objective: No acute distress. Normal mood.  Normal breathing.   .. Today's Vitals   11/13/18 0817  BP: 115/75  Weight: 194 lb (88 kg)  Height: 5\' 5"  (1.651 m)   Body mass index is 32.28 kg/m.  .. [facility] screen [facility] 11/13/2018 02/04/2018 06/16/2017  Decreased Interest 0 0 1  Down, Depressed, Hopeless 1 0 1  PHQ - 2 Score 1 0 2  Altered sleeping 3 3 2   Tired, decreased energy 3 2 2   Change in appetite 1 1 3   Feeling bad or failure about yourself  0 0 1  Trouble concentrating 3 0 2  Moving slowly or fidgety/restless 1 0 0  Suicidal thoughts 0 0 0  PHQ-9 Score 12 6 12   Difficult doing work/chores Somewhat difficult Very difficult Very difficult  Some encounter information is confidential and restricted. Go to Review Flowsheets activity to see all data.    GAD 7 : Generalized Anxiety Score 11/13/2018 02/04/2018 06/16/2017  Nervous, Anxious, on Edge 3 2 3   Control/stop worrying 3 1 3   Worry too much - different things 3 1 3   Trouble relaxing 3 2 3   Restless 1 2 2   Easily annoyed or irritable 1 1 1   Afraid - awful might happen 1 0 1  Total GAD 7 Score 15 9 16   Anxiety Difficulty Somewhat  difficult Very difficult -  Some encounter information is confidential and restricted. Go to Review Flowsheets activity to see all data.       Assessment and Plan: Marland [name] KitchenTazia was seen today for results.  Diagnoses and all orders for this visit:  Generalized anxiety disorder -     ALPRAZolam (XANAX) 0.5 MG tablet; Take 1 tablet (0.5 mg total) by mouth 2 (two) times daily as needed for anxiety.  Stress reaction -     ALPRAZolam (XANAX) 0.5 MG tablet; Take 1 tablet (0.5 mg total) by mouth 2 (two) times daily as needed for anxiety.  Moderate episode of recurrent major depressive disorder (HCC) -     DULoxetine (CYMBALTA) 30 MG capsule; Take 1 capsule (30 mg total) by mouth 2 (two) times daily.  S/P gastric bypass  Class 1 obesity due to excess calories without serious comorbidity with body mass index (BMI) of 32.0 to 32.9 in adult -     Liraglutide -Weight Management (SAXENDA) 18 MG/3ML SOPN; Inject 0.6 mg into the skin daily. For one week then increase by .6mg  weekly until reaches 3mg  daily.  Please include ultra fine needles 39mm -     Insulin Pen Needle 31G X 6 MM MISC; Use once daily with saxenda daily.   Reassured TSH and thyroid u/s were normal.   1. Count calories. See what you are eating.  2. Stop phentermine could be causing you to be more anxious and not helping.  3. Exercise. Do something every day if not for physical health your mental health. Download an app.  4. Consider IF 16:8 and discussed with her.  5. Will start saxenda. Discussed SE. Discussed taper.   For anxiety. Did refill xanax. But switched lexapro to cymbalta. Stop buspar.   Follow up in 1 month.   [name].Spent 30 minutes with patient and greater than 50 percent of visit spent counseling patient regarding treatment plan.   Follow Up Instructions:    I discussed the assessment and treatment plan with the patient. The patient was provided an opportunity to ask questions and all were answered. The patient  agreed with the plan and demonstrated an understanding of the instructions.   The patient was advised to call back or seek an in-person evaluation if the symptoms worsen or if the condition fails to improve as anticipated.  I provided 35 minutes of non-face-to-face time during this encounter.    Iran Planas, PA-C

## 2018-11-13 NOTE — Progress Notes (Signed)
PHQ9-GAD7 completed.   Wants to discuss thyroid results and weight - feels like phentermine not working.

## 2018-11-16 ENCOUNTER — Encounter: Payer: Self-pay | Admitting: Physician Assistant

## 2018-11-28 ENCOUNTER — Other Ambulatory Visit: Payer: Self-pay | Admitting: Physician Assistant

## 2018-11-28 DIAGNOSIS — M5441 Lumbago with sciatica, right side: Secondary | ICD-10-CM

## 2018-12-01 ENCOUNTER — Encounter: Payer: Self-pay | Admitting: Physician Assistant

## 2018-12-01 DIAGNOSIS — R635 Abnormal weight gain: Secondary | ICD-10-CM

## 2018-12-01 DIAGNOSIS — Z9884 Bariatric surgery status: Secondary | ICD-10-CM

## 2018-12-01 DIAGNOSIS — Z6835 Body mass index (BMI) 35.0-35.9, adult: Secondary | ICD-10-CM

## 2018-12-01 DIAGNOSIS — E6609 Other obesity due to excess calories: Secondary | ICD-10-CM

## 2018-12-03 MED ORDER — NALTREXONE HCL 50 MG PO TABS: 25.0000 mg | ORAL_TABLET | Freq: Two times a day (BID) | ORAL | 0 refills | Status: DC

## 2018-12-08 ENCOUNTER — Other Ambulatory Visit: Payer: Self-pay | Admitting: Physician Assistant

## 2018-12-08 DIAGNOSIS — F331 Major depressive disorder, recurrent, moderate: Secondary | ICD-10-CM

## 2018-12-22 ENCOUNTER — Telehealth: Payer: Self-pay | Admitting: Physician Assistant

## 2018-12-22 DIAGNOSIS — R569 Unspecified convulsions: Secondary | ICD-10-CM

## 2018-12-22 NOTE — Telephone Encounter (Signed)
Spoke with patient.  She states had 2 seizures 4 years ago. Was checked out by neurology and everything was normal. Was told it was probably due to Tramadol. Lamictal 100 mg TID is for seizure prevention.   Event now happened Sunday around 4 pm. Husband witnessed. States seizure lasted about a minute. Recovery time 10 minutes after. She did lose consciousness. She did get sick to stomach and have vomiting. No injuries. She states she did miss a couple full days of Lamictal last week but has taken it the past couple days.   They are out of town until Thursday evening.   Please advise.

## 2018-12-22 NOTE — Telephone Encounter (Signed)
I don't see any other medications on her list that should have caused this. Certainly lamictal doses missed could have lowered the seizure threshold and perhaps some external factors could have contributed. Make sure to stay hydrated. If happens again go to ER. When get home need neurology appt. Ok to make referral. Does she want to go to same neurologist? If having any other neurological issues such as vision changes, headaches, upper/lower extremity strength changes go to ED.

## 2018-12-22 NOTE — Telephone Encounter (Signed)
Should she stay off naltrexone?

## 2018-12-22 NOTE — Telephone Encounter (Signed)
Patient sent this through My Chart Appts:  I had a seizure Today, I take 300 mg of Keppra to prevent seizures because I had them 4 years ago, and none since until today.  also now on the naltrexone for weight loss, which I think causes seizures. I havent had a seizure in 4 years and saw neurologist then-everything was fine. I was taking tramodol then which I believe causes seizures too, it would happen right after I would take it.  I am out of town with my husband until Thursday Let me know what i should do in the meantime

## 2018-12-23 NOTE — Telephone Encounter (Signed)
Naltrexone can actually be a treatment for seizures so I do not think that had anything to do with it.

## 2018-12-24 NOTE — Telephone Encounter (Signed)
Patient made aware of advice. She will discuss further at her appt tomorrow. Will go ahead and place referral to Neurology. She has no preference, she states whoever can see her the quickest.

## 2018-12-24 NOTE — Addendum Note (Signed)
Addended byAnnamaria Helling on: 12/24/2018 08:59 AM   Modules accepted: Orders

## 2018-12-25 ENCOUNTER — Encounter: Payer: Self-pay | Admitting: Physician Assistant

## 2018-12-25 ENCOUNTER — Other Ambulatory Visit: Payer: Self-pay

## 2018-12-25 ENCOUNTER — Ambulatory Visit (INDEPENDENT_AMBULATORY_CARE_PROVIDER_SITE_OTHER): Payer: BC Managed Care – PPO | Admitting: Physician Assistant

## 2018-12-25 VITALS — BP 124/64 | HR 106 | Temp 98.5°F | Ht 65.0 in | Wt 197.0 lb

## 2018-12-25 DIAGNOSIS — R569 Unspecified convulsions: Secondary | ICD-10-CM | POA: Diagnosis not present

## 2018-12-25 DIAGNOSIS — Z13220 Encounter for screening for lipoid disorders: Secondary | ICD-10-CM | POA: Diagnosis not present

## 2018-12-25 DIAGNOSIS — F418 Other specified anxiety disorders: Secondary | ICD-10-CM

## 2018-12-25 DIAGNOSIS — R000 Tachycardia, unspecified: Secondary | ICD-10-CM

## 2018-12-25 DIAGNOSIS — G4700 Insomnia, unspecified: Secondary | ICD-10-CM

## 2018-12-25 MED ORDER — ZOLPIDEM TARTRATE 5 MG PO TABS: 5.0000 mg | ORAL_TABLET | Freq: Every evening | ORAL | 1 refills | Status: DC | PRN

## 2018-12-25 MED ORDER — LAMOTRIGINE 100 MG PO TABS: 100.0000 mg | ORAL_TABLET | Freq: Every day | ORAL | 0 refills | Status: DC

## 2018-12-25 NOTE — Patient Instructions (Addendum)
Ask neurologist about cymbalta.  STOP wellbutrin.  Increase lamictal.  Get labs today.   Seizure, Adult A seizure is a sudden burst of abnormal electrical activity in the brain. Seizures usually last from 30 seconds to 2 minutes. The abnormal activity temporarily interrupts normal brain function. A seizure can cause many different symptoms depending on where in the brain it starts. What are the causes? Common causes of this condition include:  Fever or infection.  Brain abnormality, injury, bleeding, or tumor.  Low blood sugar.  Metabolic disorders or other conditions that are passed from parent to child (are inherited).  Reaction to a substance, such as a drug or a medicine, or suddenly stopping the use of a substance (withdrawal).  Stroke.  Developmental disorders such as autism or cerebral palsy. In some cases, the cause of this condition may not be known. Some people who have a seizure never have another one. Seizures usually do not cause brain damage or permanent problems unless they are prolonged. A person who has repeated seizures over time without a clear cause has a condition called epilepsy. What increases the risk? You are more likely to develop this condition if you have:  A family history of epilepsy.  Had a tonic-clonic seizure in the past. This is a type of seizure that involves whole-body contraction of muscles and a loss of consciousness.  Autism, cerebral palsy, or other brain disorders.  A history of head trauma, lack of oxygen at birth, or strokes. What are the signs or symptoms? There are many different types of seizures. The symptoms of a seizure vary depending on the type of seizure you have. Examples of symptoms during a seizure include:  Uncontrollable shaking (convulsions).  Stiffening of the body.  Loss of consciousness.  Head nodding.  Staring.  Not responding to sound or touch.  Loss of bladder or bowel control. Some people have symptoms  right before a seizure happens (aura) and right after a seizure happens (postictal). Symptoms before a seizure may include:  Fear or anxiety.  Nausea.  Feeling like the room is spinning (vertigo).  A feeling of having seen or heard something before (dj vu).  Odd tastes or smells.  Changes in vision, such as seeing flashing lights or spots. Symptoms after a seizure may include:  Confusion.  Sleepiness.  Headache.  Weakness on one side of the body. How is this diagnosed? This condition may be diagnosed based on:  A description of your symptoms. Video of your seizures can be helpful.  Your medical history.  A physical exam. You may also have tests, including:  Blood tests.  CT scan.  MRI.  Electroencephalogram (EEG). This test measures electrical activity in the brain. An EEG can predict whether seizures will return (recur).  A spinal tap (also called a lumbar puncture). This is the removal and testing of fluid that surrounds the brain and spinal cord. How is this treated? Most seizures will stop on their own in under 5 minutes, and no treatment is needed. Seizures that last longer than 5 minutes will usually need treatment. Treatment can include:  Medicines given through an IV.  Avoiding known triggers, such as medicines that you take for another condition.  Medicines to treat epilepsy (antiepileptics), if epilepsy caused your seizures.  Surgery to stop seizures, if you have epilepsy that does not respond to medicines. Follow these instructions at home: Medicines  Take over-the-counter and prescription medicines only as told by your health care provider.  Avoid any substances  that may prevent your medicine from working properly, such as alcohol. Activity  Do not drive, swim, or do any other activities that would be dangerous if you had another seizure. Wait until your health care provider says it is safe to do them.  If you live in the [place] check with  your local DMV (department of motor vehicles) to find out about local driving laws. Each state has specific rules about when you can legally return to driving.  Get enough rest. Lack of sleep can make seizures more likely to occur. Educating others Teach friends and family what to do if you have a seizure. They should:  Lay you on the ground to prevent a fall.  Cushion your head and body.  Loosen any tight clothing around your neck.  Turn you on your side. If vomiting occurs, this helps keep your airway clear.  Not hold you down. Holding you down will not stop the seizure.  Not put anything into your mouth.  Know whether or not you need emergency care. For example, they should get help right away if you have a seizure that lasts longer than 5 minutes or have several seizures in a row.  Stay with you until you recover.  General instructions  Contact your health care provider each time you have a seizure.  Avoid anything that has ever triggered a seizure for you.  Keep a seizure diary. Record what you remember about each seizure, especially anything that might have triggered the seizure.  Keep all follow-up visits as told by your health care provider. This is important. Contact a health care provider if:  You have another seizure.  You have seizures more often.  Your seizure symptoms change.  You continue to have seizures with treatment.  You have symptoms of an infection or illness. This might increase your risk of having a seizure. Get help right away if:  You have a seizure that: ? Lasts longer than 5 minutes. ? Is different than previous seizures. ? Leaves you unable to speak or use a part of your body. ? Makes it harder to breathe.  You have: ? A seizure after a head injury. ? Multiple seizures in a row. ? Confusion or a severe headache right after a seizure.  You do not wake up immediately after a seizure.  You injure yourself during a seizure. These  symptoms may represent a serious problem that is an emergency. Do not wait to see if the symptoms will go away. Get medical help right away. Call your local emergency services (911 in the [place] Do not drive yourself to the hospital. Summary  Seizures are caused by abnormal electrical activity in the brain. The activity disrupts normal brain function and can cause various symptoms, such as convulsions, abnormal movements, or a change in consciousness.  There are many causes of seizures, including illnesses, medicines, genetic conditions, head injuries, strokes, tumors, substance abuse, or substance withdrawal.  Most seizures will stop on their own in under 5 minutes. Seizures that last longer than 5 minutes are a medical emergency and require immediate treatment.  Many medicines are used to treat seizures. Take over-the-counter and prescription medicines only as told by your health care provider. This information is not intended to replace advice given to you by your health care provider. Make sure you discuss any questions you have with your health care provider. Document Released: 03/29/2000 Document Revised: 06/19/2018 Document Reviewed: 06/19/2018 Elsevier Patient Education  [place].

## 2018-12-25 NOTE — Progress Notes (Signed)
Subjective:    Patient ID: Deanna Keller, female    DOB: 03-25-1981, 38 y.o.   MRN: [id number]  HPI  Patient is a 38 year old female with a one-time history of seizure suspected due to tramadol, generalized anxiety disorder, Depression, and hx of alcohol abuse presents to the clinic with husband to discuss recent seizure.  This would only be a second seizure patient has had. the first seizure was 4 years ago and suspected due to tramadol. Her husband witnessed this.  Happen on Sunday, September 6 while they were driving at the beach.  Patient was on the computer and working on some bills that documents.  Has been noticed patient immediately started seizing for a few minutes and then came to.  Patient has no awareness of what happened and she did lose consciousness. She did not have any loss of bladder control.  When she woke up she vomited and was dazed.  The whole recovery took about 10 minutes.  However she did not feel well for the next 24 to 48 hours.  She did admit to drinking 2 cups of coffee that morning but that is not uncommon.  She is on Lamictal and admits to missing doses randomly the week before but she did take that morning.  She has been working on weight loss and taking naltrexone. She has tried numerous different medications. She could not afford saxenda.   She also admits that she restarted Wellbutrin as well about 1 month ago to simulate the effects of contrave. She is on cymbalta for anxiety and depression. Her job is really stressfull and she is working all the time. This scared her a lot and very concerned. She is not sleeping. She takes vistaril as needed for sleep at night.  She feels ok today.   .. Active Ambulatory Problems    Diagnosis Date Noted  . History of alcoholism (HCC) 07/21/2013  . B12 deficiency 07/21/2013  . Iron deficiency 07/21/2013  . Depression 11/10/2013  . Elevated LFTs 11/10/2013  . Chronic migraine 11/10/2013  . Malabsorption 11/10/2013  . S/P gastric  bypass 11/11/2013  . Vitamin D deficiency 11/11/2013  . Recovering alcoholic in remission (HCC) 06/16/2014  . Lumbar degenerative disc disease 06/20/2014  . Bariatric surgery status in pregnancy 02/10/2013  . Phlebectasia 10/21/2011  . Hypoglycemia following gastrointestinal surgery 04/12/2013  . Generalized seizure (HCC) 08/30/2014  . Left lumbar radiculitis 10/11/2014  . IUD check up 11/24/2014  . Postoperative blind loop syndrome 04/12/2013  . Generalized anxiety disorder 09/06/2015  . Acute bronchitis 12/26/2015  . Stress reaction 06/16/2017  . Weight gain 10/03/2017  . No energy 02/05/2018  . Thyromegaly 02/05/2018  . Acute bilateral low back pain with bilateral sciatica 02/05/2018  . Hx of thyroid nodule 02/08/2018  . Frequent infections 06/16/2018  . Arthralgia 06/16/2018  . Tachycardia 12/28/2018  . Anxiety about health 12/28/2018   Resolved Ambulatory Problems    Diagnosis Date Noted  . Viral URI 12/20/2014  . Benzodiazepine misuse (HCC) 09/06/2015  . Class 2 obesity due to excess calories without serious comorbidity with body mass index (BMI) of 35.0 to 35.9 in adult 10/03/2017   Past Medical History:  Diagnosis Date  . Alcohol abuse   . Anxiety       Review of Systems See HPI.     Objective:   Physical Exam Vitals signs reviewed.  Constitutional:      Appearance: Normal appearance.  HENT:     Head: Normocephalic.  Eyes:  Extraocular Movements: Extraocular movements intact.     Pupils: Pupils are equal, round, and reactive to light.  Cardiovascular:     Rate and Rhythm: Normal rate and regular rhythm.     Pulses: Normal pulses.  Pulmonary:     Effort: Pulmonary effort is normal.     Breath sounds: Normal breath sounds.  Neurological:     General: No focal deficit present.     Mental Status: She is alert and oriented to person, place, and time.     Cranial Nerves: No cranial nerve deficit.     Sensory: No sensory deficit.     Motor: No  weakness.     Coordination: Coordination normal.     Gait: Gait normal.     Deep Tendon Reflexes: Reflexes normal.  Psychiatric:     Comments: Anxious.            Assessment & Plan:  Deanna Keller [name] KitchenAhtziri was seen today for seizures.  Diagnoses and all orders for this visit:  Seizure ([place]) -     COMPLETE METABOLIC PANEL WITH GFR -     TSH -     CBC with Differential/Platelet -     CK -     Prolactin -     lamoTRIgine (LAMICTAL) 100 MG tablet; Take 1 tablet (100 mg total) by mouth daily. Take 2 tablets in morning and 2 tablets in afternoon.  Screening for lipid disorders -     Lipid Panel w/reflex Direct LDL  Tachycardia  Anxiety about health  Insomnia, unspecified type -     zolpidem (AMBIEN) 5 MG tablet; Take 1 tablet (5 mg total) by mouth at bedtime as needed for sleep.   I am really concerned this was a medication induced seizure.  Despite previously being told not to take Wellbutrin due to increase seizure risk in the past during a phone message I approved her to restart Wellbutrin with naltrexone for weight loss.  She is also on Cymbalta for anxiety and depression.  I certainly think that could been enough to decrease her seizure threshold.  Discussed this with patient and told to stop Wellbutrin.  I would like for her to continue Cymbalta because it has done well to manage her anxiety.  I did review the rest of her med list and hydroxyzine also has a slight seizure risk.  Check this off her med list to take as needed.  She is not sleeping and went ahead and gave her a 5 mg dose of Ambien to take at bedtime for sleep this has a lower seizure risk than hydroxyzine.Certainly her sleep habits and stress could be affecting and/or triggering some seizure activity.  She does have a neurology appointment on Monday.  I did go ahead and increase her Lamictal today.  I will asked neurologist about Cymbalta.  Screening labs ordered today.  Discussed with patient after seizure she is not  recommended to drive for at least 6 months.  Deanna Keller.Spent 30 minutes with patient and greater than 50 percent of visit spent counseling patient regarding treatment plan.

## 2018-12-26 ENCOUNTER — Telehealth: Payer: Self-pay | Admitting: Diagnostic Neuroimaging

## 2018-12-26 LAB — CBC WITH DIFFERENTIAL/PLATELET
Absolute Monocytes: 368 cells/uL (ref 200–950)
Basophils Absolute: 39 cells/uL (ref 0–200)
Basophils Relative: 1.1 %
Eosinophils Absolute: 287 cells/uL (ref 15–500)
Eosinophils Relative: 8.2 %
HCT: 40.7 % (ref 35.0–45.0)
Hemoglobin: 13.4 g/dL (ref 11.7–15.5)
Lymphs Abs: 1106 cells/uL (ref 850–3900)
MCH: 30.8 pg (ref 27.0–33.0)
MCHC: 32.9 g/dL (ref 32.0–36.0)
MCV: 93.6 fL (ref 80.0–100.0)
MPV: 9.8 fL (ref 7.5–12.5)
Monocytes Relative: 10.5 %
Neutro Abs: 1701 cells/uL (ref 1500–7800)
Neutrophils Relative %: 48.6 %
Platelets: 323 10*3/uL (ref 140–400)
RBC: 4.35 10*6/uL (ref 3.80–5.10)
RDW: 11.9 % (ref 11.0–15.0)
Total Lymphocyte: 31.6 %
WBC: 3.5 10*3/uL — ABNORMAL LOW (ref 3.8–10.8)

## 2018-12-26 LAB — CK: Total CK: 96 U/L (ref 29–143)

## 2018-12-26 LAB — COMPLETE METABOLIC PANEL WITH GFR
AG Ratio: 2.1 (calc) (ref 1.0–2.5)
ALT: 55 U/L — ABNORMAL HIGH (ref 6–29)
AST: 40 U/L — ABNORMAL HIGH (ref 10–30)
Albumin: 4.4 g/dL (ref 3.6–5.1)
Alkaline phosphatase (APISO): 76 U/L (ref 31–125)
BUN: 11 mg/dL (ref 7–25)
CO2: 30 mmol/L (ref 20–32)
Calcium: 9.5 mg/dL (ref 8.6–10.2)
Chloride: 103 mmol/L (ref 98–110)
Creat: 0.68 mg/dL (ref 0.50–1.10)
GFR, Est African American: 130 mL/min/{1.73_m2} (ref >=60)
GFR, Est Non African American: 113 mL/min/{1.73_m2} (ref >=60)
Globulin: 2.1 g/dL (calc) (ref 1.9–3.7)
Glucose, Bld: 91 mg/dL (ref 65–99)
Potassium: 4.9 mmol/L (ref 3.5–5.3)
Sodium: 143 mmol/L (ref 135–146)
Total Bilirubin: 0.5 mg/dL (ref 0.2–1.2)
Total Protein: 6.5 g/dL (ref 6.1–8.1)

## 2018-12-26 LAB — TSH: TSH: 1.63 mIU/L

## 2018-12-26 LAB — LIPID PANEL W/REFLEX DIRECT LDL
Cholesterol: 207 mg/dL — ABNORMAL HIGH (ref <200)
HDL: 85 mg/dL (ref >=50)
LDL Cholesterol (Calc): 107 mg/dL (calc) — ABNORMAL HIGH
Non-HDL Cholesterol (Calc): 122 mg/dL (calc) (ref <130)
Total CHOL/HDL Ratio: 2.4 (calc) (ref <5.0)
Triglycerides: 65 mg/dL (ref <150)

## 2018-12-26 LAB — PROLACTIN: Prolactin: 4.8 ng/mL

## 2018-12-26 NOTE — Telephone Encounter (Signed)
Patient called in, has appointment on Monday with Dr. Jannifer Franklin. She is asking whether to change any of her meds (such as cymbalta or naltrexone) in advance of her consult visit. I recommend her to continue current meds until she is evaluated in the office.  Penni Bombard, MD 08/30/6158, 73:71 AM Certified in Neurology, Neurophysiology and Neuroimaging  [facility] [address] [place], [place] [address] [phone]

## 2018-12-27 ENCOUNTER — Encounter: Payer: Self-pay | Admitting: Physician Assistant

## 2018-12-28 ENCOUNTER — Other Ambulatory Visit: Payer: Self-pay

## 2018-12-28 ENCOUNTER — Telehealth: Payer: Self-pay | Admitting: Neurology

## 2018-12-28 ENCOUNTER — Ambulatory Visit (INDEPENDENT_AMBULATORY_CARE_PROVIDER_SITE_OTHER): Payer: BC Managed Care – PPO | Admitting: Neurology

## 2018-12-28 ENCOUNTER — Encounter: Payer: Self-pay | Admitting: Neurology

## 2018-12-28 ENCOUNTER — Encounter: Payer: Self-pay | Admitting: Physician Assistant

## 2018-12-28 VITALS — BP 106/70 | HR 89 | Temp 97.8°F | Ht 65.0 in | Wt 201.5 lb

## 2018-12-28 DIAGNOSIS — G40909 Epilepsy, unspecified, not intractable, without status epilepticus: Secondary | ICD-10-CM | POA: Diagnosis not present

## 2018-12-28 DIAGNOSIS — F418 Other specified anxiety disorders: Secondary | ICD-10-CM | POA: Insufficient documentation

## 2018-12-28 DIAGNOSIS — R569 Unspecified convulsions: Secondary | ICD-10-CM | POA: Insufficient documentation

## 2018-12-28 DIAGNOSIS — R000 Tachycardia, unspecified: Secondary | ICD-10-CM | POA: Insufficient documentation

## 2018-12-28 DIAGNOSIS — G4700 Insomnia, unspecified: Secondary | ICD-10-CM | POA: Insufficient documentation

## 2018-12-28 HISTORY — DX: Epilepsy, unspecified, not intractable, without status epilepticus: G40.909

## 2018-12-28 MED ORDER — LAMOTRIGINE 100 MG PO TABS: 200.0000 mg | ORAL_TABLET | Freq: Two times a day (BID) | ORAL | 0 refills | Status: DC

## 2018-12-28 NOTE — Telephone Encounter (Signed)
Called patient to let her know about lab results. She states she saw Neurologist today. They are doing EEG and told her no driving for 6 months.   She was concerned because Neurologist said Naltrexone is for alcohol dependency and won't work as a weight loss medication without the Wellbutrin and she wanted to run this by [employer].   Please advise.

## 2018-12-28 NOTE — Progress Notes (Signed)
Reason for visit: Seizures  Referring physician: Dr. Sherol Dade is a 38 y.o. female  History of present illness:  Ms. Malicoat is a 38 year old right-handed white female with a history of seizures 4 years ago while taking Ultram.  She had 2 seizures at that time, she was placed on Lamictal, she claims she had an EEG study and she believes she had an MRI but she is not sure, the records support that she had a noncontrast CT scan of the head for a concussion in 2013.  The patient was placed on Lamictal, she has been on 300 mg a day of this medication since that time but she does not always take the medication properly, she misses doses frequently.  She was placed on Wellbutrin for weight loss, 8 days ago she suffered another seizure, she was in the car with her husband who was driving and the patient had a witnessed event.  The patient started with a confusional event, she was talking nonsense and then went into a stiffening event, she did not bite her tongue or lose control of bowels or the bladder.  The event lasted about 1 minute.  The patient had no recollection of events during that time.  She has been increased on the Lamictal taking 200 mg twice daily.  She was taken off of the Wellbutrin.  She is sent to this office for an evaluation.  She denies any headaches, dizziness, vision changes, or any numbness or weakness of the face, arms, legs.  She denies any balance problems.  She does have a prior history of alcohol abuse but she has not had anything to drink in 8 years.  She denies any illicit drug use such as cocaine or marijuana.  She does use Xanax in low-dose, taking 0.5 mg twice daily.  The patient does report chronic fatigue issues and daytime drowsiness.  Past Medical History:  Diagnosis Date  . Alcohol abuse    sober since 2011  . Anxiety     Past Surgical History:  Procedure Laterality Date  . CERVICAL CONE BIOPSY    . GASTRIC BYPASS  2005    Family History   Problem Relation Age of Onset  . Colon cancer Mother   . Depression Mother   . Hypertension Father     Social history:  reports that she has been smoking e-cigarettes. She started smoking about 6 years ago. She has a 5.00 pack-year smoking history. She has never used smokeless tobacco. She reports that she does not drink alcohol or use drugs.  Medications:  Prior to Admission medications   Medication Sig Start Date End Date Taking? Authorizing Provider  ALPRAZolam Duanne Moron) 0.5 MG tablet Take 1 tablet (0.5 mg total) by mouth 2 (two) times daily as needed for anxiety. 11/13/18   Breeback, Jade L, PA-C  Cyanocobalamin 1000 MCG/ML KIT Inject 1 mL every two weeks. 06/09/18   Breeback, Jade L, PA-C  cyclobenzaprine (FLEXERIL) 10 MG tablet TAKE 1 TABLET BY MOUTH EVERYDAY AT BEDTIME 11/30/18   Breeback, Jade L, PA-C  DULoxetine (CYMBALTA) 30 MG capsule TAKE 1 CAPSULE (30 MG TOTAL) BY MOUTH 2 (TWO) TIMES DAILY. 12/08/18   Breeback, Jade L, PA-C  Insulin Pen Needle 31G X 6 MM MISC Use once daily with saxenda daily. 11/13/18   Breeback, Royetta Car, PA-C  lamoTRIgine (LAMICTAL) 100 MG tablet Take 1 tablet (100 mg total) by mouth daily. Take 2 tablets in morning and 2 tablets in afternoon. 12/25/18  Breeback, Jade L, PA-C  naltrexone (DEPADE) 50 MG tablet Take 0.5 tablets (25 mg total) by mouth 2 (two) times daily. 12/03/18   Breeback, Jade L, PA-C  zolpidem (AMBIEN) 5 MG tablet Take 1 tablet (5 mg total) by mouth at bedtime as needed for sleep. 12/25/18   Breeback, Royetta Car, PA-C  pramipexole (MIRAPEX) 0.25 MG tablet Take 1 tablet (0.25 mg total) by mouth at bedtime. 01/17/17 02/14/17  Merian Capron, MD  propranolol (INDERAL) 20 MG tablet Take 20 mg by mouth as needed.  12/23/16  [provider]  traZODone (DESYREL) 100 MG tablet Take 1 tablet (100 mg total) by mouth at bedtime. Patient taking differently: Take 100 mg by mouth at bedtime as needed.  10/03/16 01/17/17  Donella Stade, PA-C      Allergies   Allergen Reactions  . Tramadol Other (See Comments)    Possible seizure  . Augmentin [Amoxicillin-Pot Clavulanate]     vomiting  . Wellbutrin [Bupropion]     seizure    ROS:  Out of a complete 14 system review of symptoms, the patient complains only of the following symptoms, and all other reviewed systems are negative.  Fatigue, drowsiness Seizure  Blood pressure 106/70, pulse 89, temperature 97.8 F (36.6 C), temperature source Temporal, height 5' 5"  (1.651 m), weight 201 lb 8 oz (91.4 kg), SpO2 97 %.  Physical Exam  General: The patient is alert and cooperative at the time of the examination.  The patient is moderately obese.  Eyes: Pupils are equal, round, and reactive to light. Discs are flat bilaterally.  Neck: The neck is supple, no carotid bruits are noted.  Respiratory: The respiratory examination is clear.  Cardiovascular: The cardiovascular examination reveals a regular rate and rhythm, no obvious murmurs or rubs are noted.  Skin: Extremities are without significant edema.  Neurologic Exam  Mental status: The patient is alert and oriented x 3 at the time of the examination. The patient has apparent normal recent and remote memory, with an apparently normal attention span and concentration ability.  Cranial nerves: Facial symmetry is present. There is good sensation of the face to pinprick and soft touch bilaterally. The strength of the facial muscles and the muscles to head turning and shoulder shrug are normal bilaterally. Speech is well enunciated, no aphasia or dysarthria is noted. Extraocular movements are full. Visual fields are full. The tongue is midline, and the patient has symmetric elevation of the soft palate. No obvious hearing deficits are noted.  Motor: The motor testing reveals 5 over 5 strength of all 4 extremities. Good symmetric motor tone is noted throughout.  Sensory: Sensory testing is intact to pinprick, soft touch, vibration sensation, and  position sense on all 4 extremities. No evidence of extinction is noted.  Coordination: Cerebellar testing reveals good finger-nose-finger and heel-to-shin bilaterally.  Gait and station: Gait is normal. Tandem gait is normal. Romberg is negative. No drift is seen.  Reflexes: Deep tendon reflexes are symmetric and normal bilaterally. Toes are downgoing bilaterally.   Assessment/Plan:  1.  Seizures, recent recurrence  The patient has had a total of 3 seizures lifetime, the first 2 seizures occurred while using Ultram, the third seizure while using Wellbutrin, but the patient was on Lamictal when the third seizure occurred.  The patient is not to operate a motor vehicle for 6 months.  We will perform MRI of the brain with and without gadolinium enhancement, and get an EEG study.  We will bring her back  in 2 weeks to check the Lamictal level.  She will follow-up in 4 months.  We may consider an evaluation for a possible sleep disorder in the future if the fatigue issue continues.  The patient indicates that she allows her 20-year-old son to come into her bedroom at night and sleep with her, this may have a negative impact on her sleep quality.  Jill Alexanders MD 12/28/2018 2:39 PM  [facility] [address] [place], [place] [address]  Phone [phone] Fax [phone]

## 2018-12-28 NOTE — Progress Notes (Signed)
Call pt:  Thyroid perfect.  Liver enzymes just a hair elevated but fairly stable from the past.  Kidney and electrolytes great.  Cholesterol great. HDL fabulous.  WBC just a hair low. Could be fighting off a virus. No other abnormality from CBC.

## 2018-12-29 NOTE — Telephone Encounter (Signed)
Responded on mychart msg to her. FYI.

## 2018-12-30 ENCOUNTER — Telehealth: Payer: Self-pay | Admitting: Neurology

## 2018-12-30 NOTE — Telephone Encounter (Addendum)
Pt is asking for a call to discuss a 730 time slot she was told about on scheduled date of her MRI.  Pt states if that 730 slot is still available please put her in it and just leave a message stating the change was made

## 2018-12-30 NOTE — Telephone Encounter (Signed)
no to the covid questions MR Brain w/wo contrast Dr. Stephanie Acre Auth: [id number] (exp. 12/30/18 to 01/28/19). Patient is scheduled at [facility] for 01/06/19.

## 2018-12-30 NOTE — Telephone Encounter (Signed)
I switch her appt to the 7:30 slot for the same day 01/06/19. I left her a voicemail informing her that I moved her appt to the 7:30 slot. If she has any request to give [place] a call back.

## 2019-01-06 ENCOUNTER — Other Ambulatory Visit: Payer: Self-pay

## 2019-01-06 ENCOUNTER — Other Ambulatory Visit: Payer: BC Managed Care – PPO

## 2019-01-06 ENCOUNTER — Ambulatory Visit: Payer: BC Managed Care – PPO

## 2019-01-06 DIAGNOSIS — G40909 Epilepsy, unspecified, not intractable, without status epilepticus: Secondary | ICD-10-CM | POA: Diagnosis not present

## 2019-01-06 MED ORDER — GADOBENATE DIMEGLUMINE 529 MG/ML IV SOLN
19.0000 mL | Freq: Once | INTRAVENOUS | Status: AC | PRN
  Administered 2019-01-06: 19 mL via INTRAVENOUS

## 2019-01-07 ENCOUNTER — Encounter: Payer: Self-pay | Admitting: Physician Assistant

## 2019-01-07 ENCOUNTER — Telehealth: Payer: Self-pay | Admitting: Neurology

## 2019-01-07 DIAGNOSIS — G4700 Insomnia, unspecified: Secondary | ICD-10-CM

## 2019-01-07 DIAGNOSIS — Z5181 Encounter for therapeutic drug level monitoring: Secondary | ICD-10-CM

## 2019-01-07 NOTE — Telephone Encounter (Signed)
I called the patient.  The MRI of the brain is essentially normal, there may be 1 or 2 nonspecific punctate white matter changes.  The patient is to come in and get an EEG study, will check a Lamictal level next week.  She will stay on Lamictal for now.    MRI brain 01/07/19:  IMPRESSION: Slightly abnormal MRI scan of the brain showing few scattered supratentorial and right pontine nonspecific white matter hyperintensities likely it is due to small vessel disease.  No enhancing lesions are noted.

## 2019-01-08 MED ORDER — ZOLPIDEM TARTRATE 5 MG PO TABS: ORAL_TABLET | ORAL | 2 refills | Status: DC

## 2019-01-11 ENCOUNTER — Other Ambulatory Visit (INDEPENDENT_AMBULATORY_CARE_PROVIDER_SITE_OTHER): Payer: Self-pay

## 2019-01-11 ENCOUNTER — Other Ambulatory Visit: Payer: Self-pay

## 2019-01-11 DIAGNOSIS — Z0289 Encounter for other administrative examinations: Secondary | ICD-10-CM

## 2019-01-11 DIAGNOSIS — Z5181 Encounter for therapeutic drug level monitoring: Secondary | ICD-10-CM | POA: Diagnosis not present

## 2019-01-13 LAB — LAMOTRIGINE LEVEL: Lamotrigine Lvl: 10.1 ug/mL (ref 2.0–20.0)

## 2019-01-14 ENCOUNTER — Ambulatory Visit (INDEPENDENT_AMBULATORY_CARE_PROVIDER_SITE_OTHER): Payer: BC Managed Care – PPO | Admitting: Neurology

## 2019-01-14 ENCOUNTER — Telehealth: Payer: Self-pay | Admitting: Neurology

## 2019-01-14 ENCOUNTER — Other Ambulatory Visit: Payer: Self-pay

## 2019-01-14 DIAGNOSIS — G40909 Epilepsy, unspecified, not intractable, without status epilepticus: Secondary | ICD-10-CM

## 2019-01-14 NOTE — Telephone Encounter (Signed)
I called the patient.  EEG study was normal.  The patient will stay on Lamictal for now, try to avoid medications that lower the seizure threshold.

## 2019-01-14 NOTE — Procedures (Signed)
    History:  Deanna Keller is a 38 year old patient with a history of seizures that occurred 4 years ago while taking Ultram.  The patient recently was on Wellbutrin for weight loss, she had a seizure while on this medication.  The patient was on Lamictal at the time of the seizure event.  She is being evaluated for seizures.  This is a routine EEG.  No skull defects are noted.  Medications include alprazolam, vitamin B12, Flexeril, Cymbalta, insulin, Lamictal, naltrexone, Ambien, Mirapex, propranolol, and trazodone.  EEG classification: Normal awake  Description of the recording: The background rhythms of this recording consists of a fairly well modulated medium amplitude alpha rhythm of 10 Hz that is reactive to eye opening and closure. As the record progresses, the patient appears to remain in the waking state throughout the recording. Photic stimulation was performed, resulting in a bilateral and symmetric photic driving response. Hyperventilation was also performed, resulting in a minimal buildup of the background rhythm activities without significant slowing seen. At no time during the recording does there appear to be evidence of spike or spike wave discharges or evidence of focal slowing. EKG monitor shows no evidence of cardiac rhythm abnormalities with a heart rate of 84.  Impression: This is a normal EEG recording in the waking state. No evidence of ictal or interictal discharges are seen.

## 2019-01-18 ENCOUNTER — Encounter: Payer: Self-pay | Admitting: Physician Assistant

## 2019-01-18 ENCOUNTER — Other Ambulatory Visit: Payer: Self-pay | Admitting: Physician Assistant

## 2019-01-19 MED ORDER — BENZONATATE 200 MG PO CAPS: 200.0000 mg | ORAL_CAPSULE | Freq: Three times a day (TID) | ORAL | 0 refills | Status: DC | PRN

## 2019-01-19 NOTE — Addendum Note (Signed)
Addended by: Gregor Hams on: 01/19/2019 12:07 PM   Modules accepted: Orders

## 2019-02-11 ENCOUNTER — Other Ambulatory Visit: Payer: Self-pay | Admitting: Physician Assistant

## 2019-02-11 DIAGNOSIS — F331 Major depressive disorder, recurrent, moderate: Secondary | ICD-10-CM

## 2019-02-12 ENCOUNTER — Encounter: Payer: Self-pay | Admitting: Physician Assistant

## 2019-02-13 ENCOUNTER — Other Ambulatory Visit: Payer: Self-pay | Admitting: Physician Assistant

## 2019-02-13 DIAGNOSIS — F430 Acute stress reaction: Secondary | ICD-10-CM

## 2019-02-13 DIAGNOSIS — F411 Generalized anxiety disorder: Secondary | ICD-10-CM

## 2019-03-01 ENCOUNTER — Encounter: Payer: Self-pay | Admitting: Physician Assistant

## 2019-03-24 DIAGNOSIS — Z6833 Body mass index (BMI) 33.0-33.9, adult: Secondary | ICD-10-CM | POA: Diagnosis not present

## 2019-03-24 DIAGNOSIS — Z01419 Encounter for gynecological examination (general) (routine) without abnormal findings: Secondary | ICD-10-CM | POA: Diagnosis not present

## 2019-03-29 ENCOUNTER — Encounter: Payer: Self-pay | Admitting: Physician Assistant

## 2019-03-29 DIAGNOSIS — R21 Rash and other nonspecific skin eruption: Secondary | ICD-10-CM

## 2019-03-29 LAB — HM PAP SMEAR: HM Pap smear: NEGATIVE

## 2019-04-06 ENCOUNTER — Other Ambulatory Visit: Payer: Self-pay | Admitting: Physician Assistant

## 2019-04-06 DIAGNOSIS — R569 Unspecified convulsions: Secondary | ICD-10-CM

## 2019-04-20 DIAGNOSIS — Z20828 Contact with and (suspected) exposure to other viral communicable diseases: Secondary | ICD-10-CM | POA: Diagnosis not present

## 2019-04-26 ENCOUNTER — Other Ambulatory Visit: Payer: Self-pay | Admitting: Physician Assistant

## 2019-04-26 ENCOUNTER — Telehealth: Payer: Self-pay | Admitting: Neurology

## 2019-04-26 DIAGNOSIS — G4700 Insomnia, unspecified: Secondary | ICD-10-CM

## 2019-04-26 NOTE — Telephone Encounter (Signed)
Last filled 01/08/2019 #60 with 2 refills.

## 2019-04-26 NOTE — Telephone Encounter (Signed)
Pt called and stated she did not want to keep appt. She states everything is fine and she has had not seizures.

## 2019-04-26 NOTE — Telephone Encounter (Signed)
Noted, hopefully she will call if she has another seizure.

## 2019-04-29 ENCOUNTER — Ambulatory Visit: Payer: BC Managed Care – PPO | Admitting: Neurology

## 2019-05-04 ENCOUNTER — Other Ambulatory Visit: Payer: Self-pay | Admitting: Physician Assistant

## 2019-05-04 ENCOUNTER — Encounter: Payer: Self-pay | Admitting: Physician Assistant

## 2019-05-04 DIAGNOSIS — M5441 Lumbago with sciatica, right side: Secondary | ICD-10-CM

## 2019-05-04 DIAGNOSIS — M5442 Lumbago with sciatica, left side: Secondary | ICD-10-CM

## 2019-05-05 ENCOUNTER — Encounter: Payer: BC Managed Care – PPO | Admitting: Physician Assistant

## 2019-05-05 ENCOUNTER — Encounter: Payer: Self-pay | Admitting: Physician Assistant

## 2019-05-05 ENCOUNTER — Other Ambulatory Visit: Payer: Self-pay

## 2019-05-05 NOTE — Telephone Encounter (Signed)
Can you take her off for this morning so she is not charged.

## 2019-05-05 NOTE — Progress Notes (Signed)
This encounter was created in error - please disregard.

## 2019-05-07 ENCOUNTER — Other Ambulatory Visit: Payer: Self-pay | Admitting: Physician Assistant

## 2019-05-07 DIAGNOSIS — F331 Major depressive disorder, recurrent, moderate: Secondary | ICD-10-CM

## 2019-05-09 ENCOUNTER — Other Ambulatory Visit: Payer: Self-pay | Admitting: Physician Assistant

## 2019-05-09 ENCOUNTER — Other Ambulatory Visit: Payer: Self-pay | Admitting: Family Medicine

## 2019-05-09 DIAGNOSIS — F411 Generalized anxiety disorder: Secondary | ICD-10-CM

## 2019-05-09 DIAGNOSIS — F430 Acute stress reaction: Secondary | ICD-10-CM

## 2019-05-10 NOTE — Telephone Encounter (Signed)
Last filled 02/15/2019 #60 with 2 refills.  Last appt 12/25/2018.

## 2019-05-12 ENCOUNTER — Encounter: Payer: Self-pay | Admitting: Physician Assistant

## 2019-05-18 ENCOUNTER — Encounter: Payer: Self-pay | Admitting: Physician Assistant

## 2019-05-18 ENCOUNTER — Other Ambulatory Visit: Payer: Self-pay | Admitting: Physician Assistant

## 2019-05-18 DIAGNOSIS — F331 Major depressive disorder, recurrent, moderate: Secondary | ICD-10-CM

## 2019-05-18 DIAGNOSIS — F411 Generalized anxiety disorder: Secondary | ICD-10-CM

## 2019-05-30 ENCOUNTER — Encounter: Payer: Self-pay | Admitting: Physician Assistant

## 2019-06-08 ENCOUNTER — Encounter: Payer: Self-pay | Admitting: Physician Assistant

## 2019-06-24 ENCOUNTER — Encounter: Payer: Self-pay | Admitting: Physician Assistant

## 2019-07-07 ENCOUNTER — Other Ambulatory Visit: Payer: Self-pay | Admitting: Physician Assistant

## 2019-07-07 DIAGNOSIS — R569 Unspecified convulsions: Secondary | ICD-10-CM

## 2019-07-22 ENCOUNTER — Encounter: Payer: Self-pay | Admitting: Physician Assistant

## 2019-07-26 ENCOUNTER — Encounter: Payer: Self-pay | Admitting: Physician Assistant

## 2019-07-26 ENCOUNTER — Telehealth (INDEPENDENT_AMBULATORY_CARE_PROVIDER_SITE_OTHER): Payer: BC Managed Care – PPO | Admitting: Physician Assistant

## 2019-07-26 VITALS — Temp 97.3°F | Ht 65.0 in

## 2019-07-26 DIAGNOSIS — F331 Major depressive disorder, recurrent, moderate: Secondary | ICD-10-CM

## 2019-07-26 DIAGNOSIS — F430 Acute stress reaction: Secondary | ICD-10-CM

## 2019-07-26 DIAGNOSIS — F410 Panic disorder [episodic paroxysmal anxiety] without agoraphobia: Secondary | ICD-10-CM

## 2019-07-26 DIAGNOSIS — F411 Generalized anxiety disorder: Secondary | ICD-10-CM | POA: Diagnosis not present

## 2019-07-26 MED ORDER — ALPRAZOLAM 0.5 MG PO TABS: 0.5000 mg | ORAL_TABLET | Freq: Two times a day (BID) | ORAL | 2 refills | Status: DC | PRN

## 2019-07-26 MED ORDER — BUSPIRONE HCL 7.5 MG PO TABS: 7.5000 mg | ORAL_TABLET | Freq: Two times a day (BID) | ORAL | 1 refills | Status: DC

## 2019-07-26 NOTE — Progress Notes (Signed)
Patient wants to discuss mood meds PHQ9-GAD7 completed.

## 2019-07-26 NOTE — Progress Notes (Signed)
Patient ID: Deanna Keller, female   DOB: Mar 24, 1981, 39 y.o.   MRN: [id number] .Marland KitchenVirtual Visit via Video Note  I connected with [name] on 07/26/2019 at  9:30 AM EDT by a video enabled telemedicine application and verified that I am speaking with the correct person using two identifiers.  Location: Patient: home Provider: clinic   I discussed the limitations of evaluation and management by telemedicine and the availability of in person appointments. The patient expressed understanding and agreed to proceed.  History of Present Illness: Pt is a 39 yo female with anxiety and depression who calls into the clinic to discuss medications.  She feels like her anxiety has gotten much worse over the past 3 months.  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  She does feel like she is becoming dependent on Xanax.  She is taking on average twice a day but feels like she could take another time.  She does not want to stay dependent on Xanax.  She is also taking Cymbalta but she admits feels like her anxiety has worsened since she started Cymbalta.  Patient takes Lamictal for seizures.  .. Active Ambulatory Problems    Diagnosis Date Noted  . History of alcoholism (HCC) 07/21/2013  . B12 deficiency 07/21/2013  . Iron deficiency 07/21/2013  . Depression 11/10/2013  . Elevated LFTs 11/10/2013  . Chronic migraine 11/10/2013  . Malabsorption 11/10/2013  . S/P gastric bypass 11/11/2013  . Vitamin D deficiency 11/11/2013  . Recovering alcoholic in remission (HCC) 06/16/2014  . Lumbar degenerative disc disease 06/20/2014  . Bariatric surgery status in pregnancy 02/10/2013  . Phlebectasia 10/21/2011  . Hypoglycemia following gastrointestinal surgery 04/12/2013  . Generalized seizure (HCC) 08/30/2014  . Left lumbar radiculitis 10/11/2014  . IUD check up 11/24/2014  . Postoperative blind loop syndrome 04/12/2013  . Generalized anxiety disorder 09/06/2015  . Acute bronchitis  12/26/2015  . Stress reaction 06/16/2017  . Weight gain 10/03/2017  . No energy 02/05/2018  . Thyromegaly 02/05/2018  . Acute bilateral low back pain with bilateral sciatica 02/05/2018  . Hx of thyroid nodule 02/08/2018  . Frequent infections 06/16/2018  . Arthralgia 06/16/2018  . Tachycardia 12/28/2018  . Anxiety about health 12/28/2018  . Insomnia 12/28/2018  . Seizure disorder (HCC) 12/28/2018   Resolved Ambulatory Problems    Diagnosis Date Noted  . Viral URI 12/20/2014  . Benzodiazepine misuse (HCC) 09/06/2015  . Class 2 obesity due to excess calories without serious comorbidity with body mass index (BMI) of 35.0 to 35.9 in adult 10/03/2017   Past Medical History:  Diagnosis Date  . Alcohol abuse   . Anxiety    Reviewed med, allergy, problem list.    Observations/Objective: No acute distress Normal mood and appearance.  Anxious thoughts.   .. Today's Vitals   07/26/19 0919  Temp: (!) 97.3 F (36.3 C)  TempSrc: Oral  Height: 5\' 5"  (1.651 m)   Body mass index is 33.45 kg/m.   .. Depression screen [facility] 2/9 07/26/2019 11/13/2018 02/04/2018 06/16/2017  Decreased Interest 3 0 0 1  Down, Depressed, Hopeless 3 1 0 1  PHQ - 2 Score 6 1 0 2  Altered sleeping 3 3 3 2   Tired, decreased energy 3 3 2 2   Change in appetite 0 1 1 3   Feeling bad or failure about yourself  1 0 0 1  Trouble concentrating 3 3 0 2  Moving slowly or fidgety/restless 0 1 0 0  Suicidal thoughts  0 0 0 0  PHQ-9 Score 16 12 6 12   Difficult doing work/chores Extremely dIfficult Somewhat difficult Very difficult Very difficult  Some encounter information is confidential and restricted. Go to Review Flowsheets activity to see all data.   .. GAD 7 : Generalized Anxiety Score 07/26/2019 11/13/2018 02/04/2018 06/16/2017  Nervous, Anxious, on Edge 3 3 2 3   Control/stop worrying 3 3 1 3   Worry too much - different things 0 3 1 3   Trouble relaxing 3 3 2 3   Restless 3 1 2 2   Easily annoyed or irritable 3 1  1 1   Afraid - awful might happen 1 1 0 1  Total GAD 7 Score 16 15 9 16   Anxiety Difficulty Very difficult Somewhat difficult Very difficult -  Some encounter information is confidential and restricted. Go to Review Flowsheets activity to see all data.     Assessment and Plan: Marland [name] KitchenLinzy was seen today for depression.  Diagnoses and all orders for this visit:  Generalized anxiety disorder -     busPIRone (BUSPAR) 7.5 MG tablet; Take 1 tablet (7.5 mg total) by mouth 2 (two) times daily. -     ALPRAZolam (XANAX) 0.5 MG tablet; Take 1 tablet (0.5 mg total) by mouth 2 (two) times daily as needed for anxiety.  Stress reaction -     busPIRone (BUSPAR) 7.5 MG tablet; Take 1 tablet (7.5 mg total) by mouth 2 (two) times daily. -     ALPRAZolam (XANAX) 0.5 MG tablet; Take 1 tablet (0.5 mg total) by mouth 2 (two) times daily as needed for anxiety.  Panic attacks -     busPIRone (BUSPAR) 7.5 MG tablet; Take 1 tablet (7.5 mg total) by mouth 2 (two) times daily.  Moderate episode of recurrent major depressive disorder (HCC)   PHQ-9 and GAD-7 scores were very elevated today.  I am concerned that S SNRI could be causing anxiety to be worse.  We will cut Cymbalta down to 30 mg a day.  I would like to consider titrating off as SNRI and consider SSRI.  Patient is very concerned because she had a seizure when was put on Wellbutrin.  I reassured her that Wellbutrin is not an SSRI.  However it is in the adverse side effects to use caution with SSRIs and seizure.  We will hold on this for now.  I think if we did this slowly it would still be okay to try.  For now we will add BuSpar 7.5 mg twice a day.  Continue to use Xanax for now but did discuss dependency and how we need to start titrating off this.  Strongly encouraged regular daily exercise.  Strongly encouraged meditation/yoga daily.  Right now patient does not have time or money for counseling,   Follow up in 2 months.     Follow Up Instructions:     I discussed the assessment and treatment plan with the patient. The patient was provided an opportunity to ask questions and all were answered. The patient agreed with the plan and demonstrated an understanding of the instructions.   The patient was advised to call back or seek an in-person evaluation if the symptoms worsen or if the condition fails to improve as anticipated.  I provided 20 minutes of non-face-to-face time during this encounter.   Iran Planas, PA-C

## 2019-07-26 NOTE — Patient Instructions (Signed)
Decrease cymbalta once daily.  Add celexa 10mg  daily.  Add buspar 7.5mg  twice a day.

## 2019-07-28 ENCOUNTER — Encounter: Payer: Self-pay | Admitting: Physician Assistant

## 2019-07-31 ENCOUNTER — Other Ambulatory Visit: Payer: Self-pay | Admitting: Physician Assistant

## 2019-07-31 DIAGNOSIS — F331 Major depressive disorder, recurrent, moderate: Secondary | ICD-10-CM

## 2019-08-02 MED ORDER — CITALOPRAM HYDROBROMIDE 10 MG PO TABS: 10.0000 mg | ORAL_TABLET | Freq: Every day | ORAL | 1 refills | Status: DC

## 2019-08-04 NOTE — Telephone Encounter (Signed)
I received the approval letter yesterday for the medication. They denied it before they realized they had the requested information patient can fill at pharmacy. - CF

## 2019-08-07 ENCOUNTER — Encounter: Payer: Self-pay | Admitting: Physician Assistant

## 2019-08-10 DIAGNOSIS — N939 Abnormal uterine and vaginal bleeding, unspecified: Secondary | ICD-10-CM | POA: Diagnosis not present

## 2019-08-10 DIAGNOSIS — Z3202 Encounter for pregnancy test, result negative: Secondary | ICD-10-CM | POA: Diagnosis not present

## 2019-08-10 DIAGNOSIS — R102 Pelvic and perineal pain: Secondary | ICD-10-CM | POA: Diagnosis not present

## 2019-08-13 NOTE — Telephone Encounter (Signed)
Start celexa 10mg  one a day. OK to increase to 2 tab daily after 10 days if she is feeling well

## 2019-08-13 NOTE — Telephone Encounter (Signed)
I called patient and reminded her of Jade's directions on the medication.    Jomarie Longs, PA-C to [name]      5:12 PM Take 1/2 tablet of celexa with 1 full tablet of cymbalta for 7 days then stop cymbalta and go up to 1 full tablet of celexa.   Last read by [name] at 7:20 AM on 08/13/2019.

## 2019-08-18 ENCOUNTER — Other Ambulatory Visit: Payer: Self-pay | Admitting: Physician Assistant

## 2019-08-18 DIAGNOSIS — F410 Panic disorder [episodic paroxysmal anxiety] without agoraphobia: Secondary | ICD-10-CM

## 2019-08-18 DIAGNOSIS — F430 Acute stress reaction: Secondary | ICD-10-CM

## 2019-08-18 DIAGNOSIS — F411 Generalized anxiety disorder: Secondary | ICD-10-CM

## 2019-08-23 ENCOUNTER — Other Ambulatory Visit: Payer: Self-pay | Admitting: Physician Assistant

## 2019-08-23 DIAGNOSIS — R569 Unspecified convulsions: Secondary | ICD-10-CM

## 2019-08-25 ENCOUNTER — Other Ambulatory Visit: Payer: Self-pay | Admitting: Physician Assistant

## 2019-09-17 ENCOUNTER — Telehealth (INDEPENDENT_AMBULATORY_CARE_PROVIDER_SITE_OTHER): Payer: BC Managed Care – PPO | Admitting: Physician Assistant

## 2019-09-17 DIAGNOSIS — F430 Acute stress reaction: Secondary | ICD-10-CM

## 2019-09-17 DIAGNOSIS — F411 Generalized anxiety disorder: Secondary | ICD-10-CM | POA: Diagnosis not present

## 2019-09-17 DIAGNOSIS — G40909 Epilepsy, unspecified, not intractable, without status epilepticus: Secondary | ICD-10-CM | POA: Diagnosis not present

## 2019-09-17 DIAGNOSIS — F1021 Alcohol dependence, in remission: Secondary | ICD-10-CM | POA: Diagnosis not present

## 2019-09-17 DIAGNOSIS — F331 Major depressive disorder, recurrent, moderate: Secondary | ICD-10-CM

## 2019-09-17 MED ORDER — CITALOPRAM HYDROBROMIDE 20 MG PO TABS: 20.0000 mg | ORAL_TABLET | Freq: Every day | ORAL | 0 refills | Status: DC

## 2019-09-17 MED ORDER — BUSPIRONE HCL 10 MG PO TABS: 10.0000 mg | ORAL_TABLET | Freq: Three times a day (TID) | ORAL | 0 refills | Status: DC

## 2019-09-17 MED ORDER — ALPRAZOLAM 0.5 MG PO TABS: 0.5000 mg | ORAL_TABLET | Freq: Two times a day (BID) | ORAL | 2 refills | Status: DC | PRN

## 2019-09-17 NOTE — Progress Notes (Signed)
Patient ID: Deanna Keller, female   DOB: [birth year], 39 y.o.   MRN: [id number] .Marland KitchenVirtual Visit via Video Note  I connected with Deanna Keller on 09/17/19 at 10:10 AM EDT by a video enabled telemedicine application and verified that I am speaking with the correct person using two identifiers.  Location: Patient: home Provider: clinic   I discussed the limitations of evaluation and management by telemedicine and the availability of in person appointments. The patient expressed understanding and agreed to proceed.  History of Present Illness: Patient is a 39 year old female with seizure disorder, alcoholic in remission, history of gastric bypass, generalized anxiety, major depression who presents to the clinic to follow-up on anxiety.  She was seen last 07/26/2019 in clinic and was started on BuSpar and Celexa.  She remains on Lamictal and Xanax.  She feels like she has no real benefit with her anxiety.  She is still anxious all the times.  She still worries every day.  She still feels like bad things are going to happen.  She feels like she cannot get ahead of her worry.  She is trying to walk.  She has a very stressful job.  She is worried about increasing her medications due to her seizure disorder.  She is still taking Xanax twice a day. She continues to want to get off xanax twice a day.     Active Ambulatory Problems    Diagnosis Date Noted  . History of alcoholism (HCC) 07/21/2013  . B12 deficiency 07/21/2013  . Iron deficiency 07/21/2013  . Depression 11/10/2013  . Elevated LFTs 11/10/2013  . Chronic migraine 11/10/2013  . Malabsorption 11/10/2013  . S/P gastric bypass 11/11/2013  . Vitamin D deficiency 11/11/2013  . Recovering alcoholic in remission (HCC) 06/16/2014  . Lumbar degenerative disc disease 06/20/2014  . Bariatric surgery status in pregnancy 02/10/2013  . Phlebectasia 10/21/2011  . Hypoglycemia following gastrointestinal surgery 04/12/2013  . Generalized seizure (HCC)  08/30/2014  . Left lumbar radiculitis 10/11/2014  . IUD check up 11/24/2014  . Postoperative blind loop syndrome 04/12/2013  . Generalized anxiety disorder 09/06/2015  . Acute bronchitis 12/26/2015  . Stress reaction 06/16/2017  . Weight gain 10/03/2017  . No energy 02/05/2018  . Thyromegaly 02/05/2018  . Acute bilateral low back pain with bilateral sciatica 02/05/2018  . Hx of thyroid nodule 02/08/2018  . Frequent infections 06/16/2018  . Arthralgia 06/16/2018  . Tachycardia 12/28/2018  . Anxiety about health 12/28/2018  . Insomnia 12/28/2018  . Seizure disorder (HCC) 12/28/2018   Resolved Ambulatory Problems    Diagnosis Date Noted  . Viral URI 12/20/2014  . Benzodiazepine misuse (HCC) 09/06/2015  . Class 2 obesity due to excess calories without serious comorbidity with body mass index (BMI) of 35.0 to 35.9 in adult 10/03/2017   Past Medical History:  Diagnosis Date  . Alcohol abuse   . Anxiety    Reviewed med, allergy, problem list.     Observations/Objective: No acute distress Anxious.  [name].There were no vitals filed for this visit. There is no height or weight on file to calculate BMI.   .. Depression screen [facility] 2/9 09/17/2019 07/26/2019 11/13/2018 02/04/2018 06/16/2017  Decreased Interest 2 3 0 0 1  Down, Depressed, Hopeless 2 3 1  0 1  PHQ - 2 Score 4 6 1  0 2  Altered sleeping 0 3 3 3 2   Tired, decreased energy 2 3 3 2 2   Change in appetite 0 0 1 1 3   Feeling bad or failure about  yourself  0 1 0 0 1  Trouble concentrating 0 3 3 0 2  Moving slowly or fidgety/restless 0 0 1 0 0  Suicidal thoughts 0 0 0 0 0  PHQ-9 Score 6 16 12 6 12   Difficult doing work/chores Very difficult Extremely dIfficult Somewhat difficult Very difficult Very difficult  Some encounter information is confidential and restricted. Go to Review Flowsheets activity to see all data.   .. GAD 7 : Generalized Anxiety Score 09/17/2019 07/26/2019 11/13/2018 02/04/2018  Nervous, Anxious, on Edge 3 3 3 2    Control/stop worrying 2 3 3 1   Worry too much - different things 2 0 3 1  Trouble relaxing 2 3 3 2   Restless 0 3 1 2   Easily annoyed or irritable 1 3 1 1   Afraid - awful might happen 0 1 1 0  Total GAD 7 Score 10 16 15 9   Anxiety Difficulty Very difficult Very difficult Somewhat difficult Very difficult  Some encounter information is confidential and restricted. Go to Review Flowsheets activity to see all data.     Assessment and Plan: Marland [name] KitchenDiagnoses and all orders for this visit:  Seizure disorder ([place])  Generalized anxiety disorder -     busPIRone (BUSPAR) 10 MG tablet; Take 1 tablet (10 mg total) by mouth 3 (three) times daily. -     citalopram (CELEXA) 20 MG tablet; Take 1 tablet (20 mg total) by mouth daily. -     ALPRAZolam (XANAX) 0.5 MG tablet; Take 1 tablet (0.5 mg total) by mouth 2 (two) times daily as needed for anxiety.  Stress reaction -     ALPRAZolam (XANAX) 0.5 MG tablet; Take 1 tablet (0.5 mg total) by mouth 2 (two) times daily as needed for anxiety.  History of alcoholism ([place])  Moderate episode of recurrent major depressive disorder (HCC) -     citalopram (CELEXA) 20 MG tablet; Take 1 tablet (20 mg total) by mouth daily.   Although patient reports no significant improvement with anxiety both her PHQ 9 and GAD 7 scores decreased by 10 and 6 points.  The like this could be having more effect than she is realizing.  I would like to increase the BuSpar to 10 mg 3 times a day.  Continue to try not to use the Xanax only as needed and even try to split the tablets in half if she needs something.  The goal is to get her off daily Xanax.  I would also like to consider increasing Celexa to 20 mg.  This can increase the seizure potential.  We will reach out to Dr. Jannifer Franklin to see his opinion in increasing Celexa to 20 mg.  Patient is on Lamictal for seizure prevention.  She wonders if there is anything else she can take.  She has not had any seizures since the seizure triggered by  Wellbutrin.   Follow Up Instructions:    I discussed the assessment and treatment plan with the patient. The patient was provided an opportunity to ask questions and all were answered. The patient agreed with the plan and demonstrated an understanding of the instructions.   The patient was advised to call back or seek an in-person evaluation if the symptoms worsen or if the condition fails to improve as anticipated.  I provided 25 minutes of non-face-to-face time during this encounter.   Iran Planas, PA-C

## 2019-09-17 NOTE — Patient Instructions (Signed)
Increase buspar to 10mg  3 times a day.  Increase celexa to 20mg  once a day. Pt concerned about increase risk of seizure. Will call Dr. .

## 2019-09-17 NOTE — Progress Notes (Signed)
Pt reports that she doesn't feel that her current regimen is helping with her anxiety.

## 2019-09-28 ENCOUNTER — Telehealth: Payer: Self-pay | Admitting: Physician Assistant

## 2019-09-28 NOTE — Telephone Encounter (Signed)
mychart msg.  

## 2019-09-29 NOTE — Telephone Encounter (Signed)
??   For Deanna Keller

## 2019-09-29 NOTE — Telephone Encounter (Signed)
When I called the pt and left a voice mail she was out of town, I spoke to her today and she will pick it up tomorrow!

## 2019-10-11 ENCOUNTER — Other Ambulatory Visit: Payer: Self-pay | Admitting: Physician Assistant

## 2019-10-11 DIAGNOSIS — F331 Major depressive disorder, recurrent, moderate: Secondary | ICD-10-CM

## 2019-10-11 DIAGNOSIS — F411 Generalized anxiety disorder: Secondary | ICD-10-CM

## 2019-10-18 ENCOUNTER — Other Ambulatory Visit: Payer: Self-pay | Admitting: Physician Assistant

## 2019-10-18 DIAGNOSIS — M5441 Lumbago with sciatica, right side: Secondary | ICD-10-CM

## 2019-10-18 DIAGNOSIS — G4700 Insomnia, unspecified: Secondary | ICD-10-CM

## 2019-10-18 DIAGNOSIS — M5442 Lumbago with sciatica, left side: Secondary | ICD-10-CM

## 2019-10-19 NOTE — Telephone Encounter (Signed)
Last appt 09/24/2019 Ambien - last filled 04/26/2019 #60 with 2 rf Flexeril - last filled 05/05/2019 #90 with 1 rf

## 2019-11-17 ENCOUNTER — Other Ambulatory Visit: Payer: Self-pay | Admitting: Physician Assistant

## 2020-01-05 ENCOUNTER — Telehealth: Payer: Self-pay | Admitting: Physician Assistant

## 2020-01-05 DIAGNOSIS — F411 Generalized anxiety disorder: Secondary | ICD-10-CM

## 2020-01-05 DIAGNOSIS — F430 Acute stress reaction: Secondary | ICD-10-CM

## 2020-01-05 MED ORDER — ALPRAZOLAM 0.5 MG PO TABS: 0.5000 mg | ORAL_TABLET | Freq: Two times a day (BID) | ORAL | 2 refills | Status: DC | PRN

## 2020-01-05 NOTE — Telephone Encounter (Signed)
Pt calls in for refills on xanax.  Last visit for 6/4.

## 2020-01-10 ENCOUNTER — Other Ambulatory Visit: Payer: Self-pay | Admitting: Physician Assistant

## 2020-01-10 DIAGNOSIS — F331 Major depressive disorder, recurrent, moderate: Secondary | ICD-10-CM

## 2020-01-10 DIAGNOSIS — F411 Generalized anxiety disorder: Secondary | ICD-10-CM

## 2020-01-14 DIAGNOSIS — Z03818 Encounter for observation for suspected exposure to other biological agents ruled out: Secondary | ICD-10-CM | POA: Diagnosis not present

## 2020-01-14 DIAGNOSIS — Z20822 Contact with and (suspected) exposure to covid-19: Secondary | ICD-10-CM | POA: Diagnosis not present

## 2020-01-25 ENCOUNTER — Other Ambulatory Visit: Payer: Self-pay | Admitting: Physician Assistant

## 2020-01-25 DIAGNOSIS — R569 Unspecified convulsions: Secondary | ICD-10-CM

## 2020-01-31 ENCOUNTER — Telehealth: Payer: Self-pay | Admitting: Physician Assistant

## 2020-01-31 DIAGNOSIS — F331 Major depressive disorder, recurrent, moderate: Secondary | ICD-10-CM

## 2020-01-31 NOTE — Telephone Encounter (Signed)
Worsening depression. Would like to consider TMS.

## 2020-02-07 DIAGNOSIS — Z20822 Contact with and (suspected) exposure to covid-19: Secondary | ICD-10-CM | POA: Diagnosis not present

## 2020-02-17 DIAGNOSIS — F410 Panic disorder [episodic paroxysmal anxiety] without agoraphobia: Secondary | ICD-10-CM | POA: Diagnosis not present

## 2020-02-17 DIAGNOSIS — F4001 Agoraphobia with panic disorder: Secondary | ICD-10-CM | POA: Diagnosis not present

## 2020-02-17 DIAGNOSIS — F411 Generalized anxiety disorder: Secondary | ICD-10-CM | POA: Diagnosis not present

## 2020-02-17 DIAGNOSIS — F339 Major depressive disorder, recurrent, unspecified: Secondary | ICD-10-CM | POA: Diagnosis not present

## 2020-03-16 DIAGNOSIS — F4001 Agoraphobia with panic disorder: Secondary | ICD-10-CM | POA: Diagnosis not present

## 2020-03-16 DIAGNOSIS — F410 Panic disorder [episodic paroxysmal anxiety] without agoraphobia: Secondary | ICD-10-CM | POA: Diagnosis not present

## 2020-03-16 DIAGNOSIS — F411 Generalized anxiety disorder: Secondary | ICD-10-CM | POA: Diagnosis not present

## 2020-03-16 DIAGNOSIS — F339 Major depressive disorder, recurrent, unspecified: Secondary | ICD-10-CM | POA: Diagnosis not present

## 2020-03-21 DIAGNOSIS — F410 Panic disorder [episodic paroxysmal anxiety] without agoraphobia: Secondary | ICD-10-CM | POA: Diagnosis not present

## 2020-03-21 DIAGNOSIS — F339 Major depressive disorder, recurrent, unspecified: Secondary | ICD-10-CM | POA: Diagnosis not present

## 2020-03-21 DIAGNOSIS — F4001 Agoraphobia with panic disorder: Secondary | ICD-10-CM | POA: Diagnosis not present

## 2020-03-21 DIAGNOSIS — F411 Generalized anxiety disorder: Secondary | ICD-10-CM | POA: Diagnosis not present

## 2020-03-23 DIAGNOSIS — F4001 Agoraphobia with panic disorder: Secondary | ICD-10-CM | POA: Diagnosis not present

## 2020-03-23 DIAGNOSIS — F410 Panic disorder [episodic paroxysmal anxiety] without agoraphobia: Secondary | ICD-10-CM | POA: Diagnosis not present

## 2020-03-23 DIAGNOSIS — F1011 Alcohol abuse, in remission: Secondary | ICD-10-CM | POA: Diagnosis not present

## 2020-03-23 DIAGNOSIS — F411 Generalized anxiety disorder: Secondary | ICD-10-CM | POA: Diagnosis not present

## 2020-03-28 ENCOUNTER — Encounter: Payer: BC Managed Care – PPO | Admitting: Physician Assistant

## 2020-03-28 ENCOUNTER — Other Ambulatory Visit: Payer: Self-pay | Admitting: Physician Assistant

## 2020-03-28 DIAGNOSIS — F4001 Agoraphobia with panic disorder: Secondary | ICD-10-CM | POA: Diagnosis not present

## 2020-03-28 DIAGNOSIS — F410 Panic disorder [episodic paroxysmal anxiety] without agoraphobia: Secondary | ICD-10-CM | POA: Diagnosis not present

## 2020-03-28 DIAGNOSIS — F339 Major depressive disorder, recurrent, unspecified: Secondary | ICD-10-CM | POA: Diagnosis not present

## 2020-03-28 DIAGNOSIS — F1011 Alcohol abuse, in remission: Secondary | ICD-10-CM | POA: Diagnosis not present

## 2020-03-28 DIAGNOSIS — F411 Generalized anxiety disorder: Secondary | ICD-10-CM

## 2020-03-31 ENCOUNTER — Encounter: Payer: BC Managed Care – PPO | Admitting: Physician Assistant

## 2020-04-03 ENCOUNTER — Ambulatory Visit (INDEPENDENT_AMBULATORY_CARE_PROVIDER_SITE_OTHER): Payer: BC Managed Care – PPO | Admitting: Physician Assistant

## 2020-04-03 ENCOUNTER — Other Ambulatory Visit: Payer: Self-pay

## 2020-04-03 VITALS — BP 110/63 | HR 87 | Temp 97.9°F | Ht 65.0 in | Wt 173.0 lb

## 2020-04-03 DIAGNOSIS — Z131 Encounter for screening for diabetes mellitus: Secondary | ICD-10-CM

## 2020-04-03 DIAGNOSIS — Z23 Encounter for immunization: Secondary | ICD-10-CM

## 2020-04-03 DIAGNOSIS — J310 Chronic rhinitis: Secondary | ICD-10-CM | POA: Insufficient documentation

## 2020-04-03 DIAGNOSIS — Z0000 Encounter for general adult medical examination without abnormal findings: Secondary | ICD-10-CM | POA: Diagnosis not present

## 2020-04-03 DIAGNOSIS — Z13220 Encounter for screening for lipoid disorders: Secondary | ICD-10-CM | POA: Diagnosis not present

## 2020-04-03 DIAGNOSIS — M549 Dorsalgia, unspecified: Secondary | ICD-10-CM

## 2020-04-03 MED ORDER — IPRATROPIUM BROMIDE 0.06 % NA SOLN: 2.0000 | Freq: Four times a day (QID) | NASAL | 1 refills | Status: DC

## 2020-04-03 NOTE — Patient Instructions (Addendum)
Try atrovent as needed On bad days do up to 3 days of sudafed/phenylephrine (tylenol cold sinus severe)  Vitamin D/Zinc  Health Maintenance, Female Dr. Tonia Brooms  Adopting a healthy lifestyle and getting preventive care are important in promoting health and wellness. Ask your health care provider about:  The right schedule for you to have regular tests and exams.  Things you can do on your own to prevent diseases and keep yourself healthy. What should I know about diet, weight, and exercise? Eat a healthy diet   Eat a diet that includes plenty of vegetables, fruits, low-fat dairy products, and lean protein.  Do not eat a lot of foods that are high in solid fats, added sugars, or sodium. Maintain a healthy weight Body mass index (BMI) is used to identify weight problems. It estimates body fat based on height and weight. Your health care provider can help determine your BMI and help you achieve or maintain a healthy weight. Get regular exercise Get regular exercise. This is one of the most important things you can do for your health. Most adults should:  Exercise for at least 150 minutes each week. The exercise should increase your heart rate and make you sweat (moderate-intensity exercise).  Do strengthening exercises at least twice a week. This is in addition to the moderate-intensity exercise.  Spend less time sitting. Even light physical activity can be beneficial. Watch cholesterol and blood lipids Have your blood tested for lipids and cholesterol at 37 years of age, then have this test every 5 years. Have your cholesterol levels checked more often if:  Your lipid or cholesterol levels are high.  You are older than 37 years of age.  You are at high risk for heart disease. What should I know about cancer screening? Depending on your health history and family history, you may need to have cancer screening at various ages. This may include screening for:  Breast  cancer.  Cervical cancer.  Colorectal cancer.  Skin cancer.  Lung cancer. What should I know about heart disease, diabetes, and high blood pressure? Blood pressure and heart disease  High blood pressure causes heart disease and increases the risk of stroke. This is more likely to develop in people who have high blood pressure readings, are of African descent, or are overweight.  Have your blood pressure checked: ? Every 3-5 years if you are 61-70 years of age. ? Every year if you are 37 years old or older. Diabetes Have regular diabetes screenings. This checks your fasting blood sugar level. Have the screening done:  Once every three years after age 74 if you are at a normal weight and have a low risk for diabetes.  More often and at a younger age if you are overweight or have a high risk for diabetes. What should I know about preventing infection? Hepatitis B If you have a higher risk for hepatitis B, you should be screened for this virus. Talk with your health care provider to find out if you are at risk for hepatitis B infection. Hepatitis C Testing is recommended for:  Everyone born from [birth year] through 1963.  Anyone with known risk factors for hepatitis C. Sexually transmitted infections (STIs)  Get screened for STIs, including gonorrhea and chlamydia, if: ? You are sexually active and are younger than 37 years of age. ? You are older than 37 years of age and your health care provider tells you that you are at risk for this type of infection. ? Your  sexual activity has changed since you were last screened, and you are at increased risk for chlamydia or gonorrhea. Ask your health care provider if you are at risk.  Ask your health care provider about whether you are at high risk for HIV. Your health care provider may recommend a prescription medicine to help prevent HIV infection. If you choose to take medicine to prevent HIV, you should first get tested for HIV. You should  then be tested every 3 months for as long as you are taking the medicine. Pregnancy  If you are about to stop having your period (premenopausal) and you may become pregnant, seek counseling before you get pregnant.  Take 400 to 800 micrograms (mcg) of folic acid every day if you become pregnant.  Ask for birth control (contraception) if you want to prevent pregnancy. Osteoporosis and menopause Osteoporosis is a disease in which the bones lose minerals and strength with aging. This can result in bone fractures. If you are 73 years old or older, or if you are at risk for osteoporosis and fractures, ask your health care provider if you should:  Be screened for bone loss.  Take a calcium or vitamin D supplement to lower your risk of fractures.  Be given hormone replacement therapy (HRT) to treat symptoms of menopause. Follow these instructions at home: Lifestyle  Do not use any products that contain nicotine or tobacco, such as cigarettes, e-cigarettes, and chewing tobacco. If you need help quitting, ask your health care provider.  Do not use street drugs.  Do not share needles.  Ask your health care provider for help if you need support or information about quitting drugs. Alcohol use  Do not drink alcohol if: ? Your health care provider tells you not to drink. ? You are pregnant, may be pregnant, or are planning to become pregnant.  If you drink alcohol: ? Limit how much you use to 0-1 drink a day. ? Limit intake if you are breastfeeding.  Be aware of how much alcohol is in your drink. In the [place] one drink equals one 12 oz bottle of beer (355 mL), one 5 oz glass of wine (148 mL), or one 1 oz glass of hard liquor (44 mL). General instructions  Schedule regular health, dental, and eye exams.  Stay current with your vaccines.  Tell your health care provider if: ? You often feel depressed. ? You have ever been abused or do not feel safe at home. Summary  Adopting a  healthy lifestyle and getting preventive care are important in promoting health and wellness.  Follow your health care provider's instructions about healthy diet, exercising, and getting tested or screened for diseases.  Follow your health care provider's instructions on monitoring your cholesterol and blood pressure. This information is not intended to replace advice given to you by your health care provider. Make sure you discuss any questions you have with your health care provider. Document Revised: 03/25/2018 Document Reviewed: 03/25/2018 Elsevier Patient Education  2020 [employer].

## 2020-04-03 NOTE — Progress Notes (Signed)
Subjective:     Deanna Keller is a 39 y.o. female and is here for a comprehensive physical exam. The patient reports problems - she is having some upper back pain due to what she feels like is breast pulling down on her back.    Pt is doing great with weight loss. Down 25lbs in the last 6 months.   Her mood is doing much better with mood treatment center. Counseling is helping a lot.    Ongoing runny nose. Not tried anything to make better. Not taking anti-histamine. Seems to be worsening just a bit. She is having more mucus congestion in chest.   Social History   Socioeconomic History  . Marital status: Married    Spouse name: Not on file  . Number of children: Not on file  . Years of education: Not on file  . Highest education level: Not on file  Occupational History  . Not on file  Tobacco Use  . Smoking status: Current Every Day Smoker    Packs/day: 0.50    Years: 10.00    Pack years: 5.00    Types: E-cigarettes    Start date: 07/21/2012  . Smokeless tobacco: Never Used  Vaping Use  . Vaping Use: Never used  Substance and Sexual Activity  . Alcohol use: No  . Drug use: No  . Sexual activity: Yes    Partners: Male  Other Topics Concern  . Not on file  Social History Narrative   Right handed   2.5 cups of cafeine    Lives at home with husband and 75 year old   Social Determinants of Health   Financial Resource Strain: Not on file  Food Insecurity: Not on file  Transportation Needs: Not on file  Physical Activity: Not on file  Stress: Not on file  Social Connections: Not on file  Intimate Partner Violence: Not on file   Health Maintenance  Topic Date Due  . Hepatitis C Screening  04/03/2021 (Originally 1980/07/12)  . HIV Screening  04/03/2021 (Originally 05/14/1997)  . PAP SMEAR-Modifier  03/28/2022  . TETANUS/TDAP  07/14/2023  . INFLUENZA VACCINE  Completed  . COVID-19 Vaccine  Completed    The following portions of the patient's history were reviewed and  updated as appropriate: allergies, current medications, past family history, past medical history, past social history, past surgical history and problem list.  Review of Systems Pertinent items are noted in HPI. Pertinent items noted in HPI and remainder of comprehensive ROS otherwise negative.   Objective:    BP 110/63   Pulse 87   Temp 97.9 F (36.6 C) (Oral)   Ht 5\' 5"  (1.651 m)   Wt 173 lb (78.5 kg)   SpO2 100%   BMI 28.79 kg/m  General appearance: alert, cooperative and appears stated age Head: Normocephalic, without obvious abnormality, atraumatic Eyes: conjunctivae/corneas clear. PERRL, EOM's intact. Fundi benign. Ears: normal TM's and external ear canals both ears Nose: Nares normal. Septum midline. Mucosa normal. No drainage or sinus tenderness. Throat: lips, mucosa, and tongue normal; teeth and gums normal Neck: no adenopathy, no carotid bruit, no JVD, supple, symmetrical, trachea midline and thyroid not enlarged, symmetric, no tenderness/mass/nodules Back: symmetric, no curvature. ROM normal. No CVA tenderness. Lungs: clear to auscultation bilaterally Heart: regular rate and rhythm, S1, S2 normal, no murmur, click, rub or gallop Abdomen: soft, non-tender; bowel sounds normal; no masses,  no organomegaly Extremities: extremities normal, atraumatic, no cyanosis or edema Pulses: 2+ and symmetric Skin: Skin color,  texture, turgor normal. No rashes or lesions Lymph nodes: Cervical, supraclavicular, and axillary nodes normal. Neurologic: Alert and oriented X 3, normal strength and tone. Normal symmetric reflexes. Normal coordination and gait   .[name] Depression screen [facility] 2/9 04/03/2020 09/17/2019 07/26/2019 11/13/2018 02/04/2018  Decreased Interest 1 2 3  0 0  Down, Depressed, Hopeless 1 2 3 1  0  PHQ - 2 Score 2 4 6 1  0  Altered sleeping 0 0 3 3 3   Tired, decreased energy 2 2 3 3 2   Change in appetite 0 0 0 1 1  Feeling bad or failure about yourself  2 0 1 0 0  Trouble  concentrating 1 0 3 3 0  Moving slowly or fidgety/restless 0 0 0 1 0  Suicidal thoughts 0 0 0 0 0  PHQ-9 Score 7 6 16 12 6   Difficult doing work/chores - Very difficult Extremely dIfficult Somewhat difficult Very difficult  Some encounter information is confidential and restricted. Go to Review Flowsheets activity to see all data.   .. GAD 7 : Generalized Anxiety Score 04/03/2020 09/17/2019 07/26/2019 11/13/2018  Nervous, Anxious, on Edge 3 3 3 3   Control/stop worrying 3 2 3 3   Worry too much - different things 3 2 0 3  Trouble relaxing 2 2 3 3   Restless 0 0 3 1  Easily annoyed or irritable 2 1 3 1   Afraid - awful might happen 0 0 1 1  Total GAD 7 Score 13 10 16 15   Anxiety Difficulty - Very difficult Very difficult Somewhat difficult  Some encounter information is confidential and restricted. Go to Review Flowsheets activity to see all data.     Assessment:    Healthy female exam.      Plan:       Deanna Keller was seen today for annual exam.  Diagnoses and all orders for this visit:  Routine physical examination -     Lipid Panel w/reflex Direct LDL -     COMPLETE METABOLIC PANEL WITH GFR -     CBC with Differential/Platelet  Flu vaccine need -     Flu Vaccine QUAD 36+ mos IM  Screening for lipid disorders -     Lipid Panel w/reflex Direct LDL  Screening for diabetes mellitus -     COMPLETE METABOLIC PANEL WITH GFR  Chronic rhinitis -     ipratropium (ATROVENT) 0.06 % nasal spray; Place 2 sprays into both nostrils 4 (four) times daily.   .. Discussed 150 minutes of exercise a week.  Encouraged vitamin D 1000 units and Calcium 1300mg  or 4 servings of dairy a day.  Fasting labs ordered.  Pap UTD.  Flu and covid vaccine UTD.   Chronic rhinitis- consider atrovent nasal spray. Vitamin D/vitamin C/Zinc for immune support. Consider anti-histamine daily. Ok for tylenol cold sinus severe.  Upper back pain: work on posture. Wear a good supportive bra. Consider breast lift.  Posture supporter.   See After Visit Summary for Counseling Recommendations

## 2020-04-04 DIAGNOSIS — Z01419 Encounter for gynecological examination (general) (routine) without abnormal findings: Secondary | ICD-10-CM | POA: Diagnosis not present

## 2020-04-04 DIAGNOSIS — Z6828 Body mass index (BMI) 28.0-28.9, adult: Secondary | ICD-10-CM | POA: Diagnosis not present

## 2020-04-05 ENCOUNTER — Encounter: Payer: Self-pay | Admitting: Physician Assistant

## 2020-04-05 DIAGNOSIS — Z13220 Encounter for screening for lipoid disorders: Secondary | ICD-10-CM | POA: Diagnosis not present

## 2020-04-05 DIAGNOSIS — Z131 Encounter for screening for diabetes mellitus: Secondary | ICD-10-CM | POA: Diagnosis not present

## 2020-04-05 DIAGNOSIS — Z0000 Encounter for general adult medical examination without abnormal findings: Secondary | ICD-10-CM | POA: Diagnosis not present

## 2020-04-06 ENCOUNTER — Encounter: Payer: Self-pay | Admitting: Physician Assistant

## 2020-04-06 LAB — COMPLETE METABOLIC PANEL WITH GFR
AG Ratio: 1.8 (calc) (ref 1.0–2.5)
ALT: 36 U/L — ABNORMAL HIGH (ref 6–29)
AST: 28 U/L (ref 10–30)
Albumin: 4.3 g/dL (ref 3.6–5.1)
Alkaline phosphatase (APISO): 68 U/L (ref 31–125)
BUN: 9 mg/dL (ref 7–25)
CO2: 30 mmol/L (ref 20–32)
Calcium: 9.1 mg/dL (ref 8.6–10.2)
Chloride: 103 mmol/L (ref 98–110)
Creat: 0.71 mg/dL (ref 0.50–1.10)
GFR, Est African American: 126 mL/min/{1.73_m2} (ref >=60)
GFR, Est Non African American: 109 mL/min/{1.73_m2} (ref >=60)
Globulin: 2.4 g/dL (calc) (ref 1.9–3.7)
Glucose, Bld: 87 mg/dL (ref 65–99)
Potassium: 4.2 mmol/L (ref 3.5–5.3)
Sodium: 139 mmol/L (ref 135–146)
Total Bilirubin: 0.4 mg/dL (ref 0.2–1.2)
Total Protein: 6.7 g/dL (ref 6.1–8.1)

## 2020-04-06 LAB — CBC WITH DIFFERENTIAL/PLATELET
Absolute Monocytes: 349 cells/uL (ref 200–950)
Basophils Absolute: 29 cells/uL (ref 0–200)
Basophils Relative: 0.8 %
Eosinophils Absolute: 338 cells/uL (ref 15–500)
Eosinophils Relative: 9.4 %
HCT: 39.7 % (ref 35.0–45.0)
Hemoglobin: 13.4 g/dL (ref 11.7–15.5)
Lymphs Abs: 1141 cells/uL (ref 850–3900)
MCH: 31.3 pg (ref 27.0–33.0)
MCHC: 33.8 g/dL (ref 32.0–36.0)
MCV: 92.8 fL (ref 80.0–100.0)
MPV: 10 fL (ref 7.5–12.5)
Monocytes Relative: 9.7 %
Neutro Abs: 1742 cells/uL (ref 1500–7800)
Neutrophils Relative %: 48.4 %
Platelets: 292 10*3/uL (ref 140–400)
RBC: 4.28 10*6/uL (ref 3.80–5.10)
RDW: 11.8 % (ref 11.0–15.0)
Total Lymphocyte: 31.7 %
WBC: 3.6 10*3/uL — ABNORMAL LOW (ref 3.8–10.8)

## 2020-04-06 LAB — LIPID PANEL W/REFLEX DIRECT LDL
Cholesterol: 177 mg/dL (ref <200)
HDL: 82 mg/dL (ref >=50)
LDL Cholesterol (Calc): 82 mg/dL (calc)
Non-HDL Cholesterol (Calc): 95 mg/dL (calc) (ref <130)
Total CHOL/HDL Ratio: 2.2 (calc) (ref <5.0)
Triglycerides: 57 mg/dL (ref <150)

## 2020-04-06 NOTE — Progress Notes (Signed)
Marielena,   Cholesterol looks great. Liver enzymes are getting better. Glucose and kidney look wonderful.

## 2020-04-10 MED ORDER — AZITHROMYCIN 250 MG PO TABS: ORAL_TABLET | ORAL | 0 refills | Status: DC

## 2020-04-11 DIAGNOSIS — F411 Generalized anxiety disorder: Secondary | ICD-10-CM | POA: Diagnosis not present

## 2020-04-11 DIAGNOSIS — F4001 Agoraphobia with panic disorder: Secondary | ICD-10-CM | POA: Diagnosis not present

## 2020-04-11 DIAGNOSIS — F1011 Alcohol abuse, in remission: Secondary | ICD-10-CM | POA: Diagnosis not present

## 2020-04-11 DIAGNOSIS — F339 Major depressive disorder, recurrent, unspecified: Secondary | ICD-10-CM | POA: Diagnosis not present

## 2020-04-18 DIAGNOSIS — F411 Generalized anxiety disorder: Secondary | ICD-10-CM | POA: Diagnosis not present

## 2020-04-18 DIAGNOSIS — F1011 Alcohol abuse, in remission: Secondary | ICD-10-CM | POA: Diagnosis not present

## 2020-04-18 DIAGNOSIS — F4001 Agoraphobia with panic disorder: Secondary | ICD-10-CM | POA: Diagnosis not present

## 2020-04-18 DIAGNOSIS — F339 Major depressive disorder, recurrent, unspecified: Secondary | ICD-10-CM | POA: Diagnosis not present

## 2020-04-25 DIAGNOSIS — F339 Major depressive disorder, recurrent, unspecified: Secondary | ICD-10-CM | POA: Diagnosis not present

## 2020-04-25 DIAGNOSIS — F1011 Alcohol abuse, in remission: Secondary | ICD-10-CM | POA: Diagnosis not present

## 2020-04-25 DIAGNOSIS — F4001 Agoraphobia with panic disorder: Secondary | ICD-10-CM | POA: Diagnosis not present

## 2020-04-25 DIAGNOSIS — F410 Panic disorder [episodic paroxysmal anxiety] without agoraphobia: Secondary | ICD-10-CM | POA: Diagnosis not present

## 2020-04-26 ENCOUNTER — Other Ambulatory Visit: Payer: Self-pay | Admitting: Physician Assistant

## 2020-04-26 DIAGNOSIS — J310 Chronic rhinitis: Secondary | ICD-10-CM

## 2020-05-02 DIAGNOSIS — F4001 Agoraphobia with panic disorder: Secondary | ICD-10-CM | POA: Diagnosis not present

## 2020-05-02 DIAGNOSIS — F339 Major depressive disorder, recurrent, unspecified: Secondary | ICD-10-CM | POA: Diagnosis not present

## 2020-05-02 DIAGNOSIS — F410 Panic disorder [episodic paroxysmal anxiety] without agoraphobia: Secondary | ICD-10-CM | POA: Diagnosis not present

## 2020-05-02 DIAGNOSIS — F1011 Alcohol abuse, in remission: Secondary | ICD-10-CM | POA: Diagnosis not present

## 2020-05-04 ENCOUNTER — Other Ambulatory Visit: Payer: Self-pay | Admitting: Physician Assistant

## 2020-05-04 DIAGNOSIS — M5441 Lumbago with sciatica, right side: Secondary | ICD-10-CM

## 2020-05-11 DIAGNOSIS — F4001 Agoraphobia with panic disorder: Secondary | ICD-10-CM | POA: Diagnosis not present

## 2020-05-11 DIAGNOSIS — F410 Panic disorder [episodic paroxysmal anxiety] without agoraphobia: Secondary | ICD-10-CM | POA: Diagnosis not present

## 2020-05-11 DIAGNOSIS — F1011 Alcohol abuse, in remission: Secondary | ICD-10-CM | POA: Diagnosis not present

## 2020-05-11 DIAGNOSIS — F411 Generalized anxiety disorder: Secondary | ICD-10-CM | POA: Diagnosis not present

## 2020-05-12 ENCOUNTER — Other Ambulatory Visit: Payer: Self-pay | Admitting: Physician Assistant

## 2020-05-12 DIAGNOSIS — J310 Chronic rhinitis: Secondary | ICD-10-CM

## 2020-05-16 DIAGNOSIS — F339 Major depressive disorder, recurrent, unspecified: Secondary | ICD-10-CM | POA: Diagnosis not present

## 2020-05-16 DIAGNOSIS — F410 Panic disorder [episodic paroxysmal anxiety] without agoraphobia: Secondary | ICD-10-CM | POA: Diagnosis not present

## 2020-05-16 DIAGNOSIS — F1011 Alcohol abuse, in remission: Secondary | ICD-10-CM | POA: Diagnosis not present

## 2020-05-16 DIAGNOSIS — F4001 Agoraphobia with panic disorder: Secondary | ICD-10-CM | POA: Diagnosis not present

## 2020-05-23 DIAGNOSIS — F339 Major depressive disorder, recurrent, unspecified: Secondary | ICD-10-CM | POA: Diagnosis not present

## 2020-05-23 DIAGNOSIS — F1011 Alcohol abuse, in remission: Secondary | ICD-10-CM | POA: Diagnosis not present

## 2020-05-23 DIAGNOSIS — F410 Panic disorder [episodic paroxysmal anxiety] without agoraphobia: Secondary | ICD-10-CM | POA: Diagnosis not present

## 2020-05-23 DIAGNOSIS — F4001 Agoraphobia with panic disorder: Secondary | ICD-10-CM | POA: Diagnosis not present

## 2020-06-20 DIAGNOSIS — F410 Panic disorder [episodic paroxysmal anxiety] without agoraphobia: Secondary | ICD-10-CM | POA: Diagnosis not present

## 2020-06-20 DIAGNOSIS — F339 Major depressive disorder, recurrent, unspecified: Secondary | ICD-10-CM | POA: Diagnosis not present

## 2020-06-20 DIAGNOSIS — F4001 Agoraphobia with panic disorder: Secondary | ICD-10-CM | POA: Diagnosis not present

## 2020-06-20 DIAGNOSIS — F1011 Alcohol abuse, in remission: Secondary | ICD-10-CM | POA: Diagnosis not present

## 2020-06-27 DIAGNOSIS — F411 Generalized anxiety disorder: Secondary | ICD-10-CM | POA: Diagnosis not present

## 2020-06-27 DIAGNOSIS — F339 Major depressive disorder, recurrent, unspecified: Secondary | ICD-10-CM | POA: Diagnosis not present

## 2020-06-27 DIAGNOSIS — F1011 Alcohol abuse, in remission: Secondary | ICD-10-CM | POA: Diagnosis not present

## 2020-06-27 DIAGNOSIS — F410 Panic disorder [episodic paroxysmal anxiety] without agoraphobia: Secondary | ICD-10-CM | POA: Diagnosis not present

## 2020-06-29 ENCOUNTER — Encounter: Payer: Self-pay | Admitting: Family Medicine

## 2020-06-29 ENCOUNTER — Encounter: Payer: Self-pay | Admitting: Physician Assistant

## 2020-06-29 ENCOUNTER — Telehealth (INDEPENDENT_AMBULATORY_CARE_PROVIDER_SITE_OTHER): Payer: BC Managed Care – PPO | Admitting: Family Medicine

## 2020-06-29 DIAGNOSIS — R6889 Other general symptoms and signs: Secondary | ICD-10-CM

## 2020-06-29 MED ORDER — OSELTAMIVIR PHOSPHATE 75 MG PO CAPS
75.0000 mg | ORAL_CAPSULE | Freq: Two times a day (BID) | ORAL | 0 refills | Status: DC
Start: 2020-06-29 — End: 2020-09-06

## 2020-06-29 MED ORDER — GUAIFENESIN ER 600 MG PO TB12: 1200.0000 mg | ORAL_TABLET | Freq: Two times a day (BID) | ORAL | 2 refills | Status: DC

## 2020-06-29 NOTE — Patient Instructions (Signed)

## 2020-06-29 NOTE — Progress Notes (Signed)
Virtual Video Visit via MyChart Note  I connected with  Deanna Keller on 06/29/20 at  1:00 PM EDT by the video enabled telemedicine application for MyChart, and verified that I am speaking with the correct person using two identifiers.   I introduced myself as a Publishing rights manager with the practice. We discussed the limitations of evaluation and management by telemedicine and the availability of in person appointments. The patient expressed understanding and agreed to proceed.  Participating parties in this visit include: The patient and the nurse practitioner listed. The patient is: At home I am: In the office - Primary Care Kathryne Sharper  Subjective:    CC:  Chief Complaint  Patient presents with  . URI    HPI: Deanna Keller is a 40 y.o. year old female presenting today via MyChart today for flu-like symptoms.  Patient reports that yesterday evening she suddenly felt like she had been hit by a truck: chills, fever 101.3 F, sore throat, body aches, headache, dry cough, mild diarrhea, and nasal congestion. She states she felt horrible all throughout the night and ended up taking a COVID test at home in the middle of the night which was negative. 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 She reports she tried to take some sudafed this morning which may have helped temporarily, but she is continuing to feel terrible with fever/chills, body aches, and headache being her worst symptoms. She denies chest pain, shortness of breath, signs of dehydration.  She does not feel like she can come by for a drive-up flu-test, but would like to go ahead and try Tamiflu.   Past medical history, Surgical history, Family history not pertinant except as noted below, Social history, Allergies, and medications have been entered into the medical record, reviewed, and corrections made.   Review of Systems:  All review of systems negative except  what is listed in the HPI   Objective:    General:  Speaking clearly in complete sentences. Absent shortness of breath noted.   Alert and oriented x3.   Normal judgment.  Absent acute distress.   Impression and Recommendations:    1. Flu-like symptoms - oseltamivir (TAMIFLU) 75 MG capsule; Take 1 capsule (75 mg total) by mouth 2 (two) times daily.  Dispense: 10 capsule; Refill: 0 - guaiFENesin (MUCINEX) 600 MG 12 hr tablet; Take 2 tablets (1,200 mg total) by mouth 2 (two) times daily.  Dispense: 30 tablet; Refill: 2  Patient with flu-like presentation and negative home COVID test. She does not feel like she can come by for a flu-test, but would like for me to go ahead and send in Tamiflu for her. We discussed the medication and agreed to go ahead and treat empirically. Encouraged rest, hydration, Tylenol, ibuprofen, and infection prevention. Educated on signs and symptoms that would require further evaluation.   Follow-up if symptoms worsen or fail to improve.    I discussed the assessment and treatment plan with the patient. The patient was provided an opportunity to ask questions and all were answered. The patient agreed with the plan and demonstrated an understanding of the instructions.   The patient was advised to call back or seek an in-person evaluation if the symptoms worsen or if the condition fails to improve as anticipated.  I provided 20 minutes of non-face-to-face interaction with this MYCHART visit including intake, same-day documentation, and chart review.   Clayborne Dana, NP

## 2020-07-03 ENCOUNTER — Encounter: Payer: Self-pay | Admitting: Medical-Surgical

## 2020-07-03 ENCOUNTER — Telehealth (INDEPENDENT_AMBULATORY_CARE_PROVIDER_SITE_OTHER): Payer: BC Managed Care – PPO | Admitting: Medical-Surgical

## 2020-07-03 VITALS — HR 90 | Temp 96.0°F

## 2020-07-03 DIAGNOSIS — J40 Bronchitis, not specified as acute or chronic: Secondary | ICD-10-CM | POA: Diagnosis not present

## 2020-07-03 DIAGNOSIS — J329 Chronic sinusitis, unspecified: Secondary | ICD-10-CM | POA: Diagnosis not present

## 2020-07-03 MED ORDER — PREDNISONE 50 MG PO TABS: 50.0000 mg | ORAL_TABLET | Freq: Every day | ORAL | 0 refills | Status: DC

## 2020-07-03 MED ORDER — AZITHROMYCIN 250 MG PO TABS: ORAL_TABLET | ORAL | 0 refills | Status: DC

## 2020-07-03 MED ORDER — BENZONATATE 200 MG PO CAPS: 200.0000 mg | ORAL_CAPSULE | Freq: Three times a day (TID) | ORAL | 0 refills | Status: DC | PRN

## 2020-07-03 MED ORDER — ALBUTEROL SULFATE HFA 108 (90 BASE) MCG/ACT IN AERS: 2.0000 | INHALATION_SPRAY | Freq: Four times a day (QID) | RESPIRATORY_TRACT | 0 refills | Status: DC | PRN

## 2020-07-03 MED ORDER — HYDROCODONE-HOMATROPINE 5-1.5 MG/5ML PO SYRP: 5.0000 mL | ORAL_SOLUTION | Freq: Three times a day (TID) | ORAL | 0 refills | Status: DC | PRN

## 2020-07-03 NOTE — Progress Notes (Signed)
Virtual Visit via Video Note  I connected with Deanna Keller on 07/03/20 at 10:10 AM EDT by a video enabled telemedicine application and verified that I am speaking with the correct person using two identifiers.   I discussed the limitations of evaluation and management by telemedicine and the availability of in person appointments. The patient expressed understanding and agreed to proceed.  Patient location: home Provider locations: office  Subjective:    CC: Upper respiratory symptoms  HPI: Pleasant 40 year old female presenting via MyChart video visit for continued upper respiratory symptoms.  She was seen on 06/29/2020 in a virtual visit with Hyman Hopes, NP for viral symptoms that were felt to be flulike.  She was prescribed Tamiflu 75 mg twice daily x5 days as well as Mucinex twice daily.  She has 1 pill of Tamiflu left as of our visit and reports that she is not feeling any better.  She continues to have a cough with burning in her chest, malaise, brain fog, and severe fatigue.  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  She has been using over-the-counter cold and flu remedies with minimal relief.  Cough is nonproductive and it wakes her frequently at night.  She does note that she has had quite a bit of wheezing in the past couple of days.  Denies sinus congestion although she does note she has postnasal drip.  Started taking Zyrtec today.  Past medical history, Surgical history, Family history not pertinant except as noted below, Social history, Allergies, and medications have been entered into the medical record, reviewed, and corrections made.   Review of Systems: See HPI for pertinent positives and negatives.   Objective:    General: Speaking clearly in complete sentences without any shortness of breath.  Alert and oriented x3.  Normal judgment. No apparent acute distress.  Impression and  Recommendations:    1. Sinobronchitis Discussed the expectation for resolution of viral symptoms being 7 to 10 days.  In the setting of her continued busy schedule, this will likely be longer rather than shorter.  Her symptoms are consistent with a viral infection but could also be exacerbated by allergies.  At this point, with the burning in her chest, feel that she has developed a sinobronchitis.  Treating with prednisone 50 mg daily x5, Tessalon Perles for cough suppression during the day, Hycodan cough syrup for nighttime use to facilitate sleep, an albuterol inhaler as needed for wheezing, increased fluids, and increasing rest.  Because of her busy schedule and continued symptoms, sending in a azithromycin with instructions to start this if no improvement in her symptoms with the rest of the medications after 48 hours.  Continue Zyrtec daily or consider switching to Zyrtec-D since she has no history of hypertension.  Patient verbalized understanding and is agreeable to the plan.  I discussed the assessment and treatment plan with the patient. The patient was provided an opportunity to ask questions and all were answered. The patient agreed with the plan and demonstrated an understanding of the instructions.   The patient was advised to call back or seek an in-person evaluation if the symptoms worsen or if the condition fails to improve as anticipated.  20 minutes of non-face-to-face time was provided during this encounter.  Return if symptoms worsen or fail to improve.  Thayer Ohm, DNP, APRN, FNP-BC [facility]

## 2020-07-04 DIAGNOSIS — F4001 Agoraphobia with panic disorder: Secondary | ICD-10-CM | POA: Diagnosis not present

## 2020-07-04 DIAGNOSIS — F410 Panic disorder [episodic paroxysmal anxiety] without agoraphobia: Secondary | ICD-10-CM | POA: Diagnosis not present

## 2020-07-04 DIAGNOSIS — F411 Generalized anxiety disorder: Secondary | ICD-10-CM | POA: Diagnosis not present

## 2020-07-04 DIAGNOSIS — F1011 Alcohol abuse, in remission: Secondary | ICD-10-CM | POA: Diagnosis not present

## 2020-07-09 ENCOUNTER — Other Ambulatory Visit: Payer: Self-pay | Admitting: Medical-Surgical

## 2020-07-10 ENCOUNTER — Encounter: Payer: Self-pay | Admitting: Physician Assistant

## 2020-07-10 DIAGNOSIS — F410 Panic disorder [episodic paroxysmal anxiety] without agoraphobia: Secondary | ICD-10-CM | POA: Diagnosis not present

## 2020-07-10 DIAGNOSIS — G4700 Insomnia, unspecified: Secondary | ICD-10-CM

## 2020-07-10 DIAGNOSIS — F1011 Alcohol abuse, in remission: Secondary | ICD-10-CM | POA: Diagnosis not present

## 2020-07-10 DIAGNOSIS — F339 Major depressive disorder, recurrent, unspecified: Secondary | ICD-10-CM | POA: Diagnosis not present

## 2020-07-10 MED ORDER — HYDROCODONE-HOMATROPINE 5-1.5 MG/5ML PO SYRP: 5.0000 mL | ORAL_SOLUTION | Freq: Three times a day (TID) | ORAL | 0 refills | Status: DC | PRN

## 2020-07-11 MED ORDER — ZOLPIDEM TARTRATE 5 MG PO TABS: ORAL_TABLET | ORAL | 1 refills | Status: DC

## 2020-07-18 DIAGNOSIS — F4001 Agoraphobia with panic disorder: Secondary | ICD-10-CM | POA: Diagnosis not present

## 2020-07-18 DIAGNOSIS — F339 Major depressive disorder, recurrent, unspecified: Secondary | ICD-10-CM | POA: Diagnosis not present

## 2020-07-18 DIAGNOSIS — F1011 Alcohol abuse, in remission: Secondary | ICD-10-CM | POA: Diagnosis not present

## 2020-07-18 DIAGNOSIS — F411 Generalized anxiety disorder: Secondary | ICD-10-CM | POA: Diagnosis not present

## 2020-07-21 ENCOUNTER — Other Ambulatory Visit: Payer: Self-pay | Admitting: Physician Assistant

## 2020-07-21 ENCOUNTER — Other Ambulatory Visit: Payer: Self-pay | Admitting: Medical-Surgical

## 2020-08-02 DIAGNOSIS — F411 Generalized anxiety disorder: Secondary | ICD-10-CM | POA: Diagnosis not present

## 2020-08-02 DIAGNOSIS — F4001 Agoraphobia with panic disorder: Secondary | ICD-10-CM | POA: Diagnosis not present

## 2020-08-02 DIAGNOSIS — F410 Panic disorder [episodic paroxysmal anxiety] without agoraphobia: Secondary | ICD-10-CM | POA: Diagnosis not present

## 2020-08-02 DIAGNOSIS — F1011 Alcohol abuse, in remission: Secondary | ICD-10-CM | POA: Diagnosis not present

## 2020-08-08 DIAGNOSIS — F4001 Agoraphobia with panic disorder: Secondary | ICD-10-CM | POA: Diagnosis not present

## 2020-08-08 DIAGNOSIS — F410 Panic disorder [episodic paroxysmal anxiety] without agoraphobia: Secondary | ICD-10-CM | POA: Diagnosis not present

## 2020-08-08 DIAGNOSIS — F1011 Alcohol abuse, in remission: Secondary | ICD-10-CM | POA: Diagnosis not present

## 2020-08-08 DIAGNOSIS — F339 Major depressive disorder, recurrent, unspecified: Secondary | ICD-10-CM | POA: Diagnosis not present

## 2020-08-11 DIAGNOSIS — M9902 Segmental and somatic dysfunction of thoracic region: Secondary | ICD-10-CM | POA: Diagnosis not present

## 2020-08-11 DIAGNOSIS — M4726 Other spondylosis with radiculopathy, lumbar region: Secondary | ICD-10-CM | POA: Diagnosis not present

## 2020-08-11 DIAGNOSIS — M9901 Segmental and somatic dysfunction of cervical region: Secondary | ICD-10-CM | POA: Diagnosis not present

## 2020-08-11 DIAGNOSIS — M4722 Other spondylosis with radiculopathy, cervical region: Secondary | ICD-10-CM | POA: Diagnosis not present

## 2020-08-14 DIAGNOSIS — M4726 Other spondylosis with radiculopathy, lumbar region: Secondary | ICD-10-CM | POA: Diagnosis not present

## 2020-08-14 DIAGNOSIS — M9901 Segmental and somatic dysfunction of cervical region: Secondary | ICD-10-CM | POA: Diagnosis not present

## 2020-08-14 DIAGNOSIS — M4722 Other spondylosis with radiculopathy, cervical region: Secondary | ICD-10-CM | POA: Diagnosis not present

## 2020-08-14 DIAGNOSIS — M9902 Segmental and somatic dysfunction of thoracic region: Secondary | ICD-10-CM | POA: Diagnosis not present

## 2020-08-16 DIAGNOSIS — M9902 Segmental and somatic dysfunction of thoracic region: Secondary | ICD-10-CM | POA: Diagnosis not present

## 2020-08-16 DIAGNOSIS — M9901 Segmental and somatic dysfunction of cervical region: Secondary | ICD-10-CM | POA: Diagnosis not present

## 2020-08-16 DIAGNOSIS — M4726 Other spondylosis with radiculopathy, lumbar region: Secondary | ICD-10-CM | POA: Diagnosis not present

## 2020-08-16 DIAGNOSIS — M4722 Other spondylosis with radiculopathy, cervical region: Secondary | ICD-10-CM | POA: Diagnosis not present

## 2020-08-17 DIAGNOSIS — M4722 Other spondylosis with radiculopathy, cervical region: Secondary | ICD-10-CM | POA: Diagnosis not present

## 2020-08-17 DIAGNOSIS — M9902 Segmental and somatic dysfunction of thoracic region: Secondary | ICD-10-CM | POA: Diagnosis not present

## 2020-08-17 DIAGNOSIS — M9901 Segmental and somatic dysfunction of cervical region: Secondary | ICD-10-CM | POA: Diagnosis not present

## 2020-08-17 DIAGNOSIS — M4726 Other spondylosis with radiculopathy, lumbar region: Secondary | ICD-10-CM | POA: Diagnosis not present

## 2020-08-21 DIAGNOSIS — M4722 Other spondylosis with radiculopathy, cervical region: Secondary | ICD-10-CM | POA: Diagnosis not present

## 2020-08-21 DIAGNOSIS — M4726 Other spondylosis with radiculopathy, lumbar region: Secondary | ICD-10-CM | POA: Diagnosis not present

## 2020-08-21 DIAGNOSIS — M9902 Segmental and somatic dysfunction of thoracic region: Secondary | ICD-10-CM | POA: Diagnosis not present

## 2020-08-21 DIAGNOSIS — M9901 Segmental and somatic dysfunction of cervical region: Secondary | ICD-10-CM | POA: Diagnosis not present

## 2020-08-22 DIAGNOSIS — F339 Major depressive disorder, recurrent, unspecified: Secondary | ICD-10-CM | POA: Diagnosis not present

## 2020-08-22 DIAGNOSIS — F410 Panic disorder [episodic paroxysmal anxiety] without agoraphobia: Secondary | ICD-10-CM | POA: Diagnosis not present

## 2020-08-22 DIAGNOSIS — F1011 Alcohol abuse, in remission: Secondary | ICD-10-CM | POA: Diagnosis not present

## 2020-08-22 DIAGNOSIS — F4001 Agoraphobia with panic disorder: Secondary | ICD-10-CM | POA: Diagnosis not present

## 2020-08-23 DIAGNOSIS — M9902 Segmental and somatic dysfunction of thoracic region: Secondary | ICD-10-CM | POA: Diagnosis not present

## 2020-08-23 DIAGNOSIS — M4726 Other spondylosis with radiculopathy, lumbar region: Secondary | ICD-10-CM | POA: Diagnosis not present

## 2020-08-23 DIAGNOSIS — M9901 Segmental and somatic dysfunction of cervical region: Secondary | ICD-10-CM | POA: Diagnosis not present

## 2020-08-23 DIAGNOSIS — M4722 Other spondylosis with radiculopathy, cervical region: Secondary | ICD-10-CM | POA: Diagnosis not present

## 2020-08-24 DIAGNOSIS — M4722 Other spondylosis with radiculopathy, cervical region: Secondary | ICD-10-CM | POA: Diagnosis not present

## 2020-08-24 DIAGNOSIS — M9902 Segmental and somatic dysfunction of thoracic region: Secondary | ICD-10-CM | POA: Diagnosis not present

## 2020-08-24 DIAGNOSIS — M9901 Segmental and somatic dysfunction of cervical region: Secondary | ICD-10-CM | POA: Diagnosis not present

## 2020-08-24 DIAGNOSIS — M4726 Other spondylosis with radiculopathy, lumbar region: Secondary | ICD-10-CM | POA: Diagnosis not present

## 2020-08-28 DIAGNOSIS — M9902 Segmental and somatic dysfunction of thoracic region: Secondary | ICD-10-CM | POA: Diagnosis not present

## 2020-08-28 DIAGNOSIS — M4722 Other spondylosis with radiculopathy, cervical region: Secondary | ICD-10-CM | POA: Diagnosis not present

## 2020-08-28 DIAGNOSIS — M9901 Segmental and somatic dysfunction of cervical region: Secondary | ICD-10-CM | POA: Diagnosis not present

## 2020-08-28 DIAGNOSIS — M4726 Other spondylosis with radiculopathy, lumbar region: Secondary | ICD-10-CM | POA: Diagnosis not present

## 2020-08-30 DIAGNOSIS — M4726 Other spondylosis with radiculopathy, lumbar region: Secondary | ICD-10-CM | POA: Diagnosis not present

## 2020-08-30 DIAGNOSIS — M4722 Other spondylosis with radiculopathy, cervical region: Secondary | ICD-10-CM | POA: Diagnosis not present

## 2020-08-30 DIAGNOSIS — M9901 Segmental and somatic dysfunction of cervical region: Secondary | ICD-10-CM | POA: Diagnosis not present

## 2020-08-30 DIAGNOSIS — M9902 Segmental and somatic dysfunction of thoracic region: Secondary | ICD-10-CM | POA: Diagnosis not present

## 2020-08-30 DIAGNOSIS — F4001 Agoraphobia with panic disorder: Secondary | ICD-10-CM | POA: Diagnosis not present

## 2020-08-30 DIAGNOSIS — F1011 Alcohol abuse, in remission: Secondary | ICD-10-CM | POA: Diagnosis not present

## 2020-08-30 DIAGNOSIS — F411 Generalized anxiety disorder: Secondary | ICD-10-CM | POA: Diagnosis not present

## 2020-08-30 DIAGNOSIS — F410 Panic disorder [episodic paroxysmal anxiety] without agoraphobia: Secondary | ICD-10-CM | POA: Diagnosis not present

## 2020-08-31 DIAGNOSIS — M4722 Other spondylosis with radiculopathy, cervical region: Secondary | ICD-10-CM | POA: Diagnosis not present

## 2020-08-31 DIAGNOSIS — M4726 Other spondylosis with radiculopathy, lumbar region: Secondary | ICD-10-CM | POA: Diagnosis not present

## 2020-08-31 DIAGNOSIS — M9901 Segmental and somatic dysfunction of cervical region: Secondary | ICD-10-CM | POA: Diagnosis not present

## 2020-08-31 DIAGNOSIS — M9902 Segmental and somatic dysfunction of thoracic region: Secondary | ICD-10-CM | POA: Diagnosis not present

## 2020-09-03 ENCOUNTER — Encounter: Payer: Self-pay | Admitting: Physician Assistant

## 2020-09-04 DIAGNOSIS — F411 Generalized anxiety disorder: Secondary | ICD-10-CM | POA: Diagnosis not present

## 2020-09-04 DIAGNOSIS — F4001 Agoraphobia with panic disorder: Secondary | ICD-10-CM | POA: Diagnosis not present

## 2020-09-04 DIAGNOSIS — F339 Major depressive disorder, recurrent, unspecified: Secondary | ICD-10-CM | POA: Diagnosis not present

## 2020-09-04 DIAGNOSIS — F1011 Alcohol abuse, in remission: Secondary | ICD-10-CM | POA: Diagnosis not present

## 2020-09-06 ENCOUNTER — Encounter: Payer: Self-pay | Admitting: Physician Assistant

## 2020-09-06 ENCOUNTER — Telehealth (INDEPENDENT_AMBULATORY_CARE_PROVIDER_SITE_OTHER): Payer: BC Managed Care – PPO | Admitting: Physician Assistant

## 2020-09-06 DIAGNOSIS — R569 Unspecified convulsions: Secondary | ICD-10-CM | POA: Diagnosis not present

## 2020-09-06 DIAGNOSIS — F411 Generalized anxiety disorder: Secondary | ICD-10-CM | POA: Diagnosis not present

## 2020-09-06 DIAGNOSIS — Z9884 Bariatric surgery status: Secondary | ICD-10-CM | POA: Diagnosis not present

## 2020-09-06 DIAGNOSIS — F331 Major depressive disorder, recurrent, moderate: Secondary | ICD-10-CM

## 2020-09-06 DIAGNOSIS — G4700 Insomnia, unspecified: Secondary | ICD-10-CM

## 2020-09-06 DIAGNOSIS — G4489 Other headache syndrome: Secondary | ICD-10-CM

## 2020-09-06 MED ORDER — VENLAFAXINE HCL ER 37.5 MG PO CP24: 37.5000 mg | ORAL_CAPSULE | Freq: Every day | ORAL | 0 refills | Status: DC

## 2020-09-06 NOTE — Telephone Encounter (Signed)
Patient is scheduled and aware - CF °

## 2020-09-06 NOTE — Progress Notes (Signed)
Had seizure Sunday  Witnessed by husband and son No injuries Lasted 2-3 minutes Psychologist changed lamictal to 100 mg TID from 200 mg QD And tapering her off gabapentin (was taking 300 mg BID)

## 2020-09-06 NOTE — Progress Notes (Signed)
Patient ID: Deanna Keller, female   DOB: 1980-06-10, 40 y.o.   MRN: [id number] .Marland KitchenVirtual Visit via Video Note  I connected with [name] on 09/12/20 at  3:00 PM EDT by a video enabled telemedicine application and verified that I am speaking with the correct person using two identifiers.  Location: Patient: home Provider: clinic  .Marland KitchenParticipating in visit:  Patient: Addalyne Provider: Tandy Gaw PA-C   I discussed the limitations of evaluation and management by telemedicine and the availability of in person appointments. The patient expressed understanding and agreed to proceed.  History of Present Illness: Pt is a 40 yo female who presents to the clinic via video to follow up after a seizure after some medication changes were made by [facility]. Seizure was witness by husband and son on 08/31/2020 and lasted about 2-3 minutes. No injuries due to seizure.This is the day after Dr. Wilburt Finlay increased gabapentin to 300mg  bid and lower lamictal to 200mg  a day from 100mg  TID and effexor 150mg  daily. [facility] called next day to make some medication adjustments.  Decreased gabapentin to 100mg  TID, effexor decreased to 75mg  daily, increased lamictal back to 100mg  TID.   She continues to have a headache post seizure. Using tylenol. She is worried about not being able to drive.   .. Active Ambulatory Problems    Diagnosis Date Noted  . History of alcoholism (HCC) 07/21/2013  . B12 deficiency 07/21/2013  . Iron deficiency 07/21/2013  . Depression 11/10/2013  . Elevated LFTs 11/10/2013  . Chronic migraine 11/10/2013  . Malabsorption 11/10/2013  . S/P gastric bypass 11/11/2013  . Vitamin D deficiency 11/11/2013  . Recovering alcoholic in remission (HCC) 06/16/2014  . Lumbar degenerative disc disease 06/20/2014  . Bariatric surgery status in pregnancy 02/10/2013  . Phlebectasia 10/21/2011  . Hypoglycemia following gastrointestinal surgery 04/12/2013  . Generalized seizure (HCC) 08/30/2014  . Left  lumbar radiculitis 10/11/2014  . IUD check up 11/24/2014  . Postoperative blind loop syndrome 04/12/2013  . Generalized anxiety disorder 09/06/2015  . Stress reaction 06/16/2017  . Weight gain 10/03/2017  . No energy 02/05/2018  . Thyromegaly 02/05/2018  . Upper back pain 02/05/2018  . Hx of thyroid nodule 02/08/2018  . Frequent infections 06/16/2018  . Arthralgia 06/16/2018  . Tachycardia 12/28/2018  . Anxiety about health 12/28/2018  . Insomnia 12/28/2018  . Seizure (HCC) 12/28/2018  . Chronic rhinitis 04/03/2020  . Moderate episode of recurrent major depressive disorder (HCC) 09/12/2020  . Post-seizure headache 09/12/2020   Resolved Ambulatory Problems    Diagnosis Date Noted  . Viral URI 12/20/2014  . Benzodiazepine misuse (HCC) 09/06/2015  . Acute bronchitis 12/26/2015  . Class 2 obesity due to excess calories without serious comorbidity with body mass index (BMI) of 35.0 to 35.9 in adult 10/03/2017   Past Medical History:  Diagnosis Date  . Alcohol abuse   . Anxiety   . Seizure disorder (HCC) 12/28/2018      Observations/Objective: No acute distress No labored breathing Anxious mood.   09/14/2020.There were no vitals filed for this visit. There is no height or weight on file to calculate BMI.     Assessment and Plan: 06/02/202211/06/2016Ionia was seen today for seizures.  Diagnoses and all orders for this visit:  Seizure (HCC)  Insomnia, unspecified type  Generalized anxiety disorder -     venlafaxine XR (EFFEXOR-XR) 37.5 MG 24 hr capsule; Take 1 capsule (37.5 mg total) by mouth daily with breakfast.  S/P gastric bypass  Moderate episode of recurrent  major depressive disorder (HCC) -     venlafaxine XR (EFFEXOR-XR) 37.5 MG 24 hr capsule; Take 1 capsule (37.5 mg total) by mouth daily with breakfast.  Post-seizure headache   Pt has a witnessed seizure after some medication changes. Pt is very sensitive to medication changes. Her last seizure was induced by  wellbutrin/tramadol.   Lamictal was decreased and gabapentin was increased, effexor was increased.  Seizure could have been induced more to effexor increase.  Needs to see Dr. Anne Hahn.  Continue with higher dose of lamictal and stay at same dose of gabapentin for now. Go back down to 37.5mg  of effexor.  Called and got patient in with neurology work in June 6th.  Pt should not drive until seen by neurology.  Continue to use tylenol for headache, stay hydrated, discussed no blue lights, get plenty of rest.     Follow Up Instructions:    I discussed the assessment and treatment plan with the patient. The patient was provided an opportunity to ask questions and all were answered. The patient agreed with the plan and demonstrated an understanding of the instructions.   The patient was advised to call back or seek an in-person evaluation if the symptoms worsen or if the condition fails to improve as anticipated.   Tandy Gaw, PA-C

## 2020-09-08 ENCOUNTER — Other Ambulatory Visit: Payer: Self-pay

## 2020-09-08 ENCOUNTER — Ambulatory Visit (INDEPENDENT_AMBULATORY_CARE_PROVIDER_SITE_OTHER): Payer: BC Managed Care – PPO | Admitting: Sports Medicine

## 2020-09-08 VITALS — BP 108/58 | HR 81

## 2020-09-08 DIAGNOSIS — G43911 Migraine, unspecified, intractable, with status migrainosus: Secondary | ICD-10-CM | POA: Diagnosis not present

## 2020-09-08 MED ORDER — PROMETHAZINE HCL 25 MG/ML IJ SOLN
25.0000 mg | Freq: Once | INTRAMUSCULAR | Status: AC
  Administered 2020-09-08: 25 mg via INTRAMUSCULAR

## 2020-09-08 MED ORDER — KETOROLAC TROMETHAMINE 60 MG/2ML IM SOLN
60.0000 mg | Freq: Once | INTRAMUSCULAR | Status: AC
  Administered 2020-09-08: 60 mg via INTRAMUSCULAR

## 2020-09-08 MED ORDER — DEXAMETHASONE SODIUM PHOSPHATE 10 MG/ML IJ SOLN
8.0000 mg | Freq: Once | INTRAMUSCULAR | Status: AC
  Administered 2020-09-08: 8 mg via INTRAMUSCULAR

## 2020-09-08 NOTE — Progress Notes (Signed)
Pt in for migraine cocktail per Tandy Gaw (Mychart messages).  Injections tolerated well without complications.

## 2020-09-12 ENCOUNTER — Telehealth: Payer: BC Managed Care – PPO | Admitting: Physician Assistant

## 2020-09-12 DIAGNOSIS — G4489 Other headache syndrome: Secondary | ICD-10-CM | POA: Insufficient documentation

## 2020-09-12 DIAGNOSIS — F331 Major depressive disorder, recurrent, moderate: Secondary | ICD-10-CM | POA: Insufficient documentation

## 2020-09-14 ENCOUNTER — Encounter: Payer: Self-pay | Admitting: Physician Assistant

## 2020-09-15 ENCOUNTER — Encounter: Payer: Self-pay | Admitting: Physician Assistant

## 2020-09-15 MED ORDER — CEPHALEXIN 500 MG PO CAPS: 500.0000 mg | ORAL_CAPSULE | Freq: Four times a day (QID) | ORAL | 0 refills | Status: DC

## 2020-09-15 MED ORDER — GABAPENTIN 100 MG PO CAPS: 100.0000 mg | ORAL_CAPSULE | Freq: Two times a day (BID) | ORAL | 0 refills | Status: DC

## 2020-09-18 ENCOUNTER — Encounter: Payer: Self-pay | Admitting: Neurology

## 2020-09-18 ENCOUNTER — Ambulatory Visit (INDEPENDENT_AMBULATORY_CARE_PROVIDER_SITE_OTHER): Payer: BC Managed Care – PPO | Admitting: Neurology

## 2020-09-18 VITALS — BP 108/75 | HR 84 | Ht 65.0 in | Wt 176.0 lb

## 2020-09-18 DIAGNOSIS — G479 Sleep disorder, unspecified: Secondary | ICD-10-CM

## 2020-09-18 DIAGNOSIS — Z5181 Encounter for therapeutic drug level monitoring: Secondary | ICD-10-CM | POA: Diagnosis not present

## 2020-09-18 MED ORDER — LAMOTRIGINE 200 MG PO TABS: 200.0000 mg | ORAL_TABLET | Freq: Two times a day (BID) | ORAL | 3 refills | Status: DC

## 2020-09-18 MED ORDER — GABAPENTIN 100 MG PO CAPS: 200.0000 mg | ORAL_CAPSULE | Freq: Two times a day (BID) | ORAL | Status: DC

## 2020-09-18 NOTE — Patient Instructions (Signed)
We will go up on the Lamictal to a 200 mg twice a day dose.

## 2020-09-18 NOTE — Progress Notes (Signed)
Reason for visit: Seizures  Deanna Keller is an 40 y.o. female  History of present illness:  Deanna Keller is a 40 year old right-handed white female with a history of a seizure disorder.  The patient has a significant underlying anxiety disorder, she has been followed through a psychiatric nurse practitioner in [place] for her anxiety.  The patient has been on Effexor taking 75 mg twice daily, she was on gabapentin and Lamictal.  She has been on alprazolam taking 2 mg twice daily.  The patient had a recurring seizure 2 weeks ago, shortly after a reduction in the Lamictal and gabapentin doses, she was reduced from 400 mg a day to a 300 mg daily regimen of Lamictal.  The patient has had chronic issues with fatigue and excessive daytime drowsiness.  The patient had a seizure without warning, she had a generalized event followed by nausea and vomiting afterwards.  She returns to the office today for an evaluation.  She had not missed any further doses of medication.  Past Medical History:  Diagnosis Date  . Alcohol abuse    sober since 2011  . Anxiety   . Seizure disorder ([place]) 12/28/2018    Past Surgical History:  Procedure Laterality Date  . CERVICAL CONE BIOPSY    . GASTRIC BYPASS  2005    Family History  Problem Relation Age of Onset  . Colon cancer Mother   . Depression Mother   . Hypertension Father     Social history:  reports that she has quit smoking. Her smoking use included e-cigarettes. She started smoking about 8 years ago. She has a 5.00 pack-year smoking history. She uses smokeless tobacco. She reports that she does not drink alcohol and does not use drugs.    Allergies  Allergen Reactions  . Augmentin [Amoxicillin-Pot Clavulanate] Nausea And Vomiting  . Tramadol Other (See Comments)    Possible seizure  . Wellbutrin [Bupropion]     Seizure    Medications:  Prior to Admission medications   Medication Sig Start Date End Date Taking? Authorizing Provider   ALPRAZolam (XANAX XR) 2 MG 24 hr tablet Take 2 mg by mouth. 03/23/20   [provider]  cephALEXin (KEFLEX) 500 MG capsule Take 1 capsule (500 mg total) by mouth 4 (four) times daily. For 10 days. 09/15/20   Breeback, Jade L, PA-C  Cyanocobalamin 1000 MCG/ML KIT Inject 1 mL every two weeks. 06/09/18   Breeback, Jade L, PA-C  cyclobenzaprine (FLEXERIL) 10 MG tablet TAKE 1 TABLET BY MOUTH EVERYDAY AT BEDTIME 05/04/20   Breeback, Jade L, PA-C  gabapentin (NEURONTIN) 100 MG capsule Take 1 capsule (100 mg total) by mouth 2 (two) times daily. 09/15/20   Breeback, Jade L, PA-C  Insulin Pen Needle 31G X 6 MM MISC Use once daily with saxenda daily. 11/13/18   Breeback, Royetta Car, PA-C  lamoTRIgine (LAMICTAL) 100 MG tablet Take 100 mg by mouth 3 (three) times daily. 09/02/20   [provider]  venlafaxine XR (EFFEXOR-XR) 37.5 MG 24 hr capsule Take 1 capsule (37.5 mg total) by mouth daily with breakfast. 09/06/20   Breeback, Jade L, PA-C  zolpidem (AMBIEN) 5 MG tablet Needs appt TAKE 1 TO 2 TABLETS BY MOUTH AT BEDTIME Patient not taking: Reported on 09/06/2020 07/11/20   Iran Planas L, PA-C    ROS:  Out of a complete 14 system review of symptoms, the patient complains only of the following symptoms, and all other reviewed systems are negative.  Seizures Anxiety  Fatigue  Blood pressure 108/75, pulse 84, height 5' 5"  (1.651 m), weight 176 lb (79.8 kg).  Physical Exam  General: The patient is alert and cooperative at the time of the examination.  Skin: No significant peripheral edema is noted.   Neurologic Exam  Mental status: The patient is alert and oriented x 3 at the time of the examination. The patient has apparent normal recent and remote memory, with an apparently normal attention span and concentration ability.   Cranial nerves: Facial symmetry is present. Speech is normal, no aphasia or dysarthria is noted. Extraocular movements are full. Visual fields are full.  Motor: The  patient has good strength in all 4 extremities.  Sensory examination: Soft touch sensation is symmetric on the face, arms, and legs.  Coordination: The patient has good finger-nose-finger and heel-to-shin bilaterally.  Gait and station: The patient has a normal gait. Tandem gait is normal. Romberg is negative. No drift is seen.  Reflexes: Deep tendon reflexes are symmetric.   Assessment/Plan:  1.  Anxiety disorder  2.  Seizure disorder, recent recurrence  3.  Excessive daytime drowsiness  The patient has had a recent seizure type event.  This occurred shortly after a reduction in the gabapentin and Lamictal dose.  The patient will be increased back to the 200 mg twice daily dose of Lamictal.  On this dose previously, she had a level of 10.1.  The patient does not operate a motor vehicle for 6 months.  She will follow-up here in 4 months.  We will set her up for a sleep evaluation given the excessive daytime drowsiness.  Jill Alexanders MD 09/18/2020 12:21 PM  [facility] [address] [place], [place] [address]  Phone [phone] Fax [phone]

## 2020-09-19 ENCOUNTER — Other Ambulatory Visit: Payer: Self-pay | Admitting: Neurology

## 2020-09-19 DIAGNOSIS — F411 Generalized anxiety disorder: Secondary | ICD-10-CM

## 2020-09-19 LAB — LAMOTRIGINE LEVEL: Lamotrigine Lvl: 3.8 ug/mL (ref 2.0–20.0)

## 2020-09-19 MED ORDER — ALPRAZOLAM 1 MG PO TABS: ORAL_TABLET | ORAL | 1 refills | Status: DC

## 2020-09-20 NOTE — Telephone Encounter (Signed)
Faxed referral to Dr. Carie Caddy office. Phone: [phone]. Fax: [phone].

## 2020-09-21 ENCOUNTER — Other Ambulatory Visit: Payer: Self-pay | Admitting: Neurology

## 2020-09-21 MED ORDER — GABAPENTIN 300 MG PO CAPS: 300.0000 mg | ORAL_CAPSULE | Freq: Three times a day (TID) | ORAL | 3 refills | Status: DC

## 2020-09-27 ENCOUNTER — Other Ambulatory Visit: Payer: Self-pay | Admitting: Neurology

## 2020-09-27 MED ORDER — GABAPENTIN 300 MG PO CAPS: 300.0000 mg | ORAL_CAPSULE | Freq: Every day | ORAL | 1 refills | Status: DC

## 2020-09-27 MED ORDER — GABAPENTIN 100 MG PO CAPS: ORAL_CAPSULE | ORAL | 1 refills | Status: DC

## 2020-10-05 ENCOUNTER — Encounter: Payer: Self-pay | Admitting: Neurology

## 2020-10-05 ENCOUNTER — Ambulatory Visit (INDEPENDENT_AMBULATORY_CARE_PROVIDER_SITE_OTHER): Payer: BC Managed Care – PPO | Admitting: Neurology

## 2020-10-05 ENCOUNTER — Other Ambulatory Visit: Payer: Self-pay

## 2020-10-05 VITALS — BP 117/74 | HR 90 | Ht 65.0 in | Wt 180.3 lb

## 2020-10-05 DIAGNOSIS — G40909 Epilepsy, unspecified, not intractable, without status epilepticus: Secondary | ICD-10-CM

## 2020-10-05 DIAGNOSIS — F17200 Nicotine dependence, unspecified, uncomplicated: Secondary | ICD-10-CM

## 2020-10-05 DIAGNOSIS — R519 Headache, unspecified: Secondary | ICD-10-CM

## 2020-10-05 DIAGNOSIS — R569 Unspecified convulsions: Secondary | ICD-10-CM

## 2020-10-05 DIAGNOSIS — F419 Anxiety disorder, unspecified: Secondary | ICD-10-CM

## 2020-10-05 DIAGNOSIS — G4719 Other hypersomnia: Secondary | ICD-10-CM | POA: Diagnosis not present

## 2020-10-05 DIAGNOSIS — R351 Nocturia: Secondary | ICD-10-CM | POA: Diagnosis not present

## 2020-10-05 NOTE — Progress Notes (Signed)
Subjective:    Patient ID: Barbarann Kelly is a 40 y.o. female.  HPI    Star Age, MD, PhD [facility] [address], [address] [place], [place] [address]  Dear Lanny Hurst,   I saw your patient, Kimika Streater, upon your kind request in my sleep clinic today for initial consultation of her sleep disorder, in particular, her excessive daytime somnolence.  The patient is unaccompanied today.  As you know, Ms. Dipalma is a 40 year old right-handed woman with an underlying medical history of seizure disorder, anxiety, alcohol use disorder (by chart review, in remission since 2011), and overweight state, who reports an approximately 2 to 3-year history of daytime somnolence.  Symptoms have been manifested primarily by tiredness and sleepiness during the day and difficulty maintaining sleep at night.  She has had some difficulty falling asleep as well.  She has had fluctuating anxiety and depression, has seen a psychiatric nurse practitioner in the past for anxiety.  She reports that her gabapentin was increased and her Lamictal was decreased.  She recently had an increase in her Lamictal after she had blood work which showed a subtherapeutic level for Lamictal.  She is not aware of any significant snoring.  Denies a lifelong history or several year history of severe sleepiness, has not fallen asleep at the wheel or while talking to someone.  Denies any cataplectic episodes, denies telltale symptoms of restless leg syndrome, does report vivid dreams.  She has a TV in the bedroom but has not had it on at night.  She has been working on improving sleep hygiene.  Her husband snores and sometimes it is disturbing to her but not significantly.  No pets in the household.  She is married and lives with her husband and 31-year-old son.  She works as a Cabin crew.  She has limited her caffeine, she drinks 1 cup of coffee per day on average, she has not had any alcohol in about 11 years.  She  quit smoking cigarettes 8 years ago but has been using quite a bit of nicotine pouches, up to 10/day.  She does feel some jitteriness when she uses it, also reports that perhaps she has overnight withdrawal from it which may contribute to her sleep difficulty she wonders.  She has been using a sleep tracker watch which shows significant amount of wakefulness throughout the night.  She has a prescription for Ambien but uses it very rarely.  I reviewed your office note from 09/18/2020.  Of note, she is on potentially sedating medications including gabapentin, Xanax, Lamictal, Effexor, and Ambien.  She was on Xanax long-acting 2 mg twice daily and is currently hoping to taper off of this.  She is now on 1 mg strength immediate release 1 pill 3 times daily with the plan to reduce it over time.  She has nocturia about once per average night and has woken up occasionally with a headache.   Her Epworth sleepiness score is 10 out of 24, fatigue severity score is 61 out of 63.  Her Past Medical History Is Significant For: Past Medical History:  Diagnosis Date   Alcohol abuse    sober since 2011   Anxiety    Seizure disorder ([place]) 12/28/2018    Her Past Surgical History Is Significant For: Past Surgical History:  Procedure Laterality Date   CERVICAL CONE BIOPSY     GASTRIC BYPASS  2005    Her Family History Is Significant For: Family History  Problem Relation  Age of Onset   Colon cancer Mother    Depression Mother    Hypertension Father     Her Social History Is Significant For: Social History   Socioeconomic History   Marital status: Married    Spouse name: Not on file   Number of children: Not on file   Years of education: Not on file   Highest education level: Not on file  Occupational History   Not on file  Tobacco Use   Smoking status: Former    Packs/day: 0.50    Years: 10.00    Pack years: 5.00    Types: E-cigarettes, Cigarettes    Start date: 07/21/2012   Smokeless tobacco:  Current   Tobacco comments:    uses nicotine pouches sometimes   Vaping Use   Vaping Use: Never used  Substance and Sexual Activity   Alcohol use: No   Drug use: No   Sexual activity: Yes    Partners: Male  Other Topics Concern   Not on file  Social History Narrative   Right handed   Caffeine maybe 1 cup/day   Lives at home with husband and 18 year old son    Social Determinants of Health   Financial Resource Strain: Not on file  Food Insecurity: Not on file  Transportation Needs: Not on file  Physical Activity: Not on file  Stress: Not on file  Social Connections: Not on file    Her Allergies Are:  Allergies  Allergen Reactions   Augmentin [Amoxicillin-Pot Clavulanate] Nausea And Vomiting   Tramadol Other (See Comments)    Possible seizure   Wellbutrin [Bupropion]     Seizure  :   Her Current Medications Are:  Outpatient Encounter Medications as of 10/05/2020  Medication Sig   ALPRAZolam (XANAX) 1 MG tablet 1 tablet 3 times daily for 6 weeks, then take 1 tablet twice daily   Cyanocobalamin 1000 MCG/ML KIT Inject 1 mL every two weeks.   gabapentin (NEURONTIN) 100 MG capsule 1 capsule in the morning, 1 midday   gabapentin (NEURONTIN) 300 MG capsule Take 1 capsule (300 mg total) by mouth at bedtime.   Insulin Pen Needle 31G X 6 MM MISC Use once daily with saxenda daily. (Patient taking differently: Use once with vitamin B12 injections)   lamoTRIgine (LAMICTAL) 200 MG tablet Take 1 tablet (200 mg total) by mouth 2 (two) times daily. (Patient taking differently: Take 400 mg by mouth daily.)   venlafaxine XR (EFFEXOR-XR) 37.5 MG 24 hr capsule Take 1 capsule (37.5 mg total) by mouth daily with breakfast.   zolpidem (AMBIEN) 5 MG tablet Needs appt TAKE 1 TO 2 TABLETS BY MOUTH AT BEDTIME   [DISCONTINUED] cephALEXin (KEFLEX) 500 MG capsule Take 1 capsule (500 mg total) by mouth 4 (four) times daily. For 10 days.   No facility-administered encounter medications on file as of  10/05/2020.  :   Review of Systems:  Out of a complete 14 point review of systems, all are reviewed and negative with the exception of these symptoms as listed below:  Review of Systems  Neurological:        Here for consult on sleep study.  Reports daytime sleepiness is present denies any snoring. Pt reports she is up and down at night and does not typically rest well. Denies having a sleep study in the past.  Epworth Sleepiness Scale 0= would never doze 1= slight chance of dozing 2= moderate chance of dozing 3= high chance of dozing  Sitting and reading:3 Watching TV:3 Sitting inactive in a public place (ex. Theater or meeting):0 As a passenger in a car for an hour without a break:0 Lying down to rest in the afternoon:3 Sitting and talking to someone:0 Sitting quietly after lunch (no alcohol):1 In a car, while stopped in traffic:0 Total:10     Objective:  Neurological Exam  Physical Exam Physical Examination:   Vitals:   10/05/20 0909  BP: 117/74  Pulse: 90    General Examination: The patient is a very pleasant 40 y.o. female in no acute distress. She appears well-developed and well-nourished and well groomed.   HEENT: Normocephalic, atraumatic, pupils are equal, round and reactive to light, extraocular tracking is good without limitation to gaze excursion or nystagmus noted. Hearing is grossly intact. Face is symmetric with normal facial animation. Speech is clear with no dysarthria noted. There is no hypophonia. There is no lip, neck/head, jaw or voice tremor. Neck is supple with full range of passive and active motion. There are no carotid bruits on auscultation. Oropharynx exam reveals: mild mouth dryness, good dental hygiene.  She has a small airway entry, tonsils on the small side, uvula also on the small side, Mallampati class I.  Neck circumference slender at 12-1/2 inches.  Tongue protrudes centrally and palate elevates symmetrically.   Chest: Clear to  auscultation without wheezing, rhonchi or crackles noted.  Heart: S1+S2+0, regular and normal without murmurs, rubs or gallops noted.   Abdomen: Soft, non-tender and non-distended.  Extremities: There is no obvious edema in the distal lower extremities bilaterally.   Skin: Warm and dry without trophic changes noted.   Musculoskeletal: exam reveals no obvious joint deformities, tenderness or joint swelling or erythema.   Neurologically:  Mental status: The patient is awake, alert and oriented in all 4 spheres. Her immediate and remote memory, attention, language skills and fund of knowledge are appropriate. There is no evidence of aphasia, agnosia, apraxia or anomia. Speech is clear with normal prosody and enunciation. Thought process is linear. Mood is normal and affect is normal.  Cranial nerves II - XII are as described above under HEENT exam.  Motor exam: Normal bulk, strength and tone is noted. There is a slight bilateral upper extremity postural and action tremor, no resting tremor. Fine motor skills and coordination: grossly intact.  Cerebellar testing: No dysmetria or intention tremor. There is no truncal or gait ataxia.  Sensory exam: intact to light touch.  Assessment and Plan:  In summary, Shaunda Tipping is a very pleasant 40 y.o.-year old female with an underlying medical history of seizure disorder, anxiety, alcohol use disorder (by chart review, in remission since 2011), and overweight state, who presents for evaluation of her sleep disturbance in particular difficulty maintaining sleep and daytime somnolence.  She is aware that some of her daytime somnolence may stem from medication effect.  She is working on medication adjustment.  She is furthermore advised to continue to work on lifestyle changes and in particular coming off of her nicotine product.  She is encouraged to talk to her primary care physician about ways to come off of nicotine as well.  She may benefit from using  nicotine patches and gradually taper off.  Her history is not telltale for narcolepsy.  She is advised that we can proceed with a polysomnogram to investigate if she has any underlying organic sleep disorder.  Sleep disordered breathing such as sleep apnea is within the possibilities as a cause for daytime somnolence.  To  that end, I would like for her to return for a sleep study.  We will request insurance authorization and call her to schedule this test.  I talked to her about sleep apnea, its prognosis and treatment options including the use of positive airway pressure such as CPAP or AutoPap.  She would be reluctant to use a machine but is not completely opposed to considering it depending on the test results of course.  She requested a recheck on her Lamictal level as this was discussed previously.  We will plan a follow-up after testing and we will keep her posted as to her results by phone call in the interim as well.  Thank you very much for allowing me to participate in the care of this nice patient. If I can be of any further assistance to you please do not hesitate to talk to me.   Sincerely,   Star Age, MD, PhD

## 2020-10-05 NOTE — Patient Instructions (Signed)
Based on your symptoms and your exam I believe we should look for an underlying organic sleep disorder with a sleep study.  If you have obstructive sleep apnea (OSA), I may want you to consider treatment with a so-called CPAP or AutoPap machine. Please remember, the risks and ramifications of moderate to severe obstructive sleep apnea or OSA are: Cardiovascular disease, including congestive heart failure, stroke, difficult to control hypertension, arrhythmias, and even type 2 diabetes has been linked to untreated OSA. Sleep apnea causes disruption of sleep and sleep deprivation in most cases, which, in turn, can cause recurrent headaches, problems with memory, mood, concentration, focus, and vigilance. Most people with untreated sleep apnea report excessive daytime sleepiness, which can affect their ability to drive. Please do not drive if you feel sleepy.   We will call you after your sleep study to advise about the results (most likely, you will hear from [facility], my nurse).    Our sleep lab administrative assistant, will call you to schedule your sleep study. If you don't hear back from her by about 2 weeks from now, please feel free to call her at [phone]. This is her direct line and please leave a message with your phone number to call back if you get the voicemail box. She will call back as soon as possible.

## 2020-10-06 LAB — LAMOTRIGINE LEVEL: Lamotrigine Lvl: 7.6 ug/mL (ref 2.0–20.0)

## 2020-10-11 ENCOUNTER — Other Ambulatory Visit: Payer: Self-pay | Admitting: Neurology

## 2020-10-11 ENCOUNTER — Other Ambulatory Visit: Payer: Self-pay | Admitting: Physician Assistant

## 2020-10-11 DIAGNOSIS — F331 Major depressive disorder, recurrent, moderate: Secondary | ICD-10-CM

## 2020-10-11 DIAGNOSIS — F411 Generalized anxiety disorder: Secondary | ICD-10-CM

## 2020-10-11 MED ORDER — VENLAFAXINE HCL ER 37.5 MG PO CP24: 37.5000 mg | ORAL_CAPSULE | Freq: Every day | ORAL | 1 refills | Status: DC

## 2020-10-23 ENCOUNTER — Encounter: Payer: Self-pay | Admitting: Physician Assistant

## 2020-10-23 MED ORDER — CYCLOBENZAPRINE HCL 10 MG PO TABS: 10.0000 mg | ORAL_TABLET | Freq: Three times a day (TID) | ORAL | 0 refills | Status: DC | PRN

## 2020-11-01 ENCOUNTER — Other Ambulatory Visit: Payer: Self-pay | Admitting: Neurology

## 2020-11-01 MED ORDER — LAMOTRIGINE ER 200 MG PO TB24: 400.0000 mg | ORAL_TABLET | Freq: Every day | ORAL | 1 refills | Status: DC

## 2020-11-11 ENCOUNTER — Other Ambulatory Visit: Payer: Self-pay | Admitting: Neurology

## 2020-11-13 ENCOUNTER — Other Ambulatory Visit: Payer: Self-pay | Admitting: Neurology

## 2020-11-13 DIAGNOSIS — Z5181 Encounter for therapeutic drug level monitoring: Secondary | ICD-10-CM

## 2020-11-14 ENCOUNTER — Other Ambulatory Visit (INDEPENDENT_AMBULATORY_CARE_PROVIDER_SITE_OTHER): Payer: Self-pay

## 2020-11-14 DIAGNOSIS — Z0289 Encounter for other administrative examinations: Secondary | ICD-10-CM

## 2020-11-14 DIAGNOSIS — Z5181 Encounter for therapeutic drug level monitoring: Secondary | ICD-10-CM

## 2020-11-16 ENCOUNTER — Institutional Professional Consult (permissible substitution): Payer: BC Managed Care – PPO | Admitting: Neurology

## 2020-11-23 ENCOUNTER — Telehealth: Payer: BC Managed Care – PPO | Admitting: Emergency Medicine

## 2020-11-23 DIAGNOSIS — H10023 Other mucopurulent conjunctivitis, bilateral: Secondary | ICD-10-CM | POA: Diagnosis not present

## 2020-11-23 DIAGNOSIS — H1033 Unspecified acute conjunctivitis, bilateral: Secondary | ICD-10-CM

## 2020-11-23 NOTE — Progress Notes (Signed)
Virtual Visit Consent   Deanna Keller, you are scheduled for a virtual visit with a [facility] provider today.     Just as with appointments in the office, your consent must be obtained to participate.  Your consent will be active for this visit and any virtual visit you may have with one of our providers in the next 365 days.     If you have a MyChart account, a copy of this consent can be sent to you electronically.  All virtual visits are billed to your insurance company just like a traditional visit in the office.    As this is a virtual visit, video technology does not allow for your provider to perform a traditional examination.  This may limit your provider's ability to fully assess your condition.  If your provider identifies any concerns that need to be evaluated in person or the need to arrange testing (such as labs, EKG, etc.), we will make arrangements to do so.     Although advances in technology are sophisticated, we cannot ensure that it will always work on either your end or our end.  If the connection with a video visit is poor, the visit may have to be switched to a telephone visit.  With either a video or telephone visit, we are not always able to ensure that we have a secure connection.     I need to obtain your verbal consent now.   Are you willing to proceed with your visit today?    Deanna Keller has provided verbal consent on 11/23/2020 for a virtual visit video.   Deanna Circle, PA-C   Date: 11/23/2020 1:31 PM   Virtual Visit via Video Note   I, Deanna Keller, connected with  Deanna Keller  ([id number], 11-08-1980) on 11/23/20 at  1:30 PM EDT by a video-enabled telemedicine application and verified that I am speaking with the correct person using two identifiers.  Location: Patient: Virtual Visit Location Patient: Home Provider: Virtual Visit Location Provider: Home Office   I discussed the limitations of evaluation and management by telemedicine and the  availability of in person appointments. The patient expressed understanding and agreed to proceed.    History of Present Illness: Deanna Keller is a 40 y.o. who identifies as a female who was assigned female at birth, and is being seen today for chief complaint of eye irritation.  Started 3 days ago.  Started in left eye and spread to the right.  Has been taking Polytrim eye drops.  Has been taking the eye drops for 3 days.  Has been taking zyrtec as well.  Reports associated throat pain, ear pain, and chest pain that started yesterday.  Has had 2 negative covid tests.  HPI: HPI  Problems:  Patient Active Problem List   Diagnosis Date Noted   Moderate episode of recurrent major depressive disorder ([place]) 09/12/2020   Post-seizure headache 09/12/2020   Chronic rhinitis 04/03/2020   Tachycardia 12/28/2018   Anxiety about health 12/28/2018   Insomnia 12/28/2018   Seizure ([place]) 12/28/2018   Frequent infections 06/16/2018   Arthralgia 06/16/2018   Hx of thyroid nodule 02/08/2018   No energy 02/05/2018   Thyromegaly 02/05/2018   Upper back pain 02/05/2018   Weight gain 10/03/2017   Stress reaction 06/16/2017   Generalized anxiety disorder 09/06/2015   IUD check up 11/24/2014   Left lumbar radiculitis 10/11/2014   Generalized seizure ([place]) 08/30/2014   Lumbar degenerative disc disease 06/20/2014   Recovering alcoholic in  remission ([place]) 06/16/2014   S/P gastric bypass 11/11/2013   Vitamin D deficiency 11/11/2013   Depression 11/10/2013   Elevated LFTs 11/10/2013   Chronic migraine 11/10/2013   Malabsorption 11/10/2013   History of alcoholism ([place]) 07/21/2013   B12 deficiency 07/21/2013   Iron deficiency 07/21/2013   Hypoglycemia following gastrointestinal surgery 04/12/2013   Postoperative blind loop syndrome 04/12/2013   Bariatric surgery status in pregnancy 02/10/2013   Phlebectasia 10/21/2011    Allergies:  Allergies  Allergen Reactions   Augmentin [Amoxicillin-Pot  Clavulanate] Nausea And Vomiting   Tramadol Other (See Comments)    Possible seizure   Wellbutrin [Bupropion]     Seizure   Medications:  Current Outpatient Medications:    ALPRAZolam (XANAX) 1 MG tablet, Take 1 tablet (1 mg total) by mouth 2 (two) times daily., Disp: 60 tablet, Rfl: 1   Cyanocobalamin 1000 MCG/ML KIT, Inject 1 mL every two weeks., Disp: 20 kit, Rfl: 2   cyclobenzaprine (FLEXERIL) 10 MG tablet, Take 1 tablet (10 mg total) by mouth 3 (three) times daily as needed for muscle spasms., Disp: 30 tablet, Rfl: 0   gabapentin (NEURONTIN) 100 MG capsule, 1 capsule in the morning, 1 midday, Disp: 180 capsule, Rfl: 1   gabapentin (NEURONTIN) 300 MG capsule, Take 1 capsule (300 mg total) by mouth at bedtime., Disp: 90 capsule, Rfl: 1   Insulin Pen Needle 31G X 6 MM MISC, Use once daily with saxenda daily. (Patient taking differently: Use once with vitamin B12 injections), Disp: 100 each, Rfl: 0   LamoTRIgine (LAMICTAL XR) 200 MG TB24 24 hour tablet, Take 2 tablets (400 mg total) by mouth daily., Disp: 180 tablet, Rfl: 1   venlafaxine XR (EFFEXOR-XR) 37.5 MG 24 hr capsule, Take 1 capsule (37.5 mg total) by mouth daily with breakfast., Disp: 90 capsule, Rfl: 1   zolpidem (AMBIEN) 5 MG tablet, Needs appt TAKE 1 TO 2 TABLETS BY MOUTH AT BEDTIME, Disp: 60 tablet, Rfl: 1  Observations/Objective: Patient is well-developed, well-nourished in no acute distress.  Resting comfortably at home.  Head is normocephalic, atraumatic.  No labored breathing.  Speech is clear and coherent with logical content.  Patient is alert and oriented at baseline.  Bilateral conjunctival erythema  Assessment and Plan: 1. Acute conjunctivitis of both eyes, unspecified acute conjunctivitis type  ? Medication medicamentosa, recommend redirect to [facility] or ophthal for detailed physical exam.  Follow Up Instructions: I discussed the assessment and treatment plan with the patient. The patient was provided an  opportunity to ask questions and all were answered. The patient agreed with the plan and demonstrated an understanding of the instructions.  A copy of instructions were sent to the patient via MyChart.  The patient was advised to call back or seek an in-person evaluation if the symptoms worsen or if the condition fails to improve as anticipated.  Time:  I spent 15 minutes with the patient via telehealth technology discussing the above problems/concerns.    Deanna Circle, PA-C

## 2020-11-23 NOTE — Patient Instructions (Addendum)
Due to your persistent symptoms, I recommend that you be seen in person by your eye doctor or by and urgent care.  Based on what you shared with me, I feel your condition warrants further evaluation and I recommend that you be seen in a face to face visit.   NOTE: There will be NO CHARGE for this Visit   If you are having a true medical emergency please call 911.      For an urgent face to face visit, [facility] has six urgent care centers for your convenience:     [facility] Center at [facility] Directions [phone] [address] [place], [place] [address]    [facility] Health Ur[facility] Care Center [facility]) Get Driving Directions [phone] [address] [place], [place] [address]  [facility] Health Urgent Care Center [facility]) Get Driving Directions [phone] [address] [place],  [place]  [address]  [facility] Health Urgent Care at [facility] Get Driving Directions [phone] [address], [address] [place], [place] [address]   [facility] Health Urgent Care at [facility] Get Driving Directions  [phone] [address] [place], [place] [address]   [facility] Health Urgent Care at [facility] Directions [phone] [address] [place], [place] [address]

## 2020-11-26 DIAGNOSIS — Z20822 Contact with and (suspected) exposure to covid-19: Secondary | ICD-10-CM | POA: Diagnosis not present

## 2020-11-27 ENCOUNTER — Telehealth (INDEPENDENT_AMBULATORY_CARE_PROVIDER_SITE_OTHER): Payer: BC Managed Care – PPO | Admitting: Medical-Surgical

## 2020-11-27 ENCOUNTER — Encounter: Payer: Self-pay | Admitting: Medical-Surgical

## 2020-11-27 ENCOUNTER — Other Ambulatory Visit: Payer: Self-pay

## 2020-11-27 DIAGNOSIS — B349 Viral infection, unspecified: Secondary | ICD-10-CM

## 2020-11-27 MED ORDER — METHYLPREDNISOLONE 4 MG PO TBPK: ORAL_TABLET | ORAL | 0 refills | Status: DC

## 2020-11-27 MED ORDER — BENZONATATE 200 MG PO CAPS: 200.0000 mg | ORAL_CAPSULE | Freq: Three times a day (TID) | ORAL | 0 refills | Status: DC | PRN

## 2020-11-27 NOTE — Progress Notes (Signed)
Virtual Visit via Video Note  I connected with Deanna Keller on 11/27/20 at 10:50 AM EDT by a video enabled telemedicine application and verified that I am speaking with the correct person using two identifiers.   I discussed the limitations of evaluation and management by telemedicine and the availability of in person appointments. The patient expressed understanding and agreed to proceed.  Patient location: home Provider locations: office  Subjective:    CC: Flulike symptoms  HPI: Very pleasant 40 year old female presenting today with complaints of flulike symptoms.  She has had approximately 1 week of symptoms that originally started with what she felt was pinkeye.  She did an E-visit on 8/11 and was told to stop using all of her eyedrops because they suspected her eye redness was from overuse of medications.  At that time, she reports she had only used Polytrim eyedrops for 2 days.  During that ED visit, it was recommended she see an eye doctor.  Her eye doctor was able to evaluate her and prescribed oral Augmentin twice daily with antibiotic eyedrops.  She had been experiencing significant purulent discharge out of her eyes but this has since resolved.  She still wakes with her eyes matted shut.  Since then she has developed sore throat, ear pain/pressure, body aches, chest discomfort, fatigue, diaphoresis with activity, and generalized malaise.  She has been using Zyrtec-D, Advil Cold and Sinus, and the oral antibiotics/eyedrops that she was prescribed.  She is also using Besivance and Alrex as prescribed by her eye doctor.  She has had 1 week of symptoms and tested COVID negative x2 with home test.  [facility] and get [facility]bed for a PCR test.   Past medical history, Surgical history, Family history not pertinant except as noted below, Social history, Allergies, and medications have been entered into the medical record, reviewed, and corrections made.   Review of Systems: See  HPI for pertinent positives and negatives.   Objective:    General: Speaking clearly in complete sentences without any shortness of breath.  Alert and oriented x3.  Normal judgment. No apparent acute distress.  Impression and Recommendations:    1. Viral illness She is already being treated with oral antibiotics as well as antibiotic and steroid eyedrops so recommend continuing these as prescribed.  Recommend waiting for her [facility] COVID test to result.  Symptoms do appear to be viral in nature.  Due to the severity of her symptoms and eye swelling, we will go ahead and treat with methylprednisolone Dosepak.  Also sending in [place] to help with coughing.  Increase fluids and continue symptomatic treatment.  I discussed the assessment and treatment plan with the patient. The patient was provided an opportunity to ask questions and all were answered. The patient agreed with the plan and demonstrated an understanding of the instructions.   The patient was advised to call back or seek an in-person evaluation if the symptoms worsen or if the condition fails to improve as anticipated.  20 minutes of non-face-to-face time was provided during this encounter.  Return if symptoms worsen or fail to improve.  Thayer Ohm, DNP, APRN, FNP-BC [facility]

## 2020-11-27 NOTE — Progress Notes (Signed)
Pink eye for a week, both eyes "Feels like the flu" Covid test x2 negative Head, sinus and chest congestion, body aches Has been to optometry, they gave her antibiotics and steroids

## 2020-11-29 ENCOUNTER — Encounter: Payer: Self-pay | Admitting: Physician Assistant

## 2020-11-29 DIAGNOSIS — E6609 Other obesity due to excess calories: Secondary | ICD-10-CM

## 2020-11-29 DIAGNOSIS — Z6832 Body mass index (BMI) 32.0-32.9, adult: Secondary | ICD-10-CM

## 2020-11-29 DIAGNOSIS — E538 Deficiency of other specified B group vitamins: Secondary | ICD-10-CM

## 2020-11-29 MED ORDER — ZOLPIDEM TARTRATE ER 12.5 MG PO TBCR: 12.5000 mg | EXTENDED_RELEASE_TABLET | Freq: Every evening | ORAL | 0 refills | Status: DC | PRN

## 2020-12-04 ENCOUNTER — Other Ambulatory Visit: Payer: Self-pay | Admitting: Neurology

## 2020-12-04 DIAGNOSIS — F32A Depression, unspecified: Secondary | ICD-10-CM

## 2020-12-04 DIAGNOSIS — Z5181 Encounter for therapeutic drug level monitoring: Secondary | ICD-10-CM

## 2020-12-05 ENCOUNTER — Other Ambulatory Visit: Payer: Self-pay | Admitting: Neurology

## 2020-12-05 MED ORDER — VENLAFAXINE HCL ER 75 MG PO CP24: 75.0000 mg | ORAL_CAPSULE | Freq: Every day | ORAL | 2 refills | Status: DC

## 2020-12-06 ENCOUNTER — Encounter: Payer: Self-pay | Admitting: Neurology

## 2020-12-06 DIAGNOSIS — Z5181 Encounter for therapeutic drug level monitoring: Secondary | ICD-10-CM | POA: Diagnosis not present

## 2020-12-07 ENCOUNTER — Ambulatory Visit (INDEPENDENT_AMBULATORY_CARE_PROVIDER_SITE_OTHER): Payer: BC Managed Care – PPO | Admitting: Neurology

## 2020-12-07 ENCOUNTER — Other Ambulatory Visit: Payer: Self-pay

## 2020-12-07 DIAGNOSIS — G4710 Hypersomnia, unspecified: Secondary | ICD-10-CM | POA: Diagnosis not present

## 2020-12-07 DIAGNOSIS — R351 Nocturia: Secondary | ICD-10-CM

## 2020-12-07 DIAGNOSIS — F419 Anxiety disorder, unspecified: Secondary | ICD-10-CM

## 2020-12-07 DIAGNOSIS — R519 Headache, unspecified: Secondary | ICD-10-CM

## 2020-12-07 DIAGNOSIS — G40909 Epilepsy, unspecified, not intractable, without status epilepticus: Secondary | ICD-10-CM

## 2020-12-07 DIAGNOSIS — G4720 Circadian rhythm sleep disorder, unspecified type: Secondary | ICD-10-CM

## 2020-12-07 DIAGNOSIS — F17200 Nicotine dependence, unspecified, uncomplicated: Secondary | ICD-10-CM

## 2020-12-07 DIAGNOSIS — G4719 Other hypersomnia: Secondary | ICD-10-CM

## 2020-12-08 NOTE — Procedures (Signed)
PATIENT'S NAME:  Deanna Keller, Deanna Keller DOB:      01/30/1981      MR#:    [id number]     DATE OF RECORDING: 12/07/2020 REFERRING M.D.:  Stephanie Acre, MD  Study Performed:   Baseline Polysomnogram  HISTORY: 40 year old woman with a history of seizure disorder, anxiety, alcohol use disorder (in remission), and overweight state, who reports an approximately 2 to 3-year history of daytime somnolence. The patient endorsed the Epworth Sleepiness Scale at 10 points. The patient's weight 180 pounds with a height of 65 (inches), resulting in a BMI of 30.1 kg/m2. The patient's neck circumference measured 12.5 inches.  CURRENT MEDICATIONS: Xanax, Cyanocobalamin, Neurontin, Lamictal, Effexor XR, Ambien CR.   PROCEDURE:  This is a multichannel digital polysomnogram utilizing the Somnostar 11.2 system.  Electrodes and sensors were applied and monitored per AASM Specifications.   EEG, EOG, Chin and Limb EMG, were sampled at 200 Hz.  ECG, Snore and Nasal Pressure, Thermal Airflow, Respiratory Effort, CPAP Flow and Pressure, Oximetry was sampled at 50 Hz. Digital video and audio were recorded.      BASELINE STUDY  The patient took Ambien CR, 12.5 mg. Lights Out was at 22:04 and Lights On at 05:04.  Total recording time (TRT) was 420.5 minutes, with a total sleep time (TST) of 356.5 minutes. The patient's sleep latency was 20.5 minutes.  REM latency was 361 minutes, which is markedly delayed.  The sleep efficiency was 84.8 %.     SLEEP ARCHITECTURE: WASO (Wake after sleep onset) was 41.5 minutes with moderate sleep fragmentation in the second half of the study.  There were 45 minutes in Stage N1, 277 minutes Stage N2, 0 minutes Stage N3 and 34.5 minutes in Stage REM.  The percentage of Stage N1 was 12.6%, which is increased, Stage N2 was 77.7%, which is markedly increased, Stage N3 was absent, and Stage R (REM sleep) was 9.7%, which is reduced. The arousals were noted as: 24 were spontaneous, 0 were associated with PLMs, 0  were associated with respiratory events.  RESPIRATORY ANALYSIS:  There were a total of 0 respiratory events:  0 obstructive apneas, 0 central apneas and 0 mixed apneas with a total of 0 apneas and an apnea index (AI) of 0 /hour. There were 0 hypopneas with a hypopnea index of 0 /hour. The patient also had 0 respiratory event related arousals (RERAs).      The total APNEA/HYPOPNEA INDEX (AHI) was 0/hour and the total RESPIRATORY DISTURBANCE INDEX was  0 /hour.  0 events occurred in REM sleep and 0 events in NREM. The REM AHI was  0 /hour, versus a non-REM AHI of 0. The patient spent 0 minutes of total sleep time in the supine position and 357 minutes in non-supine.. The supine AHI was 0.0 versus a non-supine AHI of 0.0.  OXYGEN SATURATION & C02:  The Wake baseline 02 saturation was 98%, with the lowest being 92%. Time spent below 89% saturation equaled 0 minutes.    PERIODIC LIMB MOVEMENTS: The patient had a total of 57 Periodic Limb Movements.  The Periodic Limb Movement (PLM) index was 9.6 and the PLM Arousal index was 0/hour.  Audio and video analysis did not show any abnormal or unusual movements, behaviors, phonations or vocalizations. The patient took 1 bathroom break. Very mild, intermittent snoring was noted. The EKG was in keeping with normal sinus rhythm (NSR).  Post-study, the patient indicated that sleep was the same as usual.   IMPRESSION:  Dysfunctions associated with  sleep stages or arousal from sleep  RECOMMENDATIONS:  This study does not demonstrate any significant obstructive or central sleep disordered breathing. This study does not support an intrinsic sleep disorder as a cause of the patient's symptoms. Other causes, including circadian rhythm disturbances, an underlying mood disorder, medication effect and/or an underlying medical problem cannot be ruled out.  This study shows sleep fragmentation and abnormal sleep stage percentages; these are nonspecific findings and per  se do not signify an intrinsic sleep disorder or a cause for the patient's sleep-related symptoms. Causes include (but are not limited to) the first night effect of the sleep study, circadian rhythm disturbances, medication effect or an underlying mood disorder or medical problem.  The patient should be cautioned not to drive, work at heights, or operate dangerous or heavy equipment when tired or sleepy. Review and reiteration of good sleep hygiene measures should be pursued with any patient. The patient will be advised to follow up with the referring provider, who will be notified of the test results.  I certify that I have reviewed the entire raw data recording prior to the issuance of this report in accordance with the Standards of Accreditation of the American Academy of Sleep Medicine (AASM)  Huston Foley, MD, PhD Diplomat, American Board of Neurology and Sleep Medicine (Neurology and Sleep Medicine)

## 2020-12-11 ENCOUNTER — Telehealth: Payer: Self-pay | Admitting: Neurology

## 2020-12-11 NOTE — Telephone Encounter (Signed)
I spoke with patient regarding her psychiatry referral. I reached out to Dr. Toni Arthurs but have not gotten an answer/response, his voicemail states he is only meeting over the phone currently. Patient is interested in meeting face-to-face or even zoom. Would you be okay if I sent the referral to [facility] in [place] or to [facility]? Please let me know.  Patient also has concerns about Ambien. She would like for you to know that she is waking up in the night and believes she is only getting about 2 hours of sleep at times.

## 2020-12-11 NOTE — Telephone Encounter (Signed)
Lamotrigine level was therapeutic at 10.9.

## 2020-12-12 NOTE — Telephone Encounter (Signed)
Referral sent to [facility] in [place]. Phone: [phone].

## 2020-12-14 ENCOUNTER — Ambulatory Visit (INDEPENDENT_AMBULATORY_CARE_PROVIDER_SITE_OTHER): Payer: Self-pay | Admitting: Physician Assistant

## 2020-12-14 DIAGNOSIS — Z1329 Encounter for screening for other suspected endocrine disorder: Secondary | ICD-10-CM

## 2020-12-14 DIAGNOSIS — Z5329 Procedure and treatment not carried out because of patient's decision for other reasons: Secondary | ICD-10-CM

## 2020-12-14 DIAGNOSIS — Z13220 Encounter for screening for lipoid disorders: Secondary | ICD-10-CM

## 2020-12-14 DIAGNOSIS — Z0000 Encounter for general adult medical examination without abnormal findings: Secondary | ICD-10-CM

## 2020-12-14 DIAGNOSIS — Z131 Encounter for screening for diabetes mellitus: Secondary | ICD-10-CM

## 2020-12-14 NOTE — Progress Notes (Signed)
No show

## 2020-12-19 MED ORDER — CYANOCOBALAMIN 1000 MCG/ML IJ KIT: PACK | INTRAMUSCULAR | 2 refills | Status: DC

## 2020-12-19 MED ORDER — INSULIN PEN NEEDLE 31G X 6 MM MISC: 0 refills | Status: AC

## 2020-12-19 MED ORDER — CYCLOBENZAPRINE HCL 10 MG PO TABS: 10.0000 mg | ORAL_TABLET | Freq: Three times a day (TID) | ORAL | 0 refills | Status: DC | PRN

## 2020-12-19 NOTE — Addendum Note (Signed)
Addended by: Jomarie Longs on: 12/19/2020 07:16 AM   Modules accepted: Orders

## 2020-12-19 NOTE — Addendum Note (Signed)
Addended by: Chalmers Cater on: 12/19/2020 11:08 AM   Modules accepted: Orders

## 2020-12-26 ENCOUNTER — Encounter: Payer: BC Managed Care – PPO | Admitting: Physician Assistant

## 2021-01-01 ENCOUNTER — Other Ambulatory Visit: Payer: Self-pay | Admitting: Neurology

## 2021-01-04 ENCOUNTER — Other Ambulatory Visit: Payer: Self-pay | Admitting: Physician Assistant

## 2021-01-04 DIAGNOSIS — R569 Unspecified convulsions: Secondary | ICD-10-CM

## 2021-01-07 ENCOUNTER — Other Ambulatory Visit: Payer: Self-pay | Admitting: Neurology

## 2021-01-08 ENCOUNTER — Other Ambulatory Visit: Payer: Self-pay

## 2021-01-08 ENCOUNTER — Other Ambulatory Visit: Payer: Self-pay | Admitting: Neurology

## 2021-01-08 ENCOUNTER — Ambulatory Visit (INDEPENDENT_AMBULATORY_CARE_PROVIDER_SITE_OTHER): Payer: BC Managed Care – PPO | Admitting: Physician Assistant

## 2021-01-08 VITALS — BP 117/72 | HR 80 | Ht 65.0 in | Wt 180.0 lb

## 2021-01-08 DIAGNOSIS — F32A Depression, unspecified: Secondary | ICD-10-CM

## 2021-01-08 DIAGNOSIS — Z9884 Bariatric surgery status: Secondary | ICD-10-CM

## 2021-01-08 DIAGNOSIS — Z13220 Encounter for screening for lipoid disorders: Secondary | ICD-10-CM

## 2021-01-08 DIAGNOSIS — Z23 Encounter for immunization: Secondary | ICD-10-CM

## 2021-01-08 DIAGNOSIS — Z0000 Encounter for general adult medical examination without abnormal findings: Secondary | ICD-10-CM | POA: Diagnosis not present

## 2021-01-08 DIAGNOSIS — H538 Other visual disturbances: Secondary | ICD-10-CM

## 2021-01-08 DIAGNOSIS — Z131 Encounter for screening for diabetes mellitus: Secondary | ICD-10-CM | POA: Diagnosis not present

## 2021-01-08 DIAGNOSIS — G43911 Migraine, unspecified, intractable, with status migrainosus: Secondary | ICD-10-CM

## 2021-01-08 DIAGNOSIS — F419 Anxiety disorder, unspecified: Secondary | ICD-10-CM

## 2021-01-08 MED ORDER — VENLAFAXINE HCL ER 150 MG PO CP24: 150.0000 mg | ORAL_CAPSULE | Freq: Every day | ORAL | 1 refills | Status: DC

## 2021-01-08 MED ORDER — CYCLOBENZAPRINE HCL 10 MG PO TABS: 10.0000 mg | ORAL_TABLET | Freq: Three times a day (TID) | ORAL | 1 refills | Status: DC | PRN

## 2021-01-08 MED ORDER — ALPRAZOLAM 1 MG PO TABS: 1.0000 mg | ORAL_TABLET | Freq: Two times a day (BID) | ORAL | 2 refills | Status: DC

## 2021-01-08 NOTE — Patient Instructions (Signed)
Health Maintenance, Female Adopting a healthy lifestyle and getting preventive care are important in promoting health and wellness. Ask your health care provider about: The right schedule for you to have regular tests and exams. Things you can do on your own to prevent diseases and keep yourself healthy. What should I know about diet, weight, and exercise? Eat a healthy diet  Eat a diet that includes plenty of vegetables, fruits, low-fat dairy products, and lean protein. Do not eat a lot of foods that are high in solid fats, added sugars, or sodium. Maintain a healthy weight Body mass index (BMI) is used to identify weight problems. It estimates body fat based on height and weight. Your health care provider can help determine your BMI and help you achieve or maintain a healthy weight. Get regular exercise Get regular exercise. This is one of the most important things you can do for your health. Most adults should: Exercise for at least 150 minutes each week. The exercise should increase your heart rate and make you sweat (moderate-intensity exercise). Do strengthening exercises at least twice a week. This is in addition to the moderate-intensity exercise. Spend less time sitting. Even light physical activity can be beneficial. Watch cholesterol and blood lipids Have your blood tested for lipids and cholesterol at 38 years of age, then have this test every 5 years. Have your cholesterol levels checked more often if: Your lipid or cholesterol levels are high. You are older than 38 years of age. You are at high risk for heart disease. What should I know about cancer screening? Depending on your health history and family history, you may need to have cancer screening at various ages. This may include screening for: Breast cancer. Cervical cancer. Colorectal cancer. Skin cancer. Lung cancer. What should I know about heart disease, diabetes, and high blood pressure? Blood pressure and heart  disease High blood pressure causes heart disease and increases the risk of stroke. This is more likely to develop in people who have high blood pressure readings, are of African descent, or are overweight. Have your blood pressure checked: Every 3-5 years if you are 18-39 years of age. Every year if you are 40 years old or older. Diabetes Have regular diabetes screenings. This checks your fasting blood sugar level. Have the screening done: Once every three years after age 40 if you are at a normal weight and have a low risk for diabetes. More often and at a younger age if you are overweight or have a high risk for diabetes. What should I know about preventing infection? Hepatitis B If you have a higher risk for hepatitis B, you should be screened for this virus. Talk with your health care provider to find out if you are at risk for hepatitis B infection. Hepatitis C Testing is recommended for: Everyone born from 1945 through 1965. Anyone with known risk factors for hepatitis C. Sexually transmitted infections (STIs) Get screened for STIs, including gonorrhea and chlamydia, if: You are sexually active and are younger than 38 years of age. You are older than 38 years of age and your health care provider tells you that you are at risk for this type of infection. Your sexual activity has changed since you were last screened, and you are at increased risk for chlamydia or gonorrhea. Ask your health care provider if you are at risk. Ask your health care provider about whether you are at high risk for HIV. Your health care provider may recommend a prescription medicine   to help prevent HIV infection. If you choose to take medicine to prevent HIV, you should first get tested for HIV. You should then be tested every 3 months for as long as you are taking the medicine. Pregnancy If you are about to stop having your period (premenopausal) and you may become pregnant, seek counseling before you get  pregnant. Take 400 to 800 micrograms (mcg) of folic acid every day if you become pregnant. Ask for birth control (contraception) if you want to prevent pregnancy. Osteoporosis and menopause Osteoporosis is a disease in which the bones lose minerals and strength with aging. This can result in bone fractures. If you are 65 years old or older, or if you are at risk for osteoporosis and fractures, ask your health care provider if you should: Be screened for bone loss. Take a calcium or vitamin D supplement to lower your risk of fractures. Be given hormone replacement therapy (HRT) to treat symptoms of menopause. Follow these instructions at home: Lifestyle Do not use any products that contain nicotine or tobacco, such as cigarettes, e-cigarettes, and chewing tobacco. If you need help quitting, ask your health care provider. Do not use street drugs. Do not share needles. Ask your health care provider for help if you need support or information about quitting drugs. Alcohol use Do not drink alcohol if: Your health care provider tells you not to drink. You are pregnant, may be pregnant, or are planning to become pregnant. If you drink alcohol: Limit how much you use to 0-1 drink a day. Limit intake if you are breastfeeding. Be aware of how much alcohol is in your drink. In the [place] one drink equals one 12 oz bottle of beer (355 mL), one 5 oz glass of wine (148 mL), or one 1 oz glass of hard liquor (44 mL). General instructions Schedule regular health, dental, and eye exams. Stay current with your vaccines. Tell your health care provider if: You often feel depressed. You have ever been abused or do not feel safe at home. Summary Adopting a healthy lifestyle and getting preventive care are important in promoting health and wellness. Follow your health care provider's instructions about healthy diet, exercising, and getting tested or screened for diseases. Follow your health care provider's  instructions on monitoring your cholesterol and blood pressure. This information is not intended to replace advice given to you by your health care provider. Make sure you discuss any questions you have with your health care provider. Document Revised: 06/09/2020 Document Reviewed: 03/25/2018 Elsevier Patient Education  2022 Elsevier Inc.  

## 2021-01-08 NOTE — Progress Notes (Signed)
Subjective:     Deanna Keller is a 40 y.o. female and is here for a comprehensive physical exam. The patient reports problems - she continues to feel very tired and having intermittent blurry vision. She has seen her eye doctor and recommended A1C testing .   She has not had seizure since her decrease in lamictal. Neurology has increased gabapentin 300mg  TID and effexor 150mg .   Hx of multinodular goiter in 2013. Last u/s 2020 decrease in size and no dominant nodules.   Social History   Socioeconomic History   Marital status: Married    Spouse name: Not on file   Number of children: Not on file   Years of education: Not on file   Highest education level: Not on file  Occupational History   Not on file  Tobacco Use   Smoking status: Former    Packs/day: 0.50    Years: 10.00    Pack years: 5.00    Types: E-cigarettes, Cigarettes    Start date: 07/21/2012   Smokeless tobacco: Current   Tobacco comments:    uses nicotine pouches sometimes   Vaping Use   Vaping Use: Never used  Substance and Sexual Activity   Alcohol use: No   Drug use: No   Sexual activity: Yes    Partners: Male  Other Topics Concern   Not on file  Social History Narrative   Right handed   Caffeine maybe 1 cup/day   Lives at home with husband and 90 year old son    Social Determinants of Health   Financial Resource Strain: Not on file  Food Insecurity: Not on file  Transportation Needs: Not on file  Physical Activity: Not on file  Stress: Not on file  Social Connections: Not on file  Intimate Partner Violence: Not on file   Health Maintenance  Topic Date Due   Hepatitis C Screening  04/03/2021 (Originally 05/14/2000)   HIV Screening  04/03/2021 (Originally 05/14/1997)   PAP SMEAR-Modifier  03/28/2022   TETANUS/TDAP  07/14/2023   INFLUENZA VACCINE  Completed   COVID-19 Vaccine  Completed   HPV VACCINES  Aged Out    The following portions of the patient's history were reviewed and updated as  appropriate: allergies, current medications, past family history, past medical history, past social history, past surgical history, and problem list.  Review of Systems Pertinent items noted in HPI and remainder of comprehensive ROS otherwise negative.   Objective:    BP 117/72   Pulse 80   Ht 5\' 5"  (1.651 m)   Wt 180 lb (81.6 kg)   SpO2 100%   BMI 29.95 kg/m  General appearance: alert, cooperative, and appears stated age Head: Normocephalic, without obvious abnormality, atraumatic Eyes: conjunctivae/corneas clear. PERRL, EOM's intact. Fundi benign. Ears: normal TM's and external ear canals both ears Nose: Nares normal. Septum midline. Mucosa normal. No drainage or sinus tenderness. Throat: lips, mucosa, and tongue normal; teeth and gums normal Neck: no adenopathy, no carotid bruit, no JVD, supple, symmetrical, trachea midline, thyroid: enlarged, and thyroid not enlarged, symmetric, no tenderness/mass/nodules Back: symmetric, no curvature. ROM normal. No CVA tenderness. Lungs: clear to auscultation bilaterally Heart: regular rate and rhythm, S1, S2 normal, no murmur, click, rub or gallop Abdomen: soft, non-tender; bowel sounds normal; no masses,  no organomegaly Extremities: extremities normal, atraumatic, no cyanosis or edema Pulses: 2+ and symmetric Skin: Skin color, texture, turgor normal. No rashes or lesions Lymph nodes: Cervical, supraclavicular, and axillary nodes normal. Neurologic: Grossly normal   ..  Depression screen [facility] 2/9 01/08/2021 04/03/2020 09/17/2019 07/26/2019 11/13/2018  Decreased Interest 1 1 2 3  0  Down, Depressed, Hopeless 1 1 2 3 1   PHQ - 2 Score 2 2 4 6 1   Altered sleeping 2 0 0 3 3  Tired, decreased energy 2 2 2 3 3   Change in appetite 2 0 0 0 1  Feeling bad or failure about yourself  0 2 0 1 0  Trouble concentrating 0 1 0 3 3  Moving slowly or fidgety/restless 0 0 0 0 1  Suicidal thoughts 0 0 0 0 0  PHQ-9 Score 8 7 6 16 12   Difficult doing work/chores  Somewhat difficult - Very difficult Extremely dIfficult Somewhat difficult  Some encounter information is confidential and restricted. Go to Review Flowsheets activity to see all data.  Some recent data might be hidden   . GAD 7 : Generalized Anxiety Score 01/08/2021 04/03/2020 09/17/2019 07/26/2019  Nervous, Anxious, on Edge 3 3 3 3   Control/stop worrying 2 3 2 3   Worry too much - different things 2 3 2  0  Trouble relaxing 3 2 2 3   Restless 3 0 0 3  Easily annoyed or irritable 3 2 1 3   Afraid - awful might happen 0 0 0 1  Total GAD 7 Score 16 13 10 16   Anxiety Difficulty Somewhat difficult - Very difficult Very difficult  Some encounter information is confidential and restricted. Go to Review Flowsheets activity to see all data.     Assessment:    Healthy female exam.     Plan:    [name]9/28/2022[name] was seen today for annual exam.  Diagnoses and all orders for this visit:  Routine physical examination -     B12 and Folate Panel -     VITAMIN D 25 Hydroxy (Vit-D Deficiency, Fractures) -     Zinc -     Magnesium -     COMPLETE METABOLIC PANEL WITH GFR -     Lipid Panel w/reflex Direct LDL -     TSH + free T4 -     Hemoglobin A1c -     CBC with Differential/Platelet  Flu vaccine need -     Flu Vaccine QUAD 9mo+IM (Fluarix, Fluzone & Alfiuria Quad PF)  S/P gastric bypass -     B12 and Folate Panel -     VITAMIN D 25 Hydroxy (Vit-D Deficiency, Fractures) -     Zinc -     Magnesium -     COMPLETE METABOLIC PANEL WITH GFR -     Lipid Panel w/reflex Direct LDL -     TSH + free T4 -     Hemoglobin A1c -     CBC with Differential/Platelet  Screening for diabetes mellitus -     COMPLETE METABOLIC PANEL WITH GFR  Screening for lipid disorders -     Lipid Panel w/reflex Direct LDL  Blurry vision -     Hemoglobin A1c  Intractable migraine with status migrainosus, unspecified migraine type -     cyclobenzaprine (FLEXERIL) 10 MG tablet; Take 1 tablet (10 mg total) by mouth 3  (three) times daily as needed for muscle spasms.  .. Discussed 150 minutes of exercise a week.  Encouraged vitamin D 1000 units and Calcium 1300mg  or 4 servings of dairy a day.  PHQ/GAD not to goal. Working with [facility].  Pap UTD.  Fasting labs ordered.  Recheck TSH.  Covid vaccine x3.  Encouraged flu shot.  Refilled flexeril for migraine prevention.   Continue to follow closely with neurology for seizures and migraines.  See After Visit Summary for Counseling Recommendations

## 2021-01-15 NOTE — Telephone Encounter (Signed)
Referral sent to [facility] in [place]. Phone: [phone]. Fax: [phone].

## 2021-01-18 ENCOUNTER — Other Ambulatory Visit: Payer: Self-pay | Admitting: Physician Assistant

## 2021-01-18 DIAGNOSIS — E6609 Other obesity due to excess calories: Secondary | ICD-10-CM

## 2021-01-22 ENCOUNTER — Encounter: Payer: Self-pay | Admitting: Physician Assistant

## 2021-01-24 ENCOUNTER — Ambulatory Visit: Payer: BC Managed Care – PPO | Admitting: Adult Health

## 2021-02-06 ENCOUNTER — Encounter: Payer: Self-pay | Admitting: Neurology

## 2021-02-06 ENCOUNTER — Ambulatory Visit (INDEPENDENT_AMBULATORY_CARE_PROVIDER_SITE_OTHER): Payer: BC Managed Care – PPO | Admitting: Neurology

## 2021-02-06 ENCOUNTER — Other Ambulatory Visit: Payer: Self-pay

## 2021-02-06 VITALS — BP 117/8 | HR 86 | Ht 65.0 in | Wt 186.0 lb

## 2021-02-06 DIAGNOSIS — G40909 Epilepsy, unspecified, not intractable, without status epilepticus: Secondary | ICD-10-CM | POA: Diagnosis not present

## 2021-02-06 DIAGNOSIS — Z5181 Encounter for therapeutic drug level monitoring: Secondary | ICD-10-CM | POA: Diagnosis not present

## 2021-02-06 NOTE — Progress Notes (Signed)
[facility]  PATIENT: Deanna Keller DOB: 09-Dec-1980  REFERRING CLINICIAN: Donella Stade, PA-C HISTORY FROM: Patient  REASON FOR VISIT: Seizure    HISTORICAL  CHIEF COMPLAINT:  Chief Complaint  Patient presents with   Follow-up    Rm 12, alone, States she is stable, reports no seizures, would like to have levels checked today, states she is having a hard time getting establish with a psychiatrist, needs new referral for this      INTERVAL HISTORY 02/06/2021 Patient presents today for follow-up, last visit was on June 2022. At that time she reported 2 additional seizures after her lamotrigine was decreased from 400 mg a day to 300 mg a day.  Since then her lamotrigine has been increased back to 400 mg daily.  Since her last visit she denies any additional seizures.  She reported that her mood is better, sleep is better, she cut down on her caffeine and cut down on her work.  Currently she does not have any complaint or concern but just wanted to know the level of Lamictal and gabapentin.  Last seizure was May 2022   Initial history from Dr Jannifer Franklin:  Ms. Deanna Keller is a 40 year old right-handed white female with a history of seizures 4 years ago while taking Ultram.  She had 2 seizures at that time, she was placed on Lamictal, she claims she had an EEG study and she believes she had an MRI but she is not sure, the records support that she had a noncontrast CT scan of the head for a concussion in 2013.  The patient was placed on Lamictal, she has been on 300 mg a day of this medication since that time but she does not always take the medication properly, she misses doses frequently.  She was placed on Wellbutrin for weight loss, 8 days ago she suffered another seizure, she was in the car with her husband who was driving and the patient had a witnessed event.  The patient started with a confusional event, she was talking nonsense and then went into a stiffening event, she did  not bite her tongue or lose control of bowels or the bladder.  The event lasted about 1 minute.  The patient had no recollection of events during that time.  She has been increased on the Lamictal taking 200 mg twice daily.  She was taken off of the Wellbutrin.  She is sent to this office for an evaluation.  She denies any headaches, dizziness, vision changes, or any numbness or weakness of the face, arms, legs.  She denies any balance problems.  She does have a prior history of alcohol abuse but she has not had anything to drink in 8 years.  She denies any illicit drug use such as cocaine or marijuana.  She does use Xanax in low-dose, taking 0.5 mg twice daily.  The patient does report chronic fatigue issues and daytime drowsiness.   Handedness: Right handed   Seizure Type: passing out, being stiff, clinched teeth, unconscious   Current frequency: Last seizure in May 2022  Any injuries from seizures: None   Seizure risk factors: None   Previous ASMs: Lamotrigine, Gabapentin   Currenty ASMs: Lamotrigine 400 mg daily and Gabapentin 100 mg TID   ASMs side effects: None   Brain Images: Nonspecific white matter hyperintensities  Previous EEGs: Normal routine EEG   OTHER MEDICAL CONDITIONS: Anxiety/Depression, Seizure, Gastric bypass surgery   REVIEW OF SYSTEMS: Full 14 system review of systems performed  and negative with exception of: as noted in the HPI.  ALLERGIES: Allergies  Allergen Reactions   Augmentin [Amoxicillin-Pot Clavulanate] Nausea And Vomiting   Tramadol Other (See Comments)    Possible seizure   Wellbutrin [Bupropion]     Seizure    HOME MEDICATIONS: Outpatient Medications Prior to Visit  Medication Sig Dispense Refill   ALPRAZolam (XANAX) 1 MG tablet Take 1 tablet (1 mg total) by mouth 2 (two) times daily. 60 tablet 2   BD INSULIN SYRINGE U/F 31G X 5/16" 1 ML MISC USE ONCE WITH VITAMIN B12 INJECTIONS 100 each 12   Cyanocobalamin 1000 MCG/ML KIT Inject 1 mL every  two weeks. 20 kit 2   cyclobenzaprine (FLEXERIL) 10 MG tablet Take 1 tablet (10 mg total) by mouth 3 (three) times daily as needed for muscle spasms. 90 tablet 1   gabapentin (NEURONTIN) 300 MG capsule Take 1 capsule (300 mg total) by mouth at bedtime. 90 capsule 1   Insulin Pen Needle 31G X 6 MM MISC Use once with vitamin B12 injections 100 each 0   LamoTRIgine (LAMICTAL XR) 200 MG TB24 24 hour tablet Take 2 tablets (400 mg total) by mouth daily. 180 tablet 1   venlafaxine XR (EFFEXOR XR) 150 MG 24 hr capsule Take 1 capsule (150 mg total) by mouth daily with breakfast. 90 capsule 1   No facility-administered medications prior to visit.    PAST MEDICAL HISTORY: Past Medical History:  Diagnosis Date   Alcohol abuse    sober since 2011   Anxiety    Seizure disorder ([place]) 12/28/2018    PAST SURGICAL HISTORY: Past Surgical History:  Procedure Laterality Date   CERVICAL CONE BIOPSY     GASTRIC BYPASS  2005    FAMILY HISTORY: Family History  Problem Relation Age of Onset   Colon cancer Mother    Depression Mother    Hypertension Father     SOCIAL HISTORY: Social History   Socioeconomic History   Marital status: Married    Spouse name: Not on file   Number of children: Not on file   Years of education: Not on file   Highest education level: Not on file  Occupational History   Not on file  Tobacco Use   Smoking status: Former    Packs/day: 0.50    Years: 10.00    Pack years: 5.00    Types: E-cigarettes, Cigarettes    Start date: 07/21/2012   Smokeless tobacco: Current   Tobacco comments:    uses nicotine pouches sometimes   Vaping Use   Vaping Use: Never used  Substance and Sexual Activity   Alcohol use: No   Drug use: No   Sexual activity: Yes    Partners: Male  Other Topics Concern   Not on file  Social History Narrative   Right handed   Caffeine maybe 1 cup/day   Lives at home with husband and 74 year old son    Social Determinants of Health   Financial  Resource Strain: Not on file  Food Insecurity: Not on file  Transportation Needs: Not on file  Physical Activity: Not on file  Stress: Not on file  Social Connections: Not on file  Intimate Partner Violence: Not on file    PHYSICAL EXAM   GENERAL EXAM/CONSTITUTIONAL: Vitals:  Vitals:   02/06/21 1254  BP: (!) 117/8  Pulse: 86  Weight: 186 lb (84.4 kg)  Height: _0  (1.651 m)   Body mass index is 30.95 kg/m.  Wt Readings from Last 3 Encounters:  02/06/21 186 lb (84.4 kg)  01/08/21 180 lb (81.6 kg)  10/05/20 180 lb 5 oz (81.8 kg)   Patient is in no distress; well developed, nourished and groomed; neck is supple  EYES: Pupils round and reactive to light, Visual fields full to confrontation, Extraocular movements intacts,  No results found.  MUSCULOSKELETAL: Gait, strength, tone, movements noted in Neurologic exam below  NEUROLOGIC: MENTAL STATUS:  No flowsheet data found. awake, alert, oriented to person, place and time recent and remote memory intact normal attention and concentration language fluent, comprehension intact, naming intact fund of knowledge appropriate  CRANIAL NERVE:  2nd, 3rd, 4th, 6th - pupils equal and reactive to light, visual fields full to confrontation, extraocular muscles intact, no nystagmus 5th - facial sensation symmetric 7th - facial strength symmetric 8th - hearing intact 9th - palate elevates symmetrically, uvula midline 11th - shoulder shrug symmetric 12th - tongue protrusion midline  MOTOR:  normal bulk and tone, full strength in the BUE, BLE  SENSORY:  normal and symmetric to light touch  COORDINATION:  finger-nose-finger, fine finger movements normal  REFLEXES:  deep tendon reflexes present and symmetric  GAIT/STATION:  normal    DIAGNOSTIC DATA (LABS, IMAGING, TESTING) - I reviewed patient records, labs, notes, testing and imaging myself where available.  Lab Results  Component Value Date   WBC 3.6 (L)  04/05/2020   HGB 13.4 04/05/2020   HCT 39.7 04/05/2020   MCV 92.8 04/05/2020   PLT 292 04/05/2020      Component Value Date/Time   NA 139 04/05/2020 0711   K 4.2 04/05/2020 0711   CL 103 04/05/2020 0711   CO2 30 04/05/2020 0711   GLUCOSE 87 04/05/2020 0711   BUN 9 04/05/2020 0711   BUN 7 04/19/2013 0000   CREATININE 0.71 04/05/2020 0711   CALCIUM 9.1 04/05/2020 0711   CALCIUM 9.0 04/19/2013 0000   PROT 6.7 04/05/2020 0711   ALBUMIN 3.8 04/19/2013 0000   AST 28 04/05/2020 0711   ALT 36 (H) 04/05/2020 0711   ALKPHOS 67 04/19/2013 0000   BILITOT 0.4 04/05/2020 0711   GFRNONAA 109 04/05/2020 0711   GFRAA 126 04/05/2020 0711   Lab Results  Component Value Date   CHOL 177 04/05/2020   HDL 82 04/05/2020   LDLCALC 82 04/05/2020   TRIG 57 04/05/2020   No results found for: HGBA1C Lab Results  Component Value Date   VITAMINB12 929 06/03/2018   Lab Results  Component Value Date   TSH 1.63 12/25/2018     MRI BRAIN 2020 Slightly abnormal MRI scan of the brain showing few scattered supratentorial and right pontine nonspecific white matter hyperintensities likely it is due to small vessel disease.  No enhancing lesions are noted.   EEG 2020 This is a normal EEG recording in the waking state. No evidence of ictal or interictal discharges are seen.  I personally reviewed brain Images and previous EEG reports.   ASSESSMENT AND PLAN  40 y.o. year old female  with past medical history of anxiety/depression, gastric bypass surgery and seizure who is presenting for follow-up.  Patient overall states that she is doing much better after the lamotrigine was increased to 400 mg daily.  Her last seizure was in May 2022.  Currently she does not have any concern or complaint.  We will check a lamotrigine level and see her in 6 months for follow-up.  No change in the lamotrigine dose and gabapentin dose.  1. Seizure disorder ([place])     PLAN: Continue with Lamotrigine 400 mg daily   Will check Lamotrigine level today  Follow up in 6 months or sooner if worse   Per [facility] statutes, patients with seizures are not allowed to drive until they have been seizure-free for six months.  Other recommendations include using caution when using heavy equipment or power tools. Avoid working on ladders or at heights. Take showers instead of baths.  Do not swim alone.  Ensure the water temperature is not too high on the home water heater. Do not go swimming alone. Do not lock yourself in a room alone (i.e. bathroom). When caring for infants or small children, sit down when holding, feeding, or changing them to minimize risk of injury to the child in the event you have a seizure. Maintain good sleep hygiene. Avoid alcohol.  Also recommend adequate sleep, hydration, good diet and minimize stress.   During the Seizure  - First, ensure adequate ventilation and place patients on the floor on their left side  Loosen clothing around the neck and ensure the airway is patent. If the patient is clenching the teeth, do not force the mouth open with any object as this can cause severe damage - Remove all items from the surrounding that can be hazardous. The patient may be oblivious to what's happening and may not even know what he or she is doing. If the patient is confused and wandering, either gently guide him/her away and block access to outside areas - Reassure the individual and be comforting - Call 911. In most cases, the seizure ends before EMS arrives. However, there are cases when seizures may last over 3 to 5 minutes. Or the individual may have developed breathing difficulties or severe injuries. If a pregnant patient or a person with diabetes develops a seizure, it is prudent to call an ambulance. - Finally, if the patient does not regain full consciousness, then call EMS. Most patients will remain confused for about 45 to 90 minutes after a seizure, so you must use judgment in  calling for help. - Avoid restraints but make sure the patient is in a bed with padded side rails - Place the individual in a lateral position with the neck slightly flexed; this will help the saliva drain from the mouth and prevent the tongue from falling backward - Remove all nearby furniture and other hazards from the area - Provide verbal assurance as the individual is regaining consciousness - Provide the patient with privacy if possible - Call for help and start treatment as ordered by the caregiver   After the Seizure (Postictal Stage)  After a seizure, most patients experience confusion, fatigue, muscle pain and/or a headache. Thus, one should permit the individual to sleep. For the next few days, reassurance is essential. Being calm and helping reorient the person is also of importance.  Most seizures are painless and end spontaneously. Seizures are not harmful to others but can lead to complications such as stress on the lungs, brain and the heart. Individuals with prior lung problems may develop labored breathing and respiratory distress.     Orders Placed This Encounter  Procedures   Lamotrigine level    No orders of the defined types were placed in this encounter.   Return in about 6 months (around 08/07/2021).    Alric Ran, MD 02/06/2021, 7:47 PM  [facility] [address] [place], [place] [address] [phone]

## 2021-02-06 NOTE — Patient Instructions (Signed)
Continue with Lamotrigine 400 mg daily  Will check Lamotrigine level today  Follow up in 6 months or sooner if worse

## 2021-02-07 LAB — LAMOTRIGINE LEVEL: Lamotrigine Lvl: 9.5 ug/mL (ref 2.0–20.0)

## 2021-02-12 ENCOUNTER — Other Ambulatory Visit: Payer: Self-pay | Admitting: Neurology

## 2021-02-16 ENCOUNTER — Other Ambulatory Visit: Payer: Self-pay | Admitting: Medical-Surgical

## 2021-02-20 DIAGNOSIS — Z0000 Encounter for general adult medical examination without abnormal findings: Secondary | ICD-10-CM | POA: Diagnosis not present

## 2021-02-20 DIAGNOSIS — H538 Other visual disturbances: Secondary | ICD-10-CM | POA: Diagnosis not present

## 2021-02-20 DIAGNOSIS — Z13220 Encounter for screening for lipoid disorders: Secondary | ICD-10-CM | POA: Diagnosis not present

## 2021-02-20 DIAGNOSIS — Z131 Encounter for screening for diabetes mellitus: Secondary | ICD-10-CM | POA: Diagnosis not present

## 2021-02-21 ENCOUNTER — Other Ambulatory Visit: Payer: Self-pay | Admitting: Physician Assistant

## 2021-02-21 ENCOUNTER — Encounter: Payer: Self-pay | Admitting: Physician Assistant

## 2021-02-21 NOTE — Progress Notes (Signed)
Deanna Keller,   B12 looks great.  Vitamin D normal range. Stay on daily vitamin D.  Magnesium normal.  Thyroid looks great.  A1C looks great.  WBC on low side. RBC, hemoglobin and platelets all look good though.  HDL, good cholesterol, looks fabulous.  LDL, bad cholesterol, up some but still looks good.  Kidney function looks great.   Liver enzymes are elevated a bit more than last rechecks but not terrible. Does GI follow these?  You are not drinking any alcohol correct? Avoid tylenol products

## 2021-02-22 LAB — CBC WITH DIFFERENTIAL/PLATELET
Absolute Monocytes: 388 cells/uL (ref 200–950)
Basophils Absolute: 41 cells/uL (ref 0–200)
Basophils Relative: 1.2 %
Eosinophils Absolute: 197 cells/uL (ref 15–500)
Eosinophils Relative: 5.8 %
HCT: 39.9 % (ref 35.0–45.0)
Hemoglobin: 13.1 g/dL (ref 11.7–15.5)
Lymphs Abs: 1353 cells/uL (ref 850–3900)
MCH: 30.3 pg (ref 27.0–33.0)
MCHC: 32.8 g/dL (ref 32.0–36.0)
MCV: 92.1 fL (ref 80.0–100.0)
MPV: 9.9 fL (ref 7.5–12.5)
Monocytes Relative: 11.4 %
Neutro Abs: 1421 cells/uL — ABNORMAL LOW (ref 1500–7800)
Neutrophils Relative %: 41.8 %
Platelets: 326 10*3/uL (ref 140–400)
RBC: 4.33 10*6/uL (ref 3.80–5.10)
RDW: 12.8 % (ref 11.0–15.0)
Total Lymphocyte: 39.8 %
WBC: 3.4 10*3/uL — ABNORMAL LOW (ref 3.8–10.8)

## 2021-02-22 LAB — COMPLETE METABOLIC PANEL WITH GFR
AG Ratio: 1.6 (calc) (ref 1.0–2.5)
ALT: 91 U/L — ABNORMAL HIGH (ref 6–29)
AST: 62 U/L — ABNORMAL HIGH (ref 10–30)
Albumin: 4.4 g/dL (ref 3.6–5.1)
Alkaline phosphatase (APISO): 70 U/L (ref 31–125)
BUN: 13 mg/dL (ref 7–25)
CO2: 29 mmol/L (ref 20–32)
Calcium: 9.5 mg/dL (ref 8.6–10.2)
Chloride: 99 mmol/L (ref 98–110)
Creat: 0.54 mg/dL (ref 0.50–0.97)
Globulin: 2.7 g/dL (calc) (ref 1.9–3.7)
Glucose, Bld: 93 mg/dL (ref 65–99)
Potassium: 5 mmol/L (ref 3.5–5.3)
Sodium: 136 mmol/L (ref 135–146)
Total Bilirubin: 0.5 mg/dL (ref 0.2–1.2)
Total Protein: 7.1 g/dL (ref 6.1–8.1)
eGFR: 121 mL/min/{1.73_m2} (ref >=60)

## 2021-02-22 LAB — HEMOGLOBIN A1C
Hgb A1c MFr Bld: 4.9 % of total Hgb (ref <5.7)
Mean Plasma Glucose: 94 mg/dL
eAG (mmol/L): 5.2 mmol/L

## 2021-02-22 LAB — LIPID PANEL W/REFLEX DIRECT LDL
Cholesterol: 205 mg/dL — ABNORMAL HIGH (ref <200)
HDL: 92 mg/dL (ref >=50)
LDL Cholesterol (Calc): 100 mg/dL (calc) — ABNORMAL HIGH
Non-HDL Cholesterol (Calc): 113 mg/dL (calc) (ref <130)
Total CHOL/HDL Ratio: 2.2 (calc) (ref <5.0)
Triglycerides: 45 mg/dL (ref <150)

## 2021-02-22 LAB — VITAMIN D 25 HYDROXY (VIT D DEFICIENCY, FRACTURES): Vit D, 25-Hydroxy: 43 ng/mL (ref 30–100)

## 2021-02-22 LAB — ZINC: Zinc: 63 ug/dL (ref 60–130)

## 2021-02-22 LAB — B12 AND FOLATE PANEL
Folate: 13.3 ng/mL
Vitamin B-12: 962 pg/mL (ref 200–1100)

## 2021-02-22 LAB — TSH+FREE T4: TSH W/REFLEX TO FT4: 1.87 mIU/L

## 2021-02-22 LAB — MAGNESIUM: Magnesium: 1.9 mg/dL (ref 1.5–2.5)

## 2021-02-23 NOTE — Progress Notes (Signed)
Zinc low normal. Could take 50mg  daily.

## 2021-03-05 ENCOUNTER — Other Ambulatory Visit: Payer: Self-pay

## 2021-03-05 ENCOUNTER — Encounter: Payer: Self-pay | Admitting: Physician Assistant

## 2021-03-05 ENCOUNTER — Ambulatory Visit (INDEPENDENT_AMBULATORY_CARE_PROVIDER_SITE_OTHER): Payer: BC Managed Care – PPO

## 2021-03-05 ENCOUNTER — Ambulatory Visit (INDEPENDENT_AMBULATORY_CARE_PROVIDER_SITE_OTHER): Payer: BC Managed Care – PPO | Admitting: Physician Assistant

## 2021-03-05 VITALS — BP 101/60 | HR 91 | Ht 65.0 in | Wt 190.0 lb

## 2021-03-05 DIAGNOSIS — W19XXXA Unspecified fall, initial encounter: Secondary | ICD-10-CM

## 2021-03-05 DIAGNOSIS — M7918 Myalgia, other site: Secondary | ICD-10-CM

## 2021-03-05 DIAGNOSIS — M545 Low back pain, unspecified: Secondary | ICD-10-CM

## 2021-03-05 DIAGNOSIS — K5900 Constipation, unspecified: Secondary | ICD-10-CM

## 2021-03-05 DIAGNOSIS — M533 Sacrococcygeal disorders, not elsewhere classified: Secondary | ICD-10-CM | POA: Insufficient documentation

## 2021-03-05 DIAGNOSIS — W109XXA Fall (on) (from) unspecified stairs and steps, initial encounter: Secondary | ICD-10-CM

## 2021-03-05 DIAGNOSIS — S300XXA Contusion of lower back and pelvis, initial encounter: Secondary | ICD-10-CM | POA: Diagnosis not present

## 2021-03-05 DIAGNOSIS — R102 Pelvic and perineal pain: Secondary | ICD-10-CM | POA: Diagnosis not present

## 2021-03-05 DIAGNOSIS — K625 Hemorrhage of anus and rectum: Secondary | ICD-10-CM | POA: Diagnosis not present

## 2021-03-05 MED ORDER — LINACLOTIDE 145 MCG PO CAPS: 145.0000 ug | ORAL_CAPSULE | Freq: Every day | ORAL | 0 refills | Status: DC

## 2021-03-05 MED ORDER — HYDROCODONE-ACETAMINOPHEN 5-325 MG PO TABS: 1.0000 | ORAL_TABLET | Freq: Four times a day (QID) | ORAL | 0 refills | Status: DC | PRN

## 2021-03-05 NOTE — Progress Notes (Signed)
Subjective:    Patient ID: Deanna Keller, female    DOB: 01/12/1981, 40 y.o.   MRN: [id number]  HPI Pt is a 40 yo female who fell, 3 days ago on her stairs and on her buttocks. She started having vaginal bleeding immediately. She started having bright red blood in stool this morning and vaginal bleeding has stopped. She did go to her GYN this morning and U/S was done. IUD in place and fibroid noted.   She is in a lot of pain. Very hard to sit. Had a bowel movement yesterday but small and hard. Hx of struggling with some constipation.   .. Active Ambulatory Problems    Diagnosis Date Noted   History of alcoholism (HCC) 07/21/2013   B12 deficiency 07/21/2013   Iron deficiency 07/21/2013   Depression 11/10/2013   Elevated LFTs 11/10/2013   Chronic migraine 11/10/2013   Malabsorption 11/10/2013   S/P gastric bypass 11/11/2013   Vitamin D deficiency 11/11/2013   Recovering alcoholic in remission (HCC) 06/16/2014   Lumbar degenerative disc disease 06/20/2014   Bariatric surgery status in pregnancy 02/10/2013   Phlebectasia 10/21/2011   Hypoglycemia following gastrointestinal surgery 04/12/2013   Generalized seizure (HCC) 08/30/2014   Left lumbar radiculitis 10/11/2014   IUD check up 11/24/2014   Postoperative blind loop syndrome 04/12/2013   Generalized anxiety disorder 09/06/2015   Stress reaction 06/16/2017   Weight gain 10/03/2017   No energy 02/05/2018   Thyromegaly 02/05/2018   Upper back pain 02/05/2018   Hx of thyroid nodule 02/08/2018   Frequent infections 06/16/2018   Arthralgia 06/16/2018   Tachycardia 12/28/2018   Anxiety about health 12/28/2018   Insomnia 12/28/2018   Seizure (HCC) 12/28/2018   Chronic rhinitis 04/03/2020   Moderate episode of recurrent major depressive disorder (HCC) 09/12/2020   Post-seizure headache 09/12/2020   Contusion of coccyx 03/05/2021   Constipation 03/05/2021   Bright red blood per rectum 03/06/2021   Acute buttock pain 03/06/2021    Fall 03/06/2021   Resolved Ambulatory Problems    Diagnosis Date Noted   Viral URI 12/20/2014   Benzodiazepine misuse (HCC) 09/06/2015   Acute bronchitis 12/26/2015   Class 2 obesity due to excess calories without serious comorbidity with body mass index (BMI) of 35.0 to 35.9 in adult 10/03/2017   Past Medical History:  Diagnosis Date   Alcohol abuse    Anxiety    Seizure disorder (HCC) 12/28/2018    Review of Systems See HPI.     Objective:   Physical Exam Vitals reviewed.  Constitutional:      Appearance: Normal appearance. She is obese.  HENT:     Head: Normocephalic.  Cardiovascular:     Rate and Rhythm: Normal rate.     Pulses: Normal pulses.  Pulmonary:     Effort: Pulmonary effort is normal.  Genitourinary:    Vagina: No vaginal discharge.     Rectum: Guaiac result positive.     Comments: No hemorrhoids or external abrasions. She did have a anal bruise at 12:00. Minimal bright red blood seen.  Neurological:     General: No focal deficit present.     Mental Status: She is alert and oriented to person, place, and time.  Psychiatric:        Mood and Affect: Mood normal.          Assessment & Plan:  Marland [name] KitchenDiagnoses and all orders for this visit:  Fall, initial encounter -     DG Pelvis 1-2 Views; Future -  DG Sacrum/Coccyx; Future -     HYDROcodone-acetaminophen (NORCO/VICODIN) 5-325 MG tablet; Take 1 tablet by mouth every 6 (six) hours as needed for up to 5 days for moderate pain.  Acute buttock pain -     DG Pelvis 1-2 Views; Future -     DG Sacrum/Coccyx; Future -     HYDROcodone-acetaminophen (NORCO/VICODIN) 5-325 MG tablet; Take 1 tablet by mouth every 6 (six) hours as needed for up to 5 days for moderate pain.  Contusion of coccyx, initial encounter -     HYDROcodone-acetaminophen (NORCO/VICODIN) 5-325 MG tablet; Take 1 tablet by mouth every 6 (six) hours as needed for up to 5 days for moderate pain.  Constipation, unspecified constipation  type -     linaclotide (LINZESS) 145 MCG CAPS capsule; Take 1 capsule (145 mcg total) by mouth daily before breakfast.  Bright red blood per rectum  Xray on personal read show no fracture but appears to be a lot of stool.  Discussed lots of ice.  Pt had Gastric BYpass avoid NSAIDs.  Hydrocodone and tylenol for pain.  [name].PDMP reviewed during this encounter. Linzess to help with bowel movements daily.  Follow up as needed or if symptoms change or worsen.

## 2021-03-05 NOTE — Progress Notes (Signed)
L 

## 2021-03-06 ENCOUNTER — Encounter: Payer: Self-pay | Admitting: Physician Assistant

## 2021-03-06 DIAGNOSIS — M7918 Myalgia, other site: Secondary | ICD-10-CM | POA: Insufficient documentation

## 2021-03-06 DIAGNOSIS — K625 Hemorrhage of anus and rectum: Secondary | ICD-10-CM | POA: Insufficient documentation

## 2021-03-06 DIAGNOSIS — W19XXXA Unspecified fall, initial encounter: Secondary | ICD-10-CM | POA: Insufficient documentation

## 2021-03-06 NOTE — Patient Instructions (Signed)
Tailbone Injury °The tailbone (coccyx) is the small bone at the lower end of the spine. A tailbone injury may involve stretched ligaments, bruising, or a broken bone (fracture). Tailbone injuries can be painful, and some may take a long time to heal. °What are the causes? °This condition may be caused by: °Falling and landing on the tailbone. °Repeated strain or friction from sitting for long periods of time. This may include actions such as rowing and bicycling. °Childbirth. °In some cases, the cause may not be known. °What are the signs or symptoms? °Symptoms of this condition include: °Pain in the tailbone area or lower back, especially when sitting. °Pain or difficulty when standing up from a sitting position. °Bruising or swelling in the tailbone area. °Painful bowel movements. °In women, pain during intercourse. °How is this diagnosed? °This condition may be diagnosed based on: °Your symptoms. °A physical exam. °If your health care provider suspects a fracture, you may have additional tests, such as: °X-rays. °CT scan. °MRI. °How is this treated? °Most tailbone injuries heal on their own in 4-6 weeks. However, recovery time may be longer if the injury involves a fracture. °Treatment for this condition may include: °NSAIDs or other over-the-counter medicines to help relieve your pain. °Using a large, rubber or inflated ring or cushion to take pressure off the tailbone when sitting. °Physical therapy. °Injecting the tailbone area with local anesthesia and steroid medicine. This is not normally needed unless the pain does not improve over time with over-the-counter pain medicines. °Follow these instructions at home: °Activity °Avoid sitting for long periods of time. °To prevent repeating an injury that is caused by strain or friction: °Wear appropriate padding and sports gear when bicycling and rowing. °Increase your activity as the pain allows. Perform any exercises that are recommended by your health care  provider or physical therapist. °Managing pain, stiffness, and swelling °To help decrease discomfort when sitting: °Sit on your rubber or inflated ring or cushion as told by your health care provider. °Lean forward when you sit. °If directed, apply ice to the injured area: °Put ice in a plastic bag. °Place a towel between your skin and the bag. °Leave the ice on for 20 minutes, 2-3 times per day for the first 1-2 days. °If directed, apply heat to the affected area as often as told by your health care provider. Use the heat source that your health care provider recommends, such as a moist heat pack or a heating pad. °Place a towel between your skin and the heat source. °Leave the heat on for 20-30 minutes. °Remove the heat if your skin turns bright red. This is especially important if you are unable to feel pain, heat, or cold. You may have a greater risk of getting burned. °General instructions °Take over-the-counter and prescription medicines only as told by your health care provider. °To prevent or treat constipation or painful bowel movements, your health care provider may recommend that you: °Drink enough fluid to keep your urine pale yellow. °Eat foods that are high in fiber, such as fresh fruits and vegetables, whole grains, and beans. °Limit foods that are high in fat and processed sugars, such as fried and sweet foods. °Take an over-the-counter or prescription medicine for constipation. °Keep all follow-up visits as directed by your health care provider. This is important. °Contact a health care provider if: °Your pain becomes worse or is not controlled with medicine. °Your bowel movements cause a great deal of discomfort. °You are unable to have a   bowel movement after 4 days. °You have pain during intercourse. °Summary °A tailbone injury may involve stretched ligaments, bruising, or a broken bone (fracture). °Tailbone injuries can be painful. Most heal on their own in 4-6 weeks. °Treatment may include  taking NSAIDs, using a rubber or inflated ring or cushion when sitting, and physical therapy. °Follow any recommendations from your health care provider to prevent or treat constipation. °This information is not intended to replace advice given to you by your health care provider. Make sure you discuss any questions you have with your health care provider. °Document Revised: 04/29/2017 Document Reviewed: 04/29/2017 °Elsevier Patient Education © 2022 [employer] ° °

## 2021-03-06 NOTE — Progress Notes (Signed)
Radiologist read confirms no acute fracture.

## 2021-03-06 NOTE — Progress Notes (Signed)
No acute fractures

## 2021-03-12 ENCOUNTER — Telehealth: Payer: Self-pay

## 2021-03-12 DIAGNOSIS — M7918 Myalgia, other site: Secondary | ICD-10-CM

## 2021-03-12 DIAGNOSIS — S300XXA Contusion of lower back and pelvis, initial encounter: Secondary | ICD-10-CM

## 2021-03-12 DIAGNOSIS — W19XXXA Unspecified fall, initial encounter: Secondary | ICD-10-CM

## 2021-03-12 MED ORDER — HYDROCODONE-ACETAMINOPHEN 5-325 MG PO TABS: 1.0000 | ORAL_TABLET | Freq: Four times a day (QID) | ORAL | 0 refills | Status: DC | PRN

## 2021-03-12 NOTE — Addendum Note (Signed)
Addended by: Jomarie Longs on: 03/12/2021 11:56 AM   Modules accepted: Orders

## 2021-03-12 NOTE — Telephone Encounter (Signed)
The patient sent a MyChart Message about the rectal bleeding.

## 2021-03-12 NOTE — Telephone Encounter (Signed)
..  PDMP reviewed during this encounter. Refills sent on norco.

## 2021-03-12 NOTE — Telephone Encounter (Signed)
Patient called this morning to request a refill on her hydrocodone for her tailbone injury. Please let me know if the refill is sent and I will contact the patient to notify.

## 2021-03-17 ENCOUNTER — Other Ambulatory Visit: Payer: Self-pay | Admitting: Physician Assistant

## 2021-03-17 DIAGNOSIS — W19XXXA Unspecified fall, initial encounter: Secondary | ICD-10-CM

## 2021-03-17 DIAGNOSIS — S300XXA Contusion of lower back and pelvis, initial encounter: Secondary | ICD-10-CM

## 2021-03-17 DIAGNOSIS — M7918 Myalgia, other site: Secondary | ICD-10-CM

## 2021-03-19 MED ORDER — HYDROCODONE-ACETAMINOPHEN 5-325 MG PO TABS: 1.0000 | ORAL_TABLET | Freq: Four times a day (QID) | ORAL | 0 refills | Status: DC | PRN

## 2021-03-19 NOTE — Telephone Encounter (Signed)
..  PDMP reviewed during this encounter. No concerns with abuse.  Discussed last acute supply norco without another office visit.

## 2021-03-23 ENCOUNTER — Other Ambulatory Visit: Payer: Self-pay | Admitting: Physician Assistant

## 2021-03-23 DIAGNOSIS — M7918 Myalgia, other site: Secondary | ICD-10-CM

## 2021-03-23 DIAGNOSIS — W19XXXA Unspecified fall, initial encounter: Secondary | ICD-10-CM

## 2021-03-23 DIAGNOSIS — S300XXA Contusion of lower back and pelvis, initial encounter: Secondary | ICD-10-CM

## 2021-03-23 MED ORDER — HYDROCODONE-ACETAMINOPHEN 5-325 MG PO TABS: 1.0000 | ORAL_TABLET | Freq: Four times a day (QID) | ORAL | 0 refills | Status: DC | PRN

## 2021-03-23 NOTE — Telephone Encounter (Signed)
Patient also left a vm stating same thing.

## 2021-03-27 ENCOUNTER — Ambulatory Visit: Payer: BC Managed Care – PPO | Admitting: Physician Assistant

## 2021-03-28 ENCOUNTER — Ambulatory Visit (INDEPENDENT_AMBULATORY_CARE_PROVIDER_SITE_OTHER): Payer: Self-pay | Admitting: Physician Assistant

## 2021-03-28 ENCOUNTER — Telehealth: Payer: Self-pay | Admitting: Physician Assistant

## 2021-03-28 DIAGNOSIS — S300XXA Contusion of lower back and pelvis, initial encounter: Secondary | ICD-10-CM

## 2021-03-28 DIAGNOSIS — M7918 Myalgia, other site: Secondary | ICD-10-CM

## 2021-03-28 DIAGNOSIS — W19XXXA Unspecified fall, initial encounter: Secondary | ICD-10-CM

## 2021-03-28 DIAGNOSIS — Z91199 Patient's noncompliance with other medical treatment and regimen due to unspecified reason: Secondary | ICD-10-CM

## 2021-03-28 MED ORDER — HYDROCODONE-ACETAMINOPHEN 5-325 MG PO TABS: 1.0000 | ORAL_TABLET | Freq: Four times a day (QID) | ORAL | 0 refills | Status: DC | PRN

## 2021-03-28 NOTE — Telephone Encounter (Signed)
Added to message: I rescheduled patient to Monday, 04/02/21, with Jade.

## 2021-03-28 NOTE — Progress Notes (Signed)
No show

## 2021-03-28 NOTE — Telephone Encounter (Signed)
Please advise, pain med pended.

## 2021-03-28 NOTE — Telephone Encounter (Signed)
Pain medication for patient was sent on 03/23/21. Have patient contact their pharmacy, if they haven't pick up their med yet. Thanks in advance.

## 2021-03-28 NOTE — Telephone Encounter (Signed)
Patient picked up on Medication on 03/23/2021 and it was only for 5 days (counting the 03/23/2021) and today is day 6 and she was requesting a refill on this medication up until her appointment date. [facility] to contact provider for any further refills.

## 2021-03-28 NOTE — Telephone Encounter (Signed)
..  PDMP reviewed during this encounter. Keep appt on 19th you should be able to start pushing out doses further and further a part lasting longer and longer

## 2021-03-28 NOTE — Telephone Encounter (Signed)
She replied and said: I have two pills left, and over my 5 days. Can I get a script for more until appt for my low back? I wonder if the arthritis flared up after fall. Pain is from low back, down.    She is requesting a refill of medication? Please Advise

## 2021-03-28 NOTE — Telephone Encounter (Signed)
I scheduled patient an appointment for today a few days ago Via MyChart. She just sent a MyChart message at 10:19am saying:  Im just now got this message. My car is in the auto shop today and tomorrow. On Friday I have another dr appt at 10. Then pick up my son by 3, so I have a window of time on Friday  between 8:30-9:45 and then 11-2:30?  Otherwise it will have to be Monday- and I am open all day, anytime. My husband is on Xmas break next week so son will be covered, and Im free.  I am going to reschedule her. Is it okay to remove her on schedule to avoid no show fee?

## 2021-03-28 NOTE — Telephone Encounter (Signed)
Yes, you can take her off the schedule. Thanks!

## 2021-03-29 ENCOUNTER — Encounter: Payer: Self-pay | Admitting: Adult Health

## 2021-03-29 NOTE — Telephone Encounter (Signed)
Routing back as I don't have access to close this encounter. AM

## 2021-03-30 ENCOUNTER — Telehealth: Payer: Self-pay | Admitting: Neurology

## 2021-03-30 ENCOUNTER — Other Ambulatory Visit: Payer: Self-pay | Admitting: Neurology

## 2021-03-30 DIAGNOSIS — Z9884 Bariatric surgery status: Secondary | ICD-10-CM | POA: Diagnosis not present

## 2021-03-30 DIAGNOSIS — E669 Obesity, unspecified: Secondary | ICD-10-CM | POA: Diagnosis not present

## 2021-03-30 DIAGNOSIS — Z713 Dietary counseling and surveillance: Secondary | ICD-10-CM | POA: Diagnosis not present

## 2021-03-30 MED ORDER — ALPRAZOLAM 1 MG PO TABS: 1.0000 mg | ORAL_TABLET | Freq: Two times a day (BID) | ORAL | 0 refills | Status: DC

## 2021-03-30 NOTE — Telephone Encounter (Signed)
Please call and inform patient that I will refill the xanax for only one month but she will need to follow up with PCP and psychiatry in the future to get this medication. Dr. Anne Hahn had referred you to: [facility] in [place]. Phone: [phone]. Fax: [phone].

## 2021-03-30 NOTE — Telephone Encounter (Signed)
Pt called and stated she is needing her ALPRAZolam (XANAX) 1 MG tablet filled for this weekend. Please advise.

## 2021-03-30 NOTE — Telephone Encounter (Signed)
I spoke to the patient. She is aware that the refill has been sent to the pharmacy. She verbalized understanding that future refills will have to be managed by PCP or psychiatrist.

## 2021-04-02 ENCOUNTER — Encounter: Payer: Self-pay | Admitting: Family Medicine

## 2021-04-02 ENCOUNTER — Ambulatory Visit (INDEPENDENT_AMBULATORY_CARE_PROVIDER_SITE_OTHER): Payer: BC Managed Care – PPO | Admitting: Physician Assistant

## 2021-04-02 ENCOUNTER — Encounter: Payer: Self-pay | Admitting: Physician Assistant

## 2021-04-02 ENCOUNTER — Other Ambulatory Visit: Payer: Self-pay

## 2021-04-02 ENCOUNTER — Telehealth: Payer: Self-pay | Admitting: Physician Assistant

## 2021-04-02 ENCOUNTER — Telehealth: Payer: Self-pay | Admitting: Family Medicine

## 2021-04-02 VITALS — BP 118/72 | HR 90

## 2021-04-02 DIAGNOSIS — M5416 Radiculopathy, lumbar region: Secondary | ICD-10-CM | POA: Diagnosis not present

## 2021-04-02 DIAGNOSIS — M7918 Myalgia, other site: Secondary | ICD-10-CM

## 2021-04-02 DIAGNOSIS — S300XXA Contusion of lower back and pelvis, initial encounter: Secondary | ICD-10-CM

## 2021-04-02 DIAGNOSIS — W19XXXA Unspecified fall, initial encounter: Secondary | ICD-10-CM

## 2021-04-02 DIAGNOSIS — S300XXS Contusion of lower back and pelvis, sequela: Secondary | ICD-10-CM

## 2021-04-02 DIAGNOSIS — M5136 Other intervertebral disc degeneration, lumbar region: Secondary | ICD-10-CM | POA: Diagnosis not present

## 2021-04-02 DIAGNOSIS — W19XXXS Unspecified fall, sequela: Secondary | ICD-10-CM | POA: Diagnosis not present

## 2021-04-02 DIAGNOSIS — M51369 Other intervertebral disc degeneration, lumbar region without mention of lumbar back pain or lower extremity pain: Secondary | ICD-10-CM

## 2021-04-02 MED ORDER — PREDNISONE 50 MG PO TABS: ORAL_TABLET | ORAL | 0 refills | Status: DC

## 2021-04-02 MED ORDER — HYDROCODONE-ACETAMINOPHEN 5-325 MG PO TABS: 1.0000 | ORAL_TABLET | Freq: Four times a day (QID) | ORAL | 0 refills | Status: DC | PRN

## 2021-04-02 NOTE — Telephone Encounter (Signed)
Call received from on-call nurse stating that patient's medication were never sent in. She had originally asked them to be sent to the wrong pharmacy and then changed her mind, but a new script was not sent in. On-call nurse called both pharmacies to verify there were no active orders. I reviewed PDMP and called [facility] (where she wants the current script to go to) and they confirmed that an order was placed earlier in the day but then cancelled. I will resend both the prednisone and Norco at her request to the [facility] on [address]ryne [address]per. On-call nurse will notify patient. Per my license, only sending in 5 days of Norco. If she needs more, she can reach out to PCP.   Lollie Marrow Reola Calkins, DNP, FNP-C

## 2021-04-02 NOTE — Progress Notes (Signed)
Subjective:    Patient ID: Deanna Keller, female    DOB: [birth year], 40 y.o.   MRN: [id number]  HPI Pt is a 40 yo female with hx of lumbar DDD and radiculitis who presents to the clinic for follow up after 2 falls on buttocks and associated pain. Fell on stairs 11/21 sacrum xray did not show fracture but did have both vaginal and rectal bleeding but have both resolved. BMs are soft and denies any melena or hematochezia.  She has been in pain since and fell again down stairs 12/15. She is having even more left low back pain that radiates down the back of leg into left knee. She cannot sit upright. Taking gabapentin, norco 4 times a day, and OTC ibuprofen. Last MRI was 2016 and showed a paradisc protrusion.   .. Active Ambulatory Problems    Diagnosis Date Noted   History of alcoholism (HCC) 07/21/2013   B12 deficiency 07/21/2013   Iron deficiency 07/21/2013   Depression 11/10/2013   Elevated LFTs 11/10/2013   Chronic migraine 11/10/2013   Malabsorption 11/10/2013   S/P gastric bypass 11/11/2013   Vitamin D deficiency 11/11/2013   Recovering alcoholic in remission (HCC) 06/16/2014   Lumbar degenerative disc disease 06/20/2014   Bariatric surgery status in pregnancy 02/10/2013   Phlebectasia 10/21/2011   Hypoglycemia following gastrointestinal surgery 04/12/2013   Generalized seizure (HCC) 08/30/2014   Lumbar back pain with radiculopathy affecting left lower extremity 10/11/2014   IUD check up 11/24/2014   Postoperative blind loop syndrome 04/12/2013   Generalized anxiety disorder 09/06/2015   Stress reaction 06/16/2017   Weight gain 10/03/2017   No energy 02/05/2018   Thyromegaly 02/05/2018   Upper back pain 02/05/2018   Hx of thyroid nodule 02/08/2018   Frequent infections 06/16/2018   Arthralgia 06/16/2018   Tachycardia 12/28/2018   Anxiety about health 12/28/2018   Insomnia 12/28/2018   Seizure (HCC) 12/28/2018   Chronic rhinitis 04/03/2020   Moderate episode of recurrent  major depressive disorder (HCC) 09/12/2020   Post-seizure headache 09/12/2020   Contusion of coccyx 03/05/2021   Constipation 03/05/2021   Bright red blood per rectum 03/06/2021   Acute buttock pain 03/06/2021   Fall 03/06/2021   Resolved Ambulatory Problems    Diagnosis Date Noted   Viral URI 12/20/2014   Benzodiazepine misuse (HCC) 09/06/2015   Acute bronchitis 12/26/2015   Class 2 obesity due to excess calories without serious comorbidity with body mass index (BMI) of 35.0 to 35.9 in adult 10/03/2017   Past Medical History:  Diagnosis Date   Alcohol abuse    Anxiety    Seizure disorder (HCC) 12/28/2018     Review of Systems See HPI.     Objective:   Physical Exam Vitals reviewed.  Constitutional:      Appearance: Normal appearance. She is obese.  HENT:     Head: Normocephalic.  Cardiovascular:     Rate and Rhythm: Normal rate and regular rhythm.  Pulmonary:     Effort: Pulmonary effort is normal.  Musculoskeletal:     Comments: Tenderness over lumbar spine to palpation.  Positive left straight leg raise.  4/5 strength left leg to resistance.  Pain with any ROM at waist Not able to sit upright due to pain  Neurological:     General: No focal deficit present.     Mental Status: She is alert and oriented to person, place, and time.  Psychiatric:        Mood and Affect: Mood normal.  Assessment & Plan:  Marland [name] KitchenJasman was seen today for pain.  Diagnoses and all orders for this visit:  Left lumbar radiculitis -     MR LUMBAR SPINE WO CONTRAST; Future -     HYDROcodone-acetaminophen (NORCO/VICODIN) 5-325 MG tablet; Take 1 tablet by mouth every 6 (six) hours as needed for up to 5 days for moderate pain. -     predniSONE (DELTASONE) 50 MG tablet; One tab PO daily for 5 days.  Fall, sequela -     Discontinue: HYDROcodone-acetaminophen (NORCO/VICODIN) 5-325 MG tablet; Take 1 tablet by mouth every 6 (six) hours as needed for up to 10 days for moderate pain. -      MR LUMBAR SPINE WO CONTRAST; Future -     HYDROcodone-acetaminophen (NORCO/VICODIN) 5-325 MG tablet; Take 1 tablet by mouth every 6 (six) hours as needed for up to 5 days for moderate pain. -     predniSONE (DELTASONE) 50 MG tablet; One tab PO daily for 5 days.  Acute buttock pain -     Discontinue: HYDROcodone-acetaminophen (NORCO/VICODIN) 5-325 MG tablet; Take 1 tablet by mouth every 6 (six) hours as needed for up to 10 days for moderate pain. -     MR LUMBAR SPINE WO CONTRAST; Future -     HYDROcodone-acetaminophen (NORCO/VICODIN) 5-325 MG tablet; Take 1 tablet by mouth every 6 (six) hours as needed for up to 5 days for moderate pain. -     predniSONE (DELTASONE) 50 MG tablet; One tab PO daily for 5 days.  Contusion of coccyx, sequela -     Discontinue: HYDROcodone-acetaminophen (NORCO/VICODIN) 5-325 MG tablet; Take 1 tablet by mouth every 6 (six) hours as needed for up to 10 days for moderate pain. -     MR LUMBAR SPINE WO CONTRAST; Future -     HYDROcodone-acetaminophen (NORCO/VICODIN) 5-325 MG tablet; Take 1 tablet by mouth every 6 (six) hours as needed for up to 5 days for moderate pain. -     predniSONE (DELTASONE) 50 MG tablet; One tab PO daily for 5 days.  Lumbar degenerative disc disease -     MR LUMBAR SPINE WO CONTRAST; Future -     HYDROcodone-acetaminophen (NORCO/VICODIN) 5-325 MG tablet; Take 1 tablet by mouth every 6 (six) hours as needed for up to 5 days for moderate pain. -     predniSONE (DELTASONE) 50 MG tablet; One tab PO daily for 5 days.  Other orders -     Discontinue: predniSONE (DELTASONE) 50 MG tablet; One tab PO daily for 5 days.   Initial fall occurred 11/21 since another fall has occurred. Has known DDD, lumbar.  Need MRI.  Prednisone burst given with refill of norco.  Continue symptomatic care.  Marland [name] KitchenPDMP reviewed during this encounter. Taking 4 times a day.

## 2021-04-02 NOTE — Patient Instructions (Addendum)
Will get MRI

## 2021-04-03 ENCOUNTER — Encounter: Payer: Self-pay | Admitting: Physician Assistant

## 2021-04-03 MED ORDER — HYDROCODONE-ACETAMINOPHEN 5-325 MG PO TABS: 1.0000 | ORAL_TABLET | Freq: Four times a day (QID) | ORAL | 0 refills | Status: DC | PRN

## 2021-04-03 MED ORDER — PREDNISONE 50 MG PO TABS
ORAL_TABLET | ORAL | 0 refills | Status: DC
Start: 2021-04-03 — End: 2021-04-19

## 2021-04-03 NOTE — Telephone Encounter (Signed)
Ok [facility] am JJ.

## 2021-04-05 ENCOUNTER — Encounter: Payer: Self-pay | Admitting: Physician Assistant

## 2021-04-05 DIAGNOSIS — M5136 Other intervertebral disc degeneration, lumbar region: Secondary | ICD-10-CM

## 2021-04-05 DIAGNOSIS — S300XXS Contusion of lower back and pelvis, sequela: Secondary | ICD-10-CM

## 2021-04-05 DIAGNOSIS — W19XXXS Unspecified fall, sequela: Secondary | ICD-10-CM

## 2021-04-05 DIAGNOSIS — M7918 Myalgia, other site: Secondary | ICD-10-CM

## 2021-04-05 DIAGNOSIS — M5416 Radiculopathy, lumbar region: Secondary | ICD-10-CM

## 2021-04-06 ENCOUNTER — Other Ambulatory Visit: Payer: Self-pay | Admitting: Physician Assistant

## 2021-04-06 DIAGNOSIS — M5416 Radiculopathy, lumbar region: Secondary | ICD-10-CM

## 2021-04-06 DIAGNOSIS — S300XXS Contusion of lower back and pelvis, sequela: Secondary | ICD-10-CM

## 2021-04-06 DIAGNOSIS — W19XXXS Unspecified fall, sequela: Secondary | ICD-10-CM

## 2021-04-06 DIAGNOSIS — M7918 Myalgia, other site: Secondary | ICD-10-CM

## 2021-04-06 DIAGNOSIS — M5136 Other intervertebral disc degeneration, lumbar region: Secondary | ICD-10-CM

## 2021-04-06 MED ORDER — HYDROCODONE-ACETAMINOPHEN 5-325 MG PO TABS: 1.0000 | ORAL_TABLET | Freq: Four times a day (QID) | ORAL | 0 refills | Status: DC | PRN

## 2021-04-06 NOTE — Telephone Encounter (Signed)
..  PDMP reviewed during this encounter. Last fill 12/19.  Refilled for 10 days.  Follow up on MRI after christmas.

## 2021-04-07 ENCOUNTER — Ambulatory Visit (INDEPENDENT_AMBULATORY_CARE_PROVIDER_SITE_OTHER): Payer: BC Managed Care – PPO

## 2021-04-07 ENCOUNTER — Other Ambulatory Visit: Payer: Self-pay

## 2021-04-07 DIAGNOSIS — M5416 Radiculopathy, lumbar region: Secondary | ICD-10-CM

## 2021-04-07 DIAGNOSIS — M545 Low back pain, unspecified: Secondary | ICD-10-CM | POA: Diagnosis not present

## 2021-04-07 DIAGNOSIS — M5136 Other intervertebral disc degeneration, lumbar region: Secondary | ICD-10-CM

## 2021-04-07 DIAGNOSIS — W109XXS Fall (on) (from) unspecified stairs and steps, sequela: Secondary | ICD-10-CM

## 2021-04-07 DIAGNOSIS — W19XXXS Unspecified fall, sequela: Secondary | ICD-10-CM

## 2021-04-07 DIAGNOSIS — M7918 Myalgia, other site: Secondary | ICD-10-CM

## 2021-04-07 DIAGNOSIS — S300XXS Contusion of lower back and pelvis, sequela: Secondary | ICD-10-CM

## 2021-04-07 DIAGNOSIS — M2578 Osteophyte, vertebrae: Secondary | ICD-10-CM | POA: Diagnosis not present

## 2021-04-09 ENCOUNTER — Other Ambulatory Visit: Payer: Self-pay | Admitting: Physician Assistant

## 2021-04-09 DIAGNOSIS — W19XXXS Unspecified fall, sequela: Secondary | ICD-10-CM

## 2021-04-09 DIAGNOSIS — M7918 Myalgia, other site: Secondary | ICD-10-CM

## 2021-04-09 DIAGNOSIS — M5136 Other intervertebral disc degeneration, lumbar region: Secondary | ICD-10-CM

## 2021-04-09 DIAGNOSIS — S300XXS Contusion of lower back and pelvis, sequela: Secondary | ICD-10-CM

## 2021-04-09 DIAGNOSIS — M51369 Other intervertebral disc degeneration, lumbar region without mention of lumbar back pain or lower extremity pain: Secondary | ICD-10-CM

## 2021-04-09 DIAGNOSIS — M5416 Radiculopathy, lumbar region: Secondary | ICD-10-CM

## 2021-04-10 ENCOUNTER — Telehealth: Payer: Self-pay | Admitting: Neurology

## 2021-04-10 MED ORDER — HYDROCODONE-ACETAMINOPHEN 5-325 MG PO TABS: 1.0000 | ORAL_TABLET | Freq: Four times a day (QID) | ORAL | 0 refills | Status: DC | PRN

## 2021-04-10 NOTE — Telephone Encounter (Signed)
Phone note in Epic. 

## 2021-04-10 NOTE — Telephone Encounter (Signed)
We have done our part. Now Dr. Teresa Coombs has to do theres. We can follow up with patient or Dr. Teresa Coombs office in the morning.

## 2021-04-10 NOTE — Telephone Encounter (Addendum)
Per [place] narcotic registry, she received the following:  1) alprazolam 1mg , #60 on 03/30/21  2) hydrocodone -apap 5-325mg , #20 on 04/02/21 (received #120 since 03/05/21).  ____________________________________  Dr. 03/07/21 gave her a one time courtesy refill of alprazolam and will not be providing this medication again.  I spoke to the patient. States she had an accident (fall) that is requiring more hydrocodone for pain management. The pharmacy needs authorization to fill it. The approval will need to come from her PCP. The patient says she is no longer taking the alprazolam. Her PCP may also need to notify the pharmacy to remove that medication, if that is the plan. Our office does not manage either prescription.

## 2021-04-10 NOTE — Progress Notes (Signed)
MRI does show some changes with more disc bulging to the left more than right at L4/L5. Start with appt with Dr. Karie Schwalbe to go over the results and to discuss plan. Get her in this week.

## 2021-04-10 NOTE — Telephone Encounter (Signed)
Pt called states the pharmacy needs for doctor to sign off on the ALPRAZolam Prudy Feeler) 1 MG tablet to make sure she is not taking the HYDROcodone-acetaminophen (NORCO/VICODIN) 5-325 MG tablet with it. Pharmacy [id number] - [place], [place] - [address]

## 2021-04-10 NOTE — Telephone Encounter (Signed)
I spoke to Deanna Keller at [place]. She was notified that this decision will be deferred to her PCP office for the final authorization on the hydrocodone. The patient says she is no longer using the alprazolam but has the recent fill at home.

## 2021-04-10 NOTE — Telephone Encounter (Signed)
I'm not sure what else to do with this. I spoke with the pharmacist and they stated they would not fill this until they had an okay from [place] and the doctor writing Xanax. They have reached out to that doctor as well.

## 2021-04-10 NOTE — Telephone Encounter (Signed)
The patient sent another mychart message asking for [place] to call the pharmacy. I spoke to [place] at [place]. She was notified that any decision on filling additional hydrocodone will be deferred to her PCP office for the final authorization. The patient says she is no longer using the alprazolam but has the recent medication available at home (#60 picked up on 03/30/21).  Per [place] narcotic registry, she has received #120 of the hydrocodone since 03/05/21. The pharmacy has another pending prescription for an additional #40.   I called the patient again and informed her she will need to handle this request with her PCP office. We are not able to provide approval for the pharmacy to fill the hydrocodone prescription written by another office. We have done all we can. She verbalized understanding.

## 2021-04-10 NOTE — Telephone Encounter (Signed)
I sent it Friday? Did it not go through?

## 2021-04-11 NOTE — Telephone Encounter (Signed)
Patient left a voicemail about this as well.   We have already given [facility] to [facility] to fill RX from our standpoint. Yesterday they stated they needed an okay from Dr. Teresa Coombs. This note is in chart from Dr. Karie Georges office:  Lilla Shook, RN      8:58 AM Note I called the patient back. I clarified that I have not spoken with her PCP. I did speak to the pharmacist at [facility] to make her aware that Dr. Teresa Coombs is going to defer the decision on the hydrocodone refill to the prescribing provider. She is reporting not taking the xanax. However, there is documentation from her PCP that she took a dose on Christmas day. I ask her about it and she informed me it was only one tablet. Says she has no plans to use it further. She filled the xanax rx on 03/30/21 for #60. Dr. Teresa Coombs is not going to provide a written statement saying she is not using the medication. She has it available and has recently taken it. Our office is unable to help with this issue. She also contacted the work-in physician after hours to ask for this authorization (Dr. Lucia Gaskins). She was also in agreement that is an inappropriate request. This matter will need to be settled between her PCP and the pharmacy. Further calls about this issue may potentially lead to dismissal.   Additionally, I discussed the addictive properties of these types of medications and urged her to use caution for safety reasons and to prevent addiction. She verbalized understanding.     Unsure what else to do with this?

## 2021-04-11 NOTE — Telephone Encounter (Signed)
Deanna Keller, Deanna Kaczynski L, PA-C  You 2 minutes ago (3:51 PM)   Can we just call the pharmacy and make sure they are aware that [facility] is letting me make the call and patient is reporting no more use of xanax and is going to return remaining pills that were dispensed on 16 to our office and ok to fill.     I called and spoke with Adria, pharmacist at [facility]. They state because the patient told them she was not taking Xanax since her injury and her initial fall was the end of November, then she filled Xanax on 03/30/2021 that they would not fill the prescription for Norco at their pharmacy. Jesslynn Kruck - FYI.

## 2021-04-11 NOTE — Telephone Encounter (Signed)
I called the patient back. I clarified that I have not spoken with her PCP. I did speak to the pharmacist at [facility] to make her aware that Dr. Teresa Coombs is going to defer the decision on the hydrocodone refill to the prescribing provider. She is reporting not taking the xanax. However, there is documentation from her PCP that she took a dose on Christmas day. I ask her about it and she informed me it was only one tablet. Says she has no plans to use it further. She filled the xanax rx on 03/30/21 for #60. Dr. Teresa Coombs is not going to provide a written statement saying she is not using the medication. She has it available and has recently taken it. Our office is unable to help any further. This matter will need to be settled between her PCP and the pharmacy.

## 2021-04-11 NOTE — Telephone Encounter (Addendum)
I called the patient back. I clarified that I have not spoken with her PCP. I did speak to the pharmacist at [facility] to make her aware that Dr. Teresa Coombs is going to defer the decision on the hydrocodone refill to the prescribing provider. She is reporting not taking the xanax. However, there is documentation from her PCP that she took a dose on Christmas day. I ask her about it and she informed me it was only one tablet. Says she has no plans to use it further. She filled the xanax rx on 03/30/21 for #60. Dr. Teresa Coombs is not going to provide a written statement saying she is not using the medication. She has it available and has recently taken it. Our office is unable to help with this issue. She also contacted the work-in physician after hours to ask for this authorization (Dr. Lucia Gaskins). She was also in agreement that is an inappropriate request. This matter will need to be settled between her PCP and the pharmacy. Further calls about this issue may potentially lead to dismissal.  Additionally, I discussed the addictive properties of these types of medications and urged her to use caution for safety reasons and to prevent addiction. She verbalized understanding.

## 2021-04-11 NOTE — Telephone Encounter (Signed)
Additionally, I discussed the addictive properties of these types of medications and urged her to use caution to prevent addiction. She verbalized understanding.

## 2021-04-11 NOTE — Telephone Encounter (Signed)
Pt came in and left a bottle of Xanax with me (39 tabs) We gave the bottle to Dr.Metheney and she disposed of the meds and I notified the PA [facility] so she could call in new med.

## 2021-04-12 NOTE — Telephone Encounter (Signed)
Confirmed with Deanna Keller that patient dropped off Xanax, Toniann Fail disposed of them in the medical disposal bin with [place] as witness. Unable to give back to patient?

## 2021-04-13 ENCOUNTER — Ambulatory Visit: Payer: BC Managed Care – PPO | Admitting: Sports Medicine

## 2021-04-13 ENCOUNTER — Other Ambulatory Visit: Payer: Self-pay | Admitting: Physician Assistant

## 2021-04-13 DIAGNOSIS — F331 Major depressive disorder, recurrent, moderate: Secondary | ICD-10-CM

## 2021-04-13 DIAGNOSIS — F411 Generalized anxiety disorder: Secondary | ICD-10-CM

## 2021-04-13 DIAGNOSIS — G4700 Insomnia, unspecified: Secondary | ICD-10-CM

## 2021-04-13 MED ORDER — ALPRAZOLAM 1 MG PO TABS: 1.0000 mg | ORAL_TABLET | Freq: Two times a day (BID) | ORAL | 0 refills | Status: DC

## 2021-04-13 MED ORDER — HYDROCODONE-ACETAMINOPHEN 5-325 MG PO TABS: 1.0000 | ORAL_TABLET | Freq: Four times a day (QID) | ORAL | 0 refills | Status: DC | PRN

## 2021-04-13 NOTE — Telephone Encounter (Signed)
Pt turned in xanax.  Will send norco to another pharmacy.

## 2021-04-13 NOTE — Addendum Note (Signed)
Addended by: Jomarie Longs on: 04/13/2021 08:46 AM   Modules accepted: Orders

## 2021-04-13 NOTE — Telephone Encounter (Signed)
Called and [facility] at [facility]. Explained situation. They will document and fill Xanax. Patient aware.

## 2021-04-13 NOTE — Progress Notes (Signed)
Pt came into office and brought 19 tablets of norco to dispense of so that she can get back on xanax.  She is not doing well off xanax and would rather be off norco than without xanax. She had previously dropped off her xanax to get norco. So she will need rx of xanax refilled.   Pt request psychiatrist referral. Done today.

## 2021-04-17 ENCOUNTER — Other Ambulatory Visit: Payer: Self-pay | Admitting: Physician Assistant

## 2021-04-17 DIAGNOSIS — G43911 Migraine, unspecified, intractable, with status migrainosus: Secondary | ICD-10-CM

## 2021-04-18 NOTE — Telephone Encounter (Signed)
Last written 01/08/2021 #90 with one refill  Please advise

## 2021-04-19 ENCOUNTER — Encounter: Payer: Self-pay | Admitting: Adult Health

## 2021-04-19 ENCOUNTER — Ambulatory Visit (INDEPENDENT_AMBULATORY_CARE_PROVIDER_SITE_OTHER): Payer: BC Managed Care – PPO | Admitting: Sports Medicine

## 2021-04-19 ENCOUNTER — Encounter: Payer: Self-pay | Admitting: Physician Assistant

## 2021-04-19 ENCOUNTER — Other Ambulatory Visit: Payer: Self-pay

## 2021-04-19 DIAGNOSIS — M533 Sacrococcygeal disorders, not elsewhere classified: Secondary | ICD-10-CM | POA: Diagnosis not present

## 2021-04-19 MED ORDER — HYDROCODONE-ACETAMINOPHEN 5-325 MG PO TABS: 1.0000 | ORAL_TABLET | Freq: Three times a day (TID) | ORAL | 0 refills | Status: DC | PRN

## 2021-04-19 MED ORDER — PREDNISONE 10 MG (48) PO TBPK: ORAL_TABLET | Freq: Every day | ORAL | 0 refills | Status: DC

## 2021-04-19 NOTE — Assessment & Plan Note (Signed)
This is a pleasant 41 year old female, around Thanksgiving she had a slip and fall directly onto her bottom, pain at the coccyx, lower sacrum. X-rays done that did not show any evidence of a fracture through the sacrum or coccyx, she ultimately had an MRI of her lumbar spine that showed her baseline lumbar degenerative processes. On exam she has tenderness over the left sacroiliac joint, the dominant pain generator seems to be her sacrococcygeal junction/coccyx. We will do another course of prednisone, 12-day taper with a short course of hydrocodone. I like to see her back in 1.5 to 2 weeks, and we can do a coccygeal injection/intercoccygeal disc injection if not significantly better. If this does not give her relief then we will proceed with sacrococcygeal MRI.

## 2021-04-19 NOTE — Progress Notes (Signed)
° ° °  Procedures performed today:    None.  Independent interpretation of notes and tests performed by another provider:   None.  Brief History, Exam, Impression, and Recommendations:    Traumatic coccydynia This is a pleasant 41 year old female, around Thanksgiving she had a slip and fall directly onto her bottom, pain at the coccyx, lower sacrum. X-rays done that did not show any evidence of a fracture through the sacrum or coccyx, she ultimately had an MRI of her lumbar spine that showed her baseline lumbar degenerative processes. On exam she has tenderness over the left sacroiliac joint, the dominant pain generator seems to be her sacrococcygeal junction/coccyx. We will do another course of prednisone, 12-day taper with a short course of hydrocodone. I like to see her back in 1.5 to 2 weeks, and we can do a coccygeal injection/intercoccygeal disc injection if not significantly better. If this does not give her relief then we will proceed with sacrococcygeal MRI.    ___________________________________________ Gwen Her. Dianah Field, M.D., ABFM., CAQSM. Primary Care and [place] Instructor of [employer]

## 2021-04-20 ENCOUNTER — Encounter: Payer: Self-pay | Admitting: Sports Medicine

## 2021-04-20 DIAGNOSIS — M533 Sacrococcygeal disorders, not elsewhere classified: Secondary | ICD-10-CM

## 2021-04-23 MED ORDER — GABAPENTIN 300 MG PO CAPS: ORAL_CAPSULE | ORAL | 3 refills | Status: DC

## 2021-04-23 MED ORDER — LAMOTRIGINE ER 200 MG PO TB24: 400.0000 mg | ORAL_TABLET | Freq: Every day | ORAL | 1 refills | Status: DC

## 2021-04-23 MED ORDER — VENLAFAXINE HCL ER 150 MG PO CP24: 150.0000 mg | ORAL_CAPSULE | Freq: Every day | ORAL | 1 refills | Status: DC

## 2021-04-24 ENCOUNTER — Other Ambulatory Visit: Payer: Self-pay | Admitting: Sports Medicine

## 2021-04-24 DIAGNOSIS — M533 Sacrococcygeal disorders, not elsewhere classified: Secondary | ICD-10-CM

## 2021-04-24 NOTE — Telephone Encounter (Signed)
All meds up to date, [facility] sent yesterday.   Behavioral Health referral sent downstairs per [facility].

## 2021-04-24 NOTE — Telephone Encounter (Signed)
Please just make sure she has everything and referral for [facility].

## 2021-04-27 MED ORDER — HYDROCODONE-ACETAMINOPHEN 5-325 MG PO TABS: 1.0000 | ORAL_TABLET | Freq: Three times a day (TID) | ORAL | 0 refills | Status: DC | PRN

## 2021-05-03 ENCOUNTER — Ambulatory Visit: Payer: BC Managed Care – PPO | Admitting: Sports Medicine

## 2021-05-03 ENCOUNTER — Encounter: Payer: Self-pay | Admitting: Sports Medicine

## 2021-05-04 ENCOUNTER — Ambulatory Visit: Payer: BC Managed Care – PPO | Admitting: Sports Medicine

## 2021-05-04 ENCOUNTER — Other Ambulatory Visit: Payer: Self-pay | Admitting: Physician Assistant

## 2021-05-07 ENCOUNTER — Encounter: Payer: Self-pay | Admitting: Physician Assistant

## 2021-05-07 ENCOUNTER — Other Ambulatory Visit: Payer: Self-pay | Admitting: Physician Assistant

## 2021-05-08 ENCOUNTER — Other Ambulatory Visit: Payer: Self-pay

## 2021-05-08 MED ORDER — ALPRAZOLAM 1 MG PO TABS: 1.0000 mg | ORAL_TABLET | Freq: Two times a day (BID) | ORAL | 1 refills | Status: DC

## 2021-05-09 NOTE — Telephone Encounter (Signed)
Find out if post date to 1/27 for xanax is ok. It was last filled via [facility] on 12/30 so she should have enough.

## 2021-05-12 ENCOUNTER — Encounter: Payer: Self-pay | Admitting: Adult Health

## 2021-05-15 ENCOUNTER — Ambulatory Visit: Payer: BC Managed Care – PPO | Admitting: Adult Health

## 2021-05-21 ENCOUNTER — Ambulatory Visit: Payer: BC Managed Care – PPO | Admitting: Sports Medicine

## 2021-05-21 ENCOUNTER — Encounter: Payer: Self-pay | Admitting: Physician Assistant

## 2021-05-22 ENCOUNTER — Encounter (INDEPENDENT_AMBULATORY_CARE_PROVIDER_SITE_OTHER): Payer: BC Managed Care – PPO | Admitting: Sports Medicine

## 2021-05-22 ENCOUNTER — Ambulatory Visit (INDEPENDENT_AMBULATORY_CARE_PROVIDER_SITE_OTHER): Payer: BC Managed Care – PPO

## 2021-05-22 ENCOUNTER — Other Ambulatory Visit: Payer: Self-pay

## 2021-05-22 ENCOUNTER — Ambulatory Visit (INDEPENDENT_AMBULATORY_CARE_PROVIDER_SITE_OTHER): Payer: BC Managed Care – PPO | Admitting: Sports Medicine

## 2021-05-22 DIAGNOSIS — M533 Sacrococcygeal disorders, not elsewhere classified: Secondary | ICD-10-CM

## 2021-05-22 DIAGNOSIS — M5136 Other intervertebral disc degeneration, lumbar region: Secondary | ICD-10-CM

## 2021-05-22 DIAGNOSIS — M51369 Other intervertebral disc degeneration, lumbar region without mention of lumbar back pain or lower extremity pain: Secondary | ICD-10-CM

## 2021-05-22 MED ORDER — GABAPENTIN 600 MG PO TABS: 600.0000 mg | ORAL_TABLET | Freq: Three times a day (TID) | ORAL | 3 refills | Status: DC

## 2021-05-22 NOTE — Assessment & Plan Note (Signed)
Deanna Keller returns, she is a pleasant 40 year old female, she continues to have some left-sided low back pain. Initially suspected to be a traumatic coccydynia, this seems to have resolved, and its localized more to the left sacroiliac joint. We also did review her lumbar spine MRI that shows L5-S1 DDD without obvious neuroforaminal stenosis. Today we injected her left sacroiliac joint, she had good concordant pain during the injection and relief of pain afterwards. We will also increase her gabapentin to 600 mg 3 times daily. Return to see me in about 4 weeks.

## 2021-05-22 NOTE — Progress Notes (Signed)
° ° °  Procedures performed today:    Procedure: Real-time Ultrasound Guided injection of the left sacroiliac joint Device: Samsung HS60  Verbal informed consent obtained.  Time-out conducted.  Noted no overlying erythema, induration, or other signs of local infection.  Skin prepped in a sterile fashion.  Local anesthesia: Topical Ethyl chloride.  With sterile technique and under real time ultrasound guidance: Noted normal-appearing SI joint, 1 cc Kenalog 40, 2 cc lidocaine, 2 cc bupivacaine injected easily Completed without difficulty  Advised to call if fevers/chills, erythema, induration, drainage, or persistent bleeding.  Images permanently stored and available for review in PACS.  Impression: Technically successful ultrasound guided injection.  Independent interpretation of notes and tests performed by another provider:   None.  Brief History, Exam, Impression, and Recommendations:    Lumbar degenerative disc disease Deanna Keller returns, she is a pleasant 41 year old female, she continues to have some left-sided low back pain. Initially suspected to be a traumatic coccydynia, this seems to have resolved, and its localized more to the left sacroiliac joint. We also did review her lumbar spine MRI that shows L5-S1 DDD without obvious neuroforaminal stenosis. Today we injected her left sacroiliac joint, she had good concordant pain during the injection and relief of pain afterwards. We will also increase her gabapentin to 600 mg 3 times daily. Return to see me in about 4 weeks.    ___________________________________________ Ihor Austin. Benjamin Stain, M.D., ABFM., CAQSM. Primary Care and Sports Medicine [facility]  Adjunct Instructor of Family Medicine  [employer]

## 2021-05-23 ENCOUNTER — Other Ambulatory Visit: Payer: Self-pay | Admitting: Sports Medicine

## 2021-05-23 DIAGNOSIS — M533 Sacrococcygeal disorders, not elsewhere classified: Secondary | ICD-10-CM

## 2021-05-23 NOTE — Telephone Encounter (Signed)
Patient has called and messaged several times. She had a left SI joint injection in office yesterday and is complaining of pain and numbness. She has gabapentin and tylenol for pain but this is not helping. She is requesting a refill on Hydrocodone.   Last refill was 04/27/2021 for 15 tabs

## 2021-05-24 DIAGNOSIS — M4726 Other spondylosis with radiculopathy, lumbar region: Secondary | ICD-10-CM | POA: Diagnosis not present

## 2021-05-24 DIAGNOSIS — M9902 Segmental and somatic dysfunction of thoracic region: Secondary | ICD-10-CM | POA: Diagnosis not present

## 2021-05-24 DIAGNOSIS — M25552 Pain in left hip: Secondary | ICD-10-CM | POA: Diagnosis not present

## 2021-05-24 DIAGNOSIS — M4722 Other spondylosis with radiculopathy, cervical region: Secondary | ICD-10-CM | POA: Diagnosis not present

## 2021-05-24 MED ORDER — HYDROCODONE-ACETAMINOPHEN 5-325 MG PO TABS: 1.0000 | ORAL_TABLET | Freq: Three times a day (TID) | ORAL | 0 refills | Status: DC | PRN

## 2021-05-24 NOTE — Addendum Note (Signed)
Addended by: Monica Becton on: 05/24/2021 09:20 PM   Modules accepted: Orders

## 2021-05-24 NOTE — Telephone Encounter (Signed)
I spent 5 total minutes of online digital evaluation and management services in this patient-initiated request for online care. 

## 2021-05-24 NOTE — Assessment & Plan Note (Signed)
Patient messaged multiple times, maybe 5 or 6 in the 1 day, maybe more, indicating that SI joint injection was not helping approximately 2 days after the injection, mild lumbar degenerative changes noted on MRI, proceeding with left L4-L5 and left L5-S1 transforaminal epidurals, 4-week follow-up to evaluate efficacy, if no efficacy she will need to go to medical pain management as we would not continue chronic narcotic treatment.

## 2021-05-25 ENCOUNTER — Ambulatory Visit: Payer: BC Managed Care – PPO | Admitting: Sports Medicine

## 2021-05-25 DIAGNOSIS — M25552 Pain in left hip: Secondary | ICD-10-CM | POA: Diagnosis not present

## 2021-05-25 DIAGNOSIS — M9902 Segmental and somatic dysfunction of thoracic region: Secondary | ICD-10-CM | POA: Diagnosis not present

## 2021-05-25 DIAGNOSIS — M4722 Other spondylosis with radiculopathy, cervical region: Secondary | ICD-10-CM | POA: Diagnosis not present

## 2021-05-25 DIAGNOSIS — M4726 Other spondylosis with radiculopathy, lumbar region: Secondary | ICD-10-CM | POA: Diagnosis not present

## 2021-05-25 MED ORDER — HYDROCODONE-ACETAMINOPHEN 5-325 MG PO TABS: 1.0000 | ORAL_TABLET | Freq: Three times a day (TID) | ORAL | 0 refills | Status: DC | PRN

## 2021-05-25 NOTE — Addendum Note (Signed)
Addended by: Silverio Decamp on: 05/25/2021 11:15 AM   Modules accepted: Orders

## 2021-05-26 ENCOUNTER — Encounter: Payer: Self-pay | Admitting: Sports Medicine

## 2021-05-29 DIAGNOSIS — Z9884 Bariatric surgery status: Secondary | ICD-10-CM | POA: Diagnosis not present

## 2021-05-29 DIAGNOSIS — K912 Postsurgical malabsorption, not elsewhere classified: Secondary | ICD-10-CM | POA: Diagnosis not present

## 2021-05-30 DIAGNOSIS — M4726 Other spondylosis with radiculopathy, lumbar region: Secondary | ICD-10-CM | POA: Diagnosis not present

## 2021-05-30 DIAGNOSIS — M25552 Pain in left hip: Secondary | ICD-10-CM | POA: Diagnosis not present

## 2021-05-30 DIAGNOSIS — M9902 Segmental and somatic dysfunction of thoracic region: Secondary | ICD-10-CM | POA: Diagnosis not present

## 2021-05-30 DIAGNOSIS — M4722 Other spondylosis with radiculopathy, cervical region: Secondary | ICD-10-CM | POA: Diagnosis not present

## 2021-06-01 ENCOUNTER — Other Ambulatory Visit: Payer: BC Managed Care – PPO

## 2021-06-05 ENCOUNTER — Encounter: Payer: Self-pay | Admitting: Neurology

## 2021-06-05 DIAGNOSIS — R569 Unspecified convulsions: Secondary | ICD-10-CM | POA: Diagnosis not present

## 2021-06-05 DIAGNOSIS — Z9884 Bariatric surgery status: Secondary | ICD-10-CM | POA: Diagnosis not present

## 2021-06-05 DIAGNOSIS — Z6834 Body mass index (BMI) 34.0-34.9, adult: Secondary | ICD-10-CM | POA: Diagnosis not present

## 2021-06-05 DIAGNOSIS — E669 Obesity, unspecified: Secondary | ICD-10-CM | POA: Diagnosis not present

## 2021-06-11 DIAGNOSIS — M4726 Other spondylosis with radiculopathy, lumbar region: Secondary | ICD-10-CM | POA: Diagnosis not present

## 2021-06-11 DIAGNOSIS — M9902 Segmental and somatic dysfunction of thoracic region: Secondary | ICD-10-CM | POA: Diagnosis not present

## 2021-06-11 DIAGNOSIS — M25552 Pain in left hip: Secondary | ICD-10-CM | POA: Diagnosis not present

## 2021-06-11 DIAGNOSIS — M4722 Other spondylosis with radiculopathy, cervical region: Secondary | ICD-10-CM | POA: Diagnosis not present

## 2021-06-13 ENCOUNTER — Ambulatory Visit (HOSPITAL_COMMUNITY): Payer: BC Managed Care – PPO | Admitting: Psychiatry

## 2021-06-13 DIAGNOSIS — M4722 Other spondylosis with radiculopathy, cervical region: Secondary | ICD-10-CM | POA: Diagnosis not present

## 2021-06-13 DIAGNOSIS — M9902 Segmental and somatic dysfunction of thoracic region: Secondary | ICD-10-CM | POA: Diagnosis not present

## 2021-06-13 DIAGNOSIS — M4726 Other spondylosis with radiculopathy, lumbar region: Secondary | ICD-10-CM | POA: Diagnosis not present

## 2021-06-13 DIAGNOSIS — M25552 Pain in left hip: Secondary | ICD-10-CM | POA: Diagnosis not present

## 2021-06-14 DIAGNOSIS — M4722 Other spondylosis with radiculopathy, cervical region: Secondary | ICD-10-CM | POA: Diagnosis not present

## 2021-06-14 DIAGNOSIS — M4726 Other spondylosis with radiculopathy, lumbar region: Secondary | ICD-10-CM | POA: Diagnosis not present

## 2021-06-14 DIAGNOSIS — M9902 Segmental and somatic dysfunction of thoracic region: Secondary | ICD-10-CM | POA: Diagnosis not present

## 2021-06-14 DIAGNOSIS — M25552 Pain in left hip: Secondary | ICD-10-CM | POA: Diagnosis not present

## 2021-06-16 ENCOUNTER — Other Ambulatory Visit: Payer: Self-pay | Admitting: Physician Assistant

## 2021-06-16 DIAGNOSIS — G43911 Migraine, unspecified, intractable, with status migrainosus: Secondary | ICD-10-CM

## 2021-06-18 DIAGNOSIS — M25552 Pain in left hip: Secondary | ICD-10-CM | POA: Diagnosis not present

## 2021-06-18 DIAGNOSIS — M4726 Other spondylosis with radiculopathy, lumbar region: Secondary | ICD-10-CM | POA: Diagnosis not present

## 2021-06-18 DIAGNOSIS — M9902 Segmental and somatic dysfunction of thoracic region: Secondary | ICD-10-CM | POA: Diagnosis not present

## 2021-06-18 DIAGNOSIS — M4722 Other spondylosis with radiculopathy, cervical region: Secondary | ICD-10-CM | POA: Diagnosis not present

## 2021-06-18 NOTE — Telephone Encounter (Signed)
Last written 04/18/2021 #90 with 1 refill ?Last appt 04/02/2021 ?

## 2021-06-21 ENCOUNTER — Ambulatory Visit (INDEPENDENT_AMBULATORY_CARE_PROVIDER_SITE_OTHER): Payer: BC Managed Care – PPO | Admitting: Psychiatry

## 2021-06-21 ENCOUNTER — Encounter (HOSPITAL_COMMUNITY): Payer: Self-pay | Admitting: Psychiatry

## 2021-06-21 DIAGNOSIS — F411 Generalized anxiety disorder: Secondary | ICD-10-CM

## 2021-06-21 DIAGNOSIS — F410 Panic disorder [episodic paroxysmal anxiety] without agoraphobia: Secondary | ICD-10-CM

## 2021-06-21 DIAGNOSIS — F0630 Mood disorder due to known physiological condition, unspecified: Secondary | ICD-10-CM

## 2021-06-21 DIAGNOSIS — F332 Major depressive disorder, recurrent severe without psychotic features: Secondary | ICD-10-CM | POA: Diagnosis not present

## 2021-06-21 DIAGNOSIS — F1021 Alcohol dependence, in remission: Secondary | ICD-10-CM | POA: Diagnosis not present

## 2021-06-21 MED ORDER — VENLAFAXINE HCL ER 37.5 MG PO CP24: 37.5000 mg | ORAL_CAPSULE | Freq: Every day | ORAL | 1 refills | Status: DC

## 2021-06-21 NOTE — Progress Notes (Signed)
Psychiatric Initial Adult Assessment   Patient Identification: Deanna Keller MRN:  [id number] Date of Evaluation:  06/21/2021 Referral Source: Samuel Bouche Chief Complaint:   Chief Complaint  Patient presents with   Depression   Establish Care   Visit Diagnosis:    ICD-10-CM   1. Mood disorder in conditions classified elsewhere  F06.30     2. GAD (generalized anxiety disorder)  F41.1     3. Panic attacks  F41.0     4. Recovering alcoholic in remission ([place])  F10.21     5. Severe episode of recurrent major depressive disorder, without psychotic features [facility])  F33.2      Virtual Visit via Video Note  I connected with [name] on 06/21/21 at  2:00 PM EST by a video enabled telemedicine application and verified that I am speaking with the correct person using two identifiers.  Location: Patient: home Provider: home office   I discussed the limitations of evaluation and management by telemedicine and the availability of in person appointments. The patient expressed understanding and agreed to proceed.     I discussed the assessment and treatment plan with the patient. The patient was provided an opportunity to ask questions and all were answered. The patient agreed with the plan and demonstrated an understanding of the instructions.   The patient was advised to call back or seek an in-person evaluation if the symptoms worsen or if the condition fails to improve as anticipated.  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  She has been seen in this clinic nearly 3 years ago and was being treated with Lexapro, Lamictal later on she follows with mood center and  apparently Lamictal was tapered down and Xanax was started she has had a seizure and states she is now back on Lamictal.  She follows with primary care physician for her back condition and has recently been started on gabapentin also on Topamax there is also some concern that she was on Wellbutrin at the time of seizure she is not taking that now   She gives a long history of having depression and anxiety.  She has had a gastric bypass surgery nearly 15 years ago.  She has been on different medication: Lexapro and Wellbutrin in the past.  Currently she is on Effexor still describes her depression to be not manageable she still feels down decreased energy despair feeling of despair and sadness but not wanting to go out and do things.  She is having also difficult relationship with her husband and poor communication.  She does not feel suicidal or have any psychotic symptoms  She also endorses worries, excessive she apparently has had panic attacks when she would go out and she worries about her real state job or finances and relationship.  She has been taking Xanax now for the last 2 years 1 mg 2 tablets a day we discussed in detail about tapering it down considering she has had alcohol use disorder although she remains sober for the last 12 years plus We also discussed benzodiazepine effect which can worsen depression and also cause a motivation and distraction. She has had a gastric bypass surgery nearly 15 years ago.  Recently  over the last few months she has lost more weight but then gained weight back when she was put on prednisone.  She is following with her providers and working to her pain she is currently not on any narcotic tablets.  In the past she has wanted to be on pain medications and took herself off from Xanax but then according to the notes from the provider she want to get back on Xanax because she was not able to function without that and her anxiety got worse and decided not to take the  pain medication instead  No clear history of manic symptoms or hypomanic symptoms  She has been admitted for detox 12 years ago but no other history of admission for depression or suicidal thoughts  Aggravating factors; marriage, poor communication or pain condition.  Low marked for real state  Modifying factors; her kid.  Duration more than 15 years ago  Severity remains depressed and a motivated but not hopeless or suicidal    Past Psychiatric History: depression, alcohol use, anxiety  Previous Psychotropic Medications: Yes   Substance Abuse History in the last 12 months:  No.  Consequences of Substance Abuse: Remains sober 12 years   Past Medical History:  Past Medical History:  Diagnosis Date   Alcohol abuse    sober since 2011   Anxiety    Seizure disorder ([place]) 12/28/2018    Past Surgical History:  Procedure Laterality Date   CERVICAL CONE BIOPSY     GASTRIC BYPASS  2005    Family Psychiatric History: depression and anxiety, mom depression  Family History:  Family History  Problem Relation Age of Onset   Colon cancer Mother    Depression Mother    Hypertension Father     Social History:   Social History   Socioeconomic History   Marital status: Married    Spouse name: Not on file   Number of children: Not on file   Years of education: Not on file   Highest education level: Not on file  Occupational History   Not on file  Tobacco Use   Smoking status: Former    Packs/day: 0.50    Years: 10.00    Pack years: 5.00    Types: E-cigarettes, Cigarettes    Start date: 07/21/2012   Smokeless tobacco: Current   Tobacco comments:    uses nicotine pouches sometimes   Vaping Use   Vaping Use: Never used  Substance and Sexual Activity   Alcohol use: No   Drug use: No   Sexual activity: Yes    Partners: Male  Other Topics Concern   Not on file  Social History Narrative   Right handed   Caffeine maybe 1 cup/day   Lives at home with husband and 51  year old son    Social Determinants of Health   Financial Resource Strain: Not on file  Food Insecurity: Not on file  Transportation Needs: Not on file  Physical Activity: Not on file  Stress: Not on file  Social Connections: Not on file    Additional Social History: grew up with parents, no abuse somewhat emotionally difficult with parents   Allergies:   Allergies  Allergen Reactions   Augmentin [Amoxicillin-Pot Clavulanate] Nausea And Vomiting   Tramadol Other (See Comments)    Possible seizure   Wellbutrin [Bupropion]     Seizure    Metabolic Disorder Labs: Lab Results  Component Value Date   HGBA1C 4.9 02/20/2021   MPG 94 02/20/2021  Lab Results  Component Value Date   PROLACTIN 4.8 12/25/2018   Lab Results  Component Value Date   CHOL 205 (H) 02/20/2021   TRIG 45 02/20/2021   HDL 92 02/20/2021   CHOLHDL 2.2 02/20/2021   LDLCALC 100 (H) 02/20/2021   LDLCALC 82 04/05/2020   Lab Results  Component Value Date   TSH 1.63 12/25/2018    Therapeutic Level Labs: No results found for: LITHIUM No results found for: CBMZ No results found for: VALPROATE  Current Medications: Current Outpatient Medications  Medication Sig Dispense Refill   venlafaxine XR (EFFEXOR XR) 37.5 MG 24 hr capsule Take 1 capsule (37.5 mg total) by mouth daily with breakfast. 30 capsule 1   ALPRAZolam (XANAX) 1 MG tablet Take 1 tablet (1 mg total) by mouth 2 (two) times daily. 60 tablet 1   BD INSULIN SYRINGE U/F 31G X 5/16" 1 ML MISC USE ONCE WITH VITAMIN B12 INJECTIONS 100 each 12   Cyanocobalamin 1000 MCG/ML KIT Inject 1 mL every two weeks. 20 kit 2   cyclobenzaprine (FLEXERIL) 10 MG tablet TAKE 1 TABLET BY MOUTH THREE TIMES A DAY AS NEEDED FOR MUSCLE SPASMS 90 tablet 1   gabapentin (NEURONTIN) 600 MG tablet Take 1 tablet (600 mg total) by mouth 3 (three) times daily. 270 tablet 3   HYDROcodone-acetaminophen (NORCO/VICODIN) 5-325 MG tablet Take 1 tablet by mouth every 8 (eight) hours as  needed for moderate pain. 30 tablet 0   Insulin Pen Needle 31G X 6 MM MISC Use once with vitamin B12 injections 100 each 0   LamoTRIgine (LAMICTAL XR) 200 MG TB24 24 hour tablet Take 2 tablets (400 mg total) by mouth daily. 180 tablet 1   linaclotide (LINZESS) 145 MCG CAPS capsule Take 1 capsule (145 mcg total) by mouth daily before breakfast. 90 capsule 0   venlafaxine XR (EFFEXOR XR) 150 MG 24 hr capsule Take 1 capsule (150 mg total) by mouth daily with breakfast. 90 capsule 1   No current facility-administered medications for this visit.    Psychiatric Specialty Exam: Review of Systems  Cardiovascular:  Negative for chest pain.  Neurological:  Negative for tremors.  Psychiatric/Behavioral:  Positive for decreased concentration and dysphoric mood. Negative for agitation and self-injury.    There were no vitals taken for this visit.There is no height or weight on file to calculate BMI.  General Appearance: Casual  Eye Contact:  Fair  Speech:  Clear and Coherent  Volume:  Decreased  Mood:   subdued  Affect:  Constricted  Thought Process:  Goal Directed  Orientation:  Full (Time, Place, and Person)  Thought Content:  Rumination  Suicidal Thoughts:  No  Homicidal Thoughts:  No  Memory:  Immediate;   Fair  Judgement:  Fair  Insight:  Shallow  Psychomotor Activity:  Decreased  Concentration:  Concentration: Fair  Recall:  [place]: Fair  Akathisia:  No  Handed:    AIMS (if indicated):  not done  Assets:  Desire for Improvement Financial Resources/Insurance Housing Leisure Time Social Support  ADL's:  Intact  Cognition: WNL  Sleep:  Fair   Screenings: GAD-7    [employer] Office Visit from 01/08/2021 in [place] Office Visit from 04/03/2020 in [place] Video Visit from 09/17/2019 in [place] Video Visit from 07/26/2019 in [place] Video Visit from 11/13/2018  in [place]  Total GAD-7 Score 16 13 10 16 15       PHQ2-9    [place] Office Visit from 06/21/2021 in [place] Office Visit from 01/08/2021 in [place] Office Visit from 04/03/2020 in [place] Video Visit from 09/17/2019 in [place] Video Visit from 07/26/2019 in [place]  PHQ-2 Total Score 3 2 2 4 6   PHQ-9 Total Score 15 8 7 6 16       [place] Office Visit from 06/21/2021 in [place] No Risk       Assessment and Plan: as folllwos  Mood disorder: secondary to pain and circumstances ; she is on lamictal, topomax and gabapentin for seizures, discsuss its effect and treatment for mood   Depressive disorder recurrent moderate to severe: continue effexor 157m, will add 37.520mfor depression, consider therapy  GAD with panic attacks: agree to taper down quarter of dose for one week and then lower further quarter. She will be on 80m35mnd half in the next week till next appointment, increase effexor by 37.5mg2mnsider therapy and breathing techniques Also discussed benzodiazepine effect on a motivation and depression Alcohol use : remains sober, discussed relapse prevention and is attending AA   Collaboration of Care: Other have sent note to primary care in regard to xanax taper and dose ,   Patient/Guardian was advised Release of Information must be obtained prior to any record release in order to collaborate their care with an outside provider. Patient/Guardian was advised if they have not already done so to contact the registration department to sign all necessary forms in order for us t[place] information regarding  their care.   Consent: Patient/Guardian gives verbal consent for treatment and assignment of benefits for services provided during this visit. Patient/Guardian expressed understanding and agreed to proceed.  FU 4 -5 weeks or earlier if needed  NadeMerian Capron 3/9/20232:38 PM

## 2021-06-25 DIAGNOSIS — M25552 Pain in left hip: Secondary | ICD-10-CM | POA: Diagnosis not present

## 2021-06-25 DIAGNOSIS — M4726 Other spondylosis with radiculopathy, lumbar region: Secondary | ICD-10-CM | POA: Diagnosis not present

## 2021-06-25 DIAGNOSIS — M4722 Other spondylosis with radiculopathy, cervical region: Secondary | ICD-10-CM | POA: Diagnosis not present

## 2021-06-25 DIAGNOSIS — M9902 Segmental and somatic dysfunction of thoracic region: Secondary | ICD-10-CM | POA: Diagnosis not present

## 2021-06-28 ENCOUNTER — Encounter: Payer: Self-pay | Admitting: Physician Assistant

## 2021-06-28 DIAGNOSIS — M4722 Other spondylosis with radiculopathy, cervical region: Secondary | ICD-10-CM | POA: Diagnosis not present

## 2021-06-28 DIAGNOSIS — M25552 Pain in left hip: Secondary | ICD-10-CM | POA: Diagnosis not present

## 2021-06-28 DIAGNOSIS — M4726 Other spondylosis with radiculopathy, lumbar region: Secondary | ICD-10-CM | POA: Diagnosis not present

## 2021-06-28 DIAGNOSIS — M9902 Segmental and somatic dysfunction of thoracic region: Secondary | ICD-10-CM | POA: Diagnosis not present

## 2021-07-02 ENCOUNTER — Other Ambulatory Visit: Payer: Self-pay | Admitting: Physician Assistant

## 2021-07-02 MED ORDER — ALPRAZOLAM 0.5 MG PO TABS: ORAL_TABLET | ORAL | 0 refills | Status: DC

## 2021-07-04 DIAGNOSIS — M25552 Pain in left hip: Secondary | ICD-10-CM | POA: Diagnosis not present

## 2021-07-04 DIAGNOSIS — M4722 Other spondylosis with radiculopathy, cervical region: Secondary | ICD-10-CM | POA: Diagnosis not present

## 2021-07-04 DIAGNOSIS — M9902 Segmental and somatic dysfunction of thoracic region: Secondary | ICD-10-CM | POA: Diagnosis not present

## 2021-07-04 DIAGNOSIS — M4726 Other spondylosis with radiculopathy, lumbar region: Secondary | ICD-10-CM | POA: Diagnosis not present

## 2021-07-09 DIAGNOSIS — M25552 Pain in left hip: Secondary | ICD-10-CM | POA: Diagnosis not present

## 2021-07-09 DIAGNOSIS — M4722 Other spondylosis with radiculopathy, cervical region: Secondary | ICD-10-CM | POA: Diagnosis not present

## 2021-07-09 DIAGNOSIS — M4726 Other spondylosis with radiculopathy, lumbar region: Secondary | ICD-10-CM | POA: Diagnosis not present

## 2021-07-09 DIAGNOSIS — M9902 Segmental and somatic dysfunction of thoracic region: Secondary | ICD-10-CM | POA: Diagnosis not present

## 2021-07-11 DIAGNOSIS — M9902 Segmental and somatic dysfunction of thoracic region: Secondary | ICD-10-CM | POA: Diagnosis not present

## 2021-07-11 DIAGNOSIS — M25552 Pain in left hip: Secondary | ICD-10-CM | POA: Diagnosis not present

## 2021-07-11 DIAGNOSIS — M4722 Other spondylosis with radiculopathy, cervical region: Secondary | ICD-10-CM | POA: Diagnosis not present

## 2021-07-11 DIAGNOSIS — M4726 Other spondylosis with radiculopathy, lumbar region: Secondary | ICD-10-CM | POA: Diagnosis not present

## 2021-07-24 ENCOUNTER — Encounter: Payer: Self-pay | Admitting: Neurology

## 2021-07-24 DIAGNOSIS — Z6834 Body mass index (BMI) 34.0-34.9, adult: Secondary | ICD-10-CM | POA: Diagnosis not present

## 2021-07-24 DIAGNOSIS — Z9884 Bariatric surgery status: Secondary | ICD-10-CM | POA: Diagnosis not present

## 2021-07-24 DIAGNOSIS — E669 Obesity, unspecified: Secondary | ICD-10-CM | POA: Diagnosis not present

## 2021-07-24 DIAGNOSIS — R569 Unspecified convulsions: Secondary | ICD-10-CM | POA: Diagnosis not present

## 2021-07-25 ENCOUNTER — Encounter: Payer: Self-pay | Admitting: Physician Assistant

## 2021-07-25 DIAGNOSIS — M4726 Other spondylosis with radiculopathy, lumbar region: Secondary | ICD-10-CM | POA: Diagnosis not present

## 2021-07-25 DIAGNOSIS — M4722 Other spondylosis with radiculopathy, cervical region: Secondary | ICD-10-CM | POA: Diagnosis not present

## 2021-07-25 DIAGNOSIS — M25552 Pain in left hip: Secondary | ICD-10-CM | POA: Diagnosis not present

## 2021-07-25 DIAGNOSIS — M9902 Segmental and somatic dysfunction of thoracic region: Secondary | ICD-10-CM | POA: Diagnosis not present

## 2021-07-25 NOTE — Telephone Encounter (Signed)
Is let her know that I think I would be happy to take over the gabapentin prescription once it runs out.  I would encourage her to complete all of the refills currently on file.  In regards to the Effexor this needs to come from Dr. Gilmore Laroche.  So encouraged her to reach out to his office since he is the one adjusting and managing it he really needs to be the one writing for it.  If at some point later this year she feels very stable on her regimen were more than happy to take back over. ?

## 2021-07-25 NOTE — Telephone Encounter (Signed)
I called the patient and left message (ok per DPR). Notified her that Dr. Teresa Coombs is out of the office this week. He will address her question when he returns.  ?

## 2021-07-26 ENCOUNTER — Other Ambulatory Visit (HOSPITAL_COMMUNITY): Payer: Self-pay

## 2021-07-26 MED ORDER — VENLAFAXINE HCL ER 150 MG PO CP24: 150.0000 mg | ORAL_CAPSULE | Freq: Every day | ORAL | 0 refills | Status: DC

## 2021-07-26 NOTE — Telephone Encounter (Signed)
I called the pharmacy. They state she picked up 270 # on 05/22/2021. I called patient. She states she can not find the prescription. I told her she will have to pay out of pocket.  ?

## 2021-07-28 ENCOUNTER — Other Ambulatory Visit: Payer: Self-pay | Admitting: Physician Assistant

## 2021-07-30 ENCOUNTER — Encounter: Payer: Self-pay | Admitting: Physician Assistant

## 2021-07-30 MED ORDER — ALPRAZOLAM 0.5 MG PO TABS: ORAL_TABLET | ORAL | 0 refills | Status: DC

## 2021-08-01 DIAGNOSIS — M9902 Segmental and somatic dysfunction of thoracic region: Secondary | ICD-10-CM | POA: Diagnosis not present

## 2021-08-01 DIAGNOSIS — M4722 Other spondylosis with radiculopathy, cervical region: Secondary | ICD-10-CM | POA: Diagnosis not present

## 2021-08-01 DIAGNOSIS — M4726 Other spondylosis with radiculopathy, lumbar region: Secondary | ICD-10-CM | POA: Diagnosis not present

## 2021-08-01 DIAGNOSIS — M25552 Pain in left hip: Secondary | ICD-10-CM | POA: Diagnosis not present

## 2021-08-07 ENCOUNTER — Telehealth (INDEPENDENT_AMBULATORY_CARE_PROVIDER_SITE_OTHER): Payer: BC Managed Care – PPO | Admitting: Psychiatry

## 2021-08-07 ENCOUNTER — Encounter (HOSPITAL_COMMUNITY): Payer: Self-pay | Admitting: Psychiatry

## 2021-08-07 DIAGNOSIS — F410 Panic disorder [episodic paroxysmal anxiety] without agoraphobia: Secondary | ICD-10-CM

## 2021-08-07 DIAGNOSIS — F0630 Mood disorder due to known physiological condition, unspecified: Secondary | ICD-10-CM | POA: Diagnosis not present

## 2021-08-07 DIAGNOSIS — F411 Generalized anxiety disorder: Secondary | ICD-10-CM

## 2021-08-07 NOTE — Progress Notes (Signed)
[place] follow up visit ? ?Patient Identification: Deanna Keller ?MRN:  [id number] ?Date of Evaluation:  08/07/2021 ?Referral Source: Samuel Bouche ?Chief Complaint:   ?No chief complaint on file. ? ?Visit Diagnosis:  ?  ICD-10-CM   ?1. Mood disorder in conditions classified elsewhere  F06.30   ?  ?2. GAD (generalized anxiety disorder)  F41.1   ?  ?3. Panic attacks  F41.0   ?  ? ?Virtual Visit via Video Note ? ?I connected with Deanna Keller on 08/07/21 at 11:00 AM EDT by a video enabled telemedicine application and verified that I am speaking with the correct person using two identifiers. ? ?Location: ?Patient: home ?Provider: office ?  ?I discussed the limitations of evaluation and management by telemedicine and the availability of in person appointments. The patient expressed understanding and agreed to proceed. ? ? ?  ?I discussed the assessment and treatment plan with the patient. The patient was provided an opportunity to ask questions and all were answered. The patient agreed with the plan and demonstrated an understanding of the instructions. ?  ?The patient was advised to call back or seek an in-person evaluation if the symptoms worsen or if the condition fails to improve as anticipated. ? ?I provided 20 minutes of non-face-to-face time during this encounter. ? ? ? 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 ? ? ? ? ?She gives a long history of having depression and anxiety.  She has had a gastric bypass surgery nearly 15 years ago.  She has been on different medication: Lexapro and Wellbutrin in the past. ? ?Last visit increased effexor by 37.57m some better in anxiety, also xanax is now 0.544mtid ?She plans to lower gaba after discussion with provider to help with weight and fogginess ? ?Not on  opiods ? ? ?No clear history of manic symptoms or hypomanic symptoms ? ?She has been admitted for detox 12 years ago but no other history of admission for depression or suicidal thoughts ? ?Aggravating factors; poor communication at home, pain condition.  Real estate work ? ?Modifying factors; her kid ? ?Duration more than 15 years ago ? ?Severity r: anxiety manageable but remains amotivated, subdued some ? ? ? ?Past Psychiatric History: depression, alcohol use, anxiety ? ?Previous Psychotropic Medications: Yes  ? ?Substance Abuse History in the last 12 months:  No. ? ?Consequences of Substance Abuse: ?Remains sober 12 years  ? ?Past Medical History:  ?Past Medical History:  ?Diagnosis Date  ? Alcohol abuse   ? sober since 2011  ? Anxiety   ? [place]  ?  ?Past Surgical History:  ?Procedure Laterality Date  ? CERVICAL CONE BIOPSY    ? GASTRIC BYPASS  2005  ? ? ?Family Psychiatric History: depression and anxiety, mom depression ? ?Family History:  ?Family History  ?Problem Relation Age of Onset  ? Colon cancer Mother   ? Depression Mother   ? Hypertension Father   ? ? ?Social History:   ?Social History  ? ?Socioeconomic History  ? Marital status: Married  ?  Spouse name: Not on file  ? Number of children: Not on file  ? Years of education: Not on file  ? Highest education level: Not on file  ?Occupational History  ? Not on file  ?Tobacco  Use  ? Smoking status: Former  ?  Packs/day: 0.50  ?  Years: 10.00  ?  Pack years: 5.00  ?  Types: E-cigarettes, Cigarettes  ?  Start date: 07/21/2012  ? Smokeless tobacco: Current  ? Tobacco comments:  ?  uses nicotine pouches sometimes   ?Vaping Use  ? Vaping Use: Never used  ?Substance and Sexual Activity  ? Alcohol use: No  ? Drug use: No  ? Sexual activity: Yes  ?  Partners: Male  ?Other Topics Concern  ? Not on file  ?Social History Narrative  ? Right handed  ? Caffeine maybe 1 cup/day  ? Lives at home with husband and 42 year old son   ? ?Social  Determinants of Health  ? ?Financial Resource Strain: Not on file  ?Food Insecurity: Not on file  ?Transportation Needs: Not on file  ?Physical Activity: Not on file  ?Stress: Not on file  ?Social Connections: Not on file  ? ? ?Allergies:   ?Allergies  ?Allergen Reactions  ? Augmentin [Amoxicillin-Pot Clavulanate] Nausea And Vomiting  ? Tramadol Other (See Comments)  ?  Possible seizure  ? Wellbutrin [Bupropion]   ?  Seizure  ? ? ?Metabolic Disorder Labs: ?Lab Results  ?Component Value Date  ? HGBA1C 4.9 02/20/2021  ? MPG 94 02/20/2021  ? ?Lab Results  ?Component Value Date  ? PROLACTIN 4.8 12/25/2018  ? ?Lab Results  ?Component Value Date  ? CHOL 205 (H) 02/20/2021  ? TRIG 45 02/20/2021  ? HDL 92 02/20/2021  ? CHOLHDL 2.2 02/20/2021  ? LDLCALC 100 (H) 02/20/2021  ? [place] 82 04/05/2020  ? ?Lab Results  ?Component Value Date  ? TSH 1.63 12/25/2018  ? ? ?Therapeutic Level Labs: ?No results found for: LITHIUM ?No results found for: CBMZ ?No results found for: VALPROATE ? ?Current Medications: ?Current Outpatient Medications  ?Medication Sig Dispense Refill  ? ALPRAZolam (XANAX) 0.5 MG tablet Take one tablet up to three times a day for anxiety. 90 tablet 0  ? ALPRAZolam (XANAX) 1 MG tablet Take 1 tablet (1 mg total) by mouth 2 (two) times daily. 60 tablet 1  ? BD INSULIN SYRINGE U/F 31G X 5/16" 1 ML MISC USE ONCE WITH VITAMIN B12 INJECTIONS 100 each 12  ? Cyanocobalamin 1000 MCG/ML KIT Inject 1 mL every two weeks. 20 kit 2  ? cyclobenzaprine (FLEXERIL) 10 MG tablet TAKE 1 TABLET BY MOUTH THREE TIMES A DAY AS NEEDED FOR MUSCLE SPASMS 90 tablet 1  ? gabapentin (NEURONTIN) 600 MG tablet Take 1 tablet (600 mg total) by mouth 3 (three) times daily. 270 tablet 3  ? Insulin Pen Needle 31G X 6 MM MISC Use once with vitamin B12 injections 100 each 0  ? LamoTRIgine (LAMICTAL XR) 200 MG TB24 24 hour tablet Take 2 tablets (400 mg total) by mouth daily. 180 tablet 1  ? venlafaxine XR (EFFEXOR XR) 150 MG 24 hr capsule Take 1  capsule (150 mg total) by mouth daily with breakfast. 30 capsule 0  ? venlafaxine XR (EFFEXOR XR) 37.5 MG 24 hr capsule Take 1 capsule (37.5 mg total) by mouth daily with breakfast. 30 capsule 1  ? ?No current facility-administered medications for this visit.  ? ? ?Psychiatric Specialty Exam: ?Review of Systems  ?Cardiovascular:  Negative for chest pain.  ?Skin:  Negative for rash.  ?Neurological:  Negative for tremors.  ?Psychiatric/Behavioral:  Negative for agitation and self-injury.    ?There were no vitals taken for this visit.There is no height  or weight on file to calculate BMI.  ?General Appearance: Casual  ?Eye Contact:  Fair  ?Speech:  Clear and Coherent  ?Volume:  Decreased  ?Mood:   somewhat subdued  ?Affect:  Constricted  ?Thought Process:  Goal Directed  ?Orientation:  Full (Time, Place, and Person)  ?Thought Content:  Rumination  ?Suicidal Thoughts:  No  ?Homicidal Thoughts:  No  ?Memory:  Immediate;   Fair  ?Judgement:  Fair  ?Insight:  Shallow  ?Psychomotor Activity:  Decreased  ?Concentration:  Concentration: Fair  ?Recall:  Fair  ?[place]  ?Language: Fair  ?Akathisia:  No  ?Handed:    ?AIMS (if indicated):  not done  ?Assets:  Desire for Improvement ?Financial Resources/Insurance ?Housing ?Leisure Time ?Social Support  ?ADL's:  Intact  ?Cognition: WNL  ?Sleep:  Fair  ? ?Screenings: ?GAD-7   ? ?[place] Office Visit from 01/08/2021 in [place] Office Visit from 04/03/2020 in [place] Video Visit from 09/17/2019 in [place] Video Visit from 07/26/2019 in [place] Video Visit from 11/13/2018 in [place]  ?Total GAD-7 Score _0 ? ?  ? ?PHQ2-9   ? ?[place] Office Visit from 06/21/2021 in [place] Office Visit from 01/08/2021 in [place] Office Visit from 04/03/2020 in [place] Video Visit from 09/17/2019 in [place] Video Visit from 07/26/2019 in [place]

## 2021-08-08 ENCOUNTER — Ambulatory Visit (INDEPENDENT_AMBULATORY_CARE_PROVIDER_SITE_OTHER): Payer: BC Managed Care – PPO | Admitting: Adult Health

## 2021-08-08 ENCOUNTER — Encounter: Payer: Self-pay | Admitting: Adult Health

## 2021-08-08 VITALS — BP 108/76 | HR 91 | Ht 65.0 in | Wt 209.8 lb

## 2021-08-08 DIAGNOSIS — G40909 Epilepsy, unspecified, not intractable, without status epilepticus: Secondary | ICD-10-CM

## 2021-08-08 NOTE — Progress Notes (Signed)
? ? ?PATIENT: Deanna Keller ?DOB: 1980-12-30 ? ?REASON FOR VISIT: follow up ?HISTORY FROM: patient ?PRIMARY NEUROLOGIST: Dr. April Manson ? ?Chief Complaint  ?Patient presents with  ? Follow-up  ?  Rm 8, seizures (no).   ? ? ? ?HISTORY OF PRESENT ILLNESS: ?Today 08/08/21: ? ?Deanna Keller is a 41 year old female with a history of seizures.  She returns today for follow-up.  She is currently on Lamictal XR 400 mg twice a day.  She denies any seizure events.  Reports that last seizure was when she decrease her dose of Lamictal.  The patient states that she is on gabapentin 600 mg 3 times a day.  She reports that she has gained weight on this medication and would like it discontinued.  She would like our office to instruct her on the weaning process as she is very fearful of having a breakthrough seizure.  She was also recently placed on Topamax 100 mg twice a day as an appetite suppressant.  Patient acknowledges when she was on Lamictal in the past she did not have any breakthrough seizures until she was instructed to decrease her dose. ? ?HISTORY ( copied from Dr. April Manson) Patient presents today for follow-up, last visit was on June 2022. At that time she reported 2 additional seizures after her lamotrigine was decreased from 400 mg a day to 300 mg a day.  Since then her lamotrigine has been increased back to 400 mg daily.  Since her last visit she denies any additional seizures.  She reported that her mood is better, sleep is better, she cut down on her caffeine and cut down on her work.  Currently she does not have any complaint or concern but just wanted to know the level of Lamictal and gabapentin.  Last seizure was May 2022 ?  ?  ?Initial history from Dr Jannifer Franklin:  ?Deanna Keller is a 41 year old right-handed white female with a history of seizures 4 years ago while taking Ultram.  She had 2 seizures at that time, she was placed on Lamictal, she claims she had an EEG study and she believes she had an MRI but she is not sure,  the records support that she had a noncontrast CT scan of the head for a concussion in 2013.  The patient was placed on Lamictal, she has been on 300 mg a day of this medication since that time but she does not always take the medication properly, she misses doses frequently.  She was placed on Wellbutrin for weight loss, 8 days ago she suffered another seizure, she was in the car with her husband who was driving and the patient had a witnessed event.  The patient started with a confusional event, she was talking nonsense and then went into a stiffening event, she did not bite her tongue or lose control of bowels or the bladder.  The event lasted about 1 minute.  The patient had no recollection of events during that time.  She has been increased on the Lamictal taking 200 mg twice daily.  She was taken off of the Wellbutrin.  She is sent to this office for an evaluation.  She denies any headaches, dizziness, vision changes, or any numbness or weakness of the face, arms, legs.  She denies any balance problems.  She does have a prior history of alcohol abuse but she has not had anything to drink in 8 years.  She denies any illicit drug use such as cocaine or marijuana.  She does use Xanax  in low-dose, taking 0.5 mg twice daily.  The patient does report chronic fatigue issues and daytime drowsiness. ?  ?  ?Handedness: Right handed  ?  ?Seizure Type: passing out, being stiff, clinched teeth, unconscious  ?  ?Current frequency: Last seizure in May 2022 ?  ?Any injuries from seizures: None  ?  ?Seizure risk factors: None  ?  ?Previous ASMs: Lamotrigine, Gabapentin  ?  ?Currenty ASMs: Lamotrigine 400 mg daily and Gabapentin 100 mg TID  ?  ?ASMs side effects: None  ?  ?Brain Images: Nonspecific white matter hyperintensities ?  ?Previous EEGs: Normal routine EEG ?  ?  ?OTHER MEDICAL CONDITIONS: Anxiety/Depression, Seizure, Gastric bypass surgery  ? ?REVIEW OF SYSTEMS: Out of a complete 14 system review of symptoms, the  patient complains only of the following symptoms, and all other reviewed systems are negative. ? ?ALLERGIES: ?Allergies  ?Allergen Reactions  ? Augmentin [Amoxicillin-Pot Clavulanate] Nausea And Vomiting  ? Tramadol Other (See Comments)  ?  Possible seizure  ? Wellbutrin [Bupropion]   ?  Seizure  ? ? ?HOME MEDICATIONS: ?Outpatient Medications Prior to Visit  ?Medication Sig Dispense Refill  ? ALPRAZolam (XANAX) 1 MG tablet Take 1 tablet (1 mg total) by mouth 2 (two) times daily. 60 tablet 1  ? BD INSULIN SYRINGE U/F 31G X 5/16" 1 ML MISC USE ONCE WITH VITAMIN B12 INJECTIONS 100 each 12  ? Cyanocobalamin 1000 MCG/ML KIT Inject 1 mL every two weeks. 20 kit 2  ? cyclobenzaprine (FLEXERIL) 10 MG tablet TAKE 1 TABLET BY MOUTH THREE TIMES A DAY AS NEEDED FOR MUSCLE SPASMS 90 tablet 1  ? gabapentin (NEURONTIN) 600 MG tablet Take 1 tablet (600 mg total) by mouth 3 (three) times daily. 270 tablet 3  ? Insulin Pen Needle 31G X 6 MM MISC Use once with vitamin B12 injections 100 each 0  ? LamoTRIgine (LAMICTAL XR) 200 MG TB24 24 hour tablet Take 2 tablets (400 mg total) by mouth daily. 180 tablet 1  ? venlafaxine XR (EFFEXOR XR) 150 MG 24 hr capsule Take 1 capsule (150 mg total) by mouth daily with breakfast. 30 capsule 0  ? venlafaxine XR (EFFEXOR XR) 37.5 MG 24 hr capsule Take 1 capsule (37.5 mg total) by mouth daily with breakfast. 30 capsule 1  ? ALPRAZolam (XANAX) 0.5 MG tablet Take one tablet up to three times a day for anxiety. 90 tablet 0  ? ?No facility-administered medications prior to visit.  ? ? ?PAST MEDICAL HISTORY: ?Past Medical History:  ?Diagnosis Date  ? Alcohol abuse   ? sober since 2011  ? Anxiety   ? Contusion   ? tail bone contusion in Nov 2022  ? Seizure disorder ([place]) 12/28/2018  ? ? ?PAST SURGICAL HISTORY: ?Past Surgical History:  ?Procedure Laterality Date  ? CERVICAL CONE BIOPSY    ? GASTRIC BYPASS  2005  ? ? ?FAMILY HISTORY: ?Family History  ?Problem Relation Age of Onset  ? Colon cancer Mother   ?  Depression Mother   ? Hypertension Father   ? ? ?SOCIAL HISTORY: ?Social History  ? ?Socioeconomic History  ? Marital status: Married  ?  Spouse name: Not on file  ? Number of children: Not on file  ? Years of education: Not on file  ? Highest education level: Not on file  ?Occupational History  ? Not on file  ?Tobacco Use  ? Smoking status: Former  ?  Packs/day: 0.50  ?  Years: 10.00  ?  Pack years:  5.00  ?  Types: E-cigarettes, Cigarettes  ?  Start date: 07/21/2012  ? Smokeless tobacco: Current  ? Tobacco comments:  ?  uses nicotine pouches sometimes   ?Vaping Use  ? Vaping Use: Never used  ?Substance and Sexual Activity  ? Alcohol use: No  ? Drug use: No  ? Sexual activity: Yes  ?  Partners: Male  ?Other Topics Concern  ? Not on file  ?Social History Narrative  ? Right handed  ? Caffeine maybe 1 cup/day  ? Lives at home with husband and 12 year old son   ? ?Social Determinants of Health  ? ?Financial Resource Strain: Not on file  ?Food Insecurity: Not on file  ?Transportation Needs: Not on file  ?Physical Activity: Not on file  ?Stress: Not on file  ?Social Connections: Not on file  ?Intimate Partner Violence: Not on file  ? ? ? ? ?PHYSICAL EXAM ? ?Vitals:  ? 08/08/21 1018  ?Weight: 209 lb 12.8 oz (95.2 kg)  ?Height: 5' 5"  (1.651 m)  ? ?Body mass index is 34.91 kg/m?. ? ?Generalized: Well developed, in no acute distress  ? ?Neurological examination  ?Mentation: Alert oriented to time, place, history taking. Follows all commands speech and language fluent ?Cranial nerve II-XII: Pupils were equal round reactive to light. Extraocular movements were full, visual field were full on confrontational test. Facial sensation and strength were normal. Head turning and shoulder shrug  were normal and symmetric. ?Motor: The motor testing reveals 5 over 5 strength of all 4 extremities. Good symmetric motor tone is noted throughout.  ?Sensory: Sensory testing is intact to soft touch on all 4 extremities. No evidence of extinction  is noted.  ?Coordination: Cerebellar testing reveals good finger-nose-finger and heel-to-shin bilaterally.  ?Gait and station: Gait is normal.  ?  ? ?DIAGNOSTIC DATA (LABS, IMAGING, TESTING) ?- I reviewed

## 2021-08-09 ENCOUNTER — Encounter: Payer: Self-pay | Admitting: Neurology

## 2021-08-09 ENCOUNTER — Encounter: Payer: Self-pay | Admitting: Adult Health

## 2021-08-09 LAB — COMPREHENSIVE METABOLIC PANEL
ALT: 71 IU/L — ABNORMAL HIGH (ref 0–32)
AST: 51 IU/L — ABNORMAL HIGH (ref 0–40)
Albumin/Globulin Ratio: 1.6 (ref 1.2–2.2)
Albumin: 4.4 g/dL (ref 3.8–4.8)
Alkaline Phosphatase: 83 IU/L (ref 44–121)
BUN/Creatinine Ratio: 18 (ref 9–23)
BUN: 14 mg/dL (ref 6–20)
Bilirubin Total: 0.3 mg/dL (ref 0.0–1.2)
CO2: 22 mmol/L (ref 20–29)
Calcium: 9.1 mg/dL (ref 8.7–10.2)
Chloride: 106 mmol/L (ref 96–106)
Creatinine, Ser: 0.79 mg/dL (ref 0.57–1.00)
Globulin, Total: 2.7 g/dL (ref 1.5–4.5)
Glucose: 85 mg/dL (ref 70–99)
Potassium: 4.5 mmol/L (ref 3.5–5.2)
Sodium: 140 mmol/L (ref 134–144)
Total Protein: 7.1 g/dL (ref 6.0–8.5)
eGFR: 98 mL/min/{1.73_m2} (ref >59)

## 2021-08-09 LAB — CBC WITH DIFFERENTIAL/PLATELET
Basophils Absolute: 0.1 10*3/uL (ref 0.0–0.2)
Basos: 1 %
EOS (ABSOLUTE): 0.2 10*3/uL (ref 0.0–0.4)
Eos: 6 %
Hematocrit: 37.3 % (ref 34.0–46.6)
Hemoglobin: 12.2 g/dL (ref 11.1–15.9)
Immature Grans (Abs): 0 10*3/uL (ref 0.0–0.1)
Immature Granulocytes: 0 %
Lymphocytes Absolute: 1.2 10*3/uL (ref 0.7–3.1)
Lymphs: 28 %
MCH: 28.5 pg (ref 26.6–33.0)
MCHC: 32.7 g/dL (ref 31.5–35.7)
MCV: 87 fL (ref 79–97)
Monocytes Absolute: 0.5 10*3/uL (ref 0.1–0.9)
Monocytes: 11 %
Neutrophils Absolute: 2.3 10*3/uL (ref 1.4–7.0)
Neutrophils: 54 %
Platelets: 364 10*3/uL (ref 150–450)
RBC: 4.28 x10E6/uL (ref 3.77–5.28)
RDW: 14.7 % (ref 11.7–15.4)
WBC: 4.2 10*3/uL (ref 3.4–10.8)

## 2021-08-09 LAB — LAMOTRIGINE LEVEL: Lamotrigine Lvl: 5.9 ug/mL (ref 2.0–20.0)

## 2021-08-14 NOTE — Progress Notes (Signed)
Please see the MyChart message reply(ies) for my assessment and plan.  ?  ?This patient gave consent for this Medical Advice Message and is aware that it may result in a bill to their insurance company, as well as the possibility of receiving a bill for a co-payment or deductible. They are an established patient, but are not seeking medical advice exclusively about a problem treated during an in person or video visit in the last seven days. I did not recommend an in person or video visit within seven days of my reply.  ?  ?I spent a total of 8 minutes cumulative time within 7 days through MyChart messaging. ? ?Courtnay Petrilla, MD   ?

## 2021-08-15 ENCOUNTER — Telehealth (HOSPITAL_COMMUNITY): Payer: Self-pay | Admitting: Psychiatry

## 2021-08-15 NOTE — Telephone Encounter (Signed)
At last visit you wanted patient to speak with her Neurologist about upping her effexor to 225mg  a day because she is struggling with depression.  ? ?She did get the ok for this.  ? ?At this time nothing further is needed.  ?

## 2021-08-23 DIAGNOSIS — M9902 Segmental and somatic dysfunction of thoracic region: Secondary | ICD-10-CM | POA: Diagnosis not present

## 2021-08-23 DIAGNOSIS — M25552 Pain in left hip: Secondary | ICD-10-CM | POA: Diagnosis not present

## 2021-08-23 DIAGNOSIS — M4722 Other spondylosis with radiculopathy, cervical region: Secondary | ICD-10-CM | POA: Diagnosis not present

## 2021-08-23 DIAGNOSIS — M4726 Other spondylosis with radiculopathy, lumbar region: Secondary | ICD-10-CM | POA: Diagnosis not present

## 2021-08-27 ENCOUNTER — Other Ambulatory Visit: Payer: Self-pay | Admitting: Physician Assistant

## 2021-08-27 ENCOUNTER — Telehealth (HOSPITAL_COMMUNITY): Payer: Self-pay

## 2021-08-27 NOTE — Telephone Encounter (Signed)
I left everything Dr. Gilmore Laroche stated above, on a secured vm for patient. ?

## 2021-08-27 NOTE — Telephone Encounter (Addendum)
Patient called this morning and states she is separating from her husband and has been stressed out. She is asking if Dr. Gilmore Laroche can reach out to  her PCP, Tandy Gaw, and recommend to keep the Alprazolam the same dose as it is and don't decrease it, just for one month. Currently it is 1mg  three times a day. ?

## 2021-08-27 NOTE — Telephone Encounter (Signed)
Xanax last written 05/11/2021 #60 with 1 refill ?Last appt December with Lesly Rubenstein, has seen Dr. Karie Schwalbe since that time. Please advise.  ?

## 2021-08-28 MED ORDER — ALPRAZOLAM 1 MG PO TABS
1.0000 mg | ORAL_TABLET | Freq: Two times a day (BID) | ORAL | 2 refills | Status: DC
Start: 2021-08-28 — End: 2021-09-21

## 2021-08-28 NOTE — Telephone Encounter (Signed)
Sent to pharmacy 

## 2021-08-28 NOTE — Telephone Encounter (Signed)
Doesn't look like prescription denied, looks like still pending. Please advise.  ?

## 2021-08-30 DIAGNOSIS — M4726 Other spondylosis with radiculopathy, lumbar region: Secondary | ICD-10-CM | POA: Diagnosis not present

## 2021-08-30 DIAGNOSIS — M9902 Segmental and somatic dysfunction of thoracic region: Secondary | ICD-10-CM | POA: Diagnosis not present

## 2021-08-30 DIAGNOSIS — M4722 Other spondylosis with radiculopathy, cervical region: Secondary | ICD-10-CM | POA: Diagnosis not present

## 2021-08-30 DIAGNOSIS — M25552 Pain in left hip: Secondary | ICD-10-CM | POA: Diagnosis not present

## 2021-09-21 NOTE — Addendum Note (Signed)
Addended by: Fonnie Mu on: 09/21/2021 04:31 PM   Modules accepted: Orders

## 2021-09-24 MED ORDER — ALPRAZOLAM 1 MG PO TABS
1.0000 mg | ORAL_TABLET | Freq: Two times a day (BID) | ORAL | 2 refills | Status: DC
Start: 2021-09-24 — End: 2021-10-22

## 2021-10-04 DIAGNOSIS — Z01419 Encounter for gynecological examination (general) (routine) without abnormal findings: Secondary | ICD-10-CM | POA: Diagnosis not present

## 2021-10-04 DIAGNOSIS — Z6831 Body mass index (BMI) 31.0-31.9, adult: Secondary | ICD-10-CM | POA: Diagnosis not present

## 2021-10-04 DIAGNOSIS — Z124 Encounter for screening for malignant neoplasm of cervix: Secondary | ICD-10-CM | POA: Diagnosis not present

## 2021-10-04 DIAGNOSIS — R635 Abnormal weight gain: Secondary | ICD-10-CM | POA: Diagnosis not present

## 2021-10-18 ENCOUNTER — Ambulatory Visit (HOSPITAL_COMMUNITY): Payer: BC Managed Care – PPO | Admitting: Psychiatry

## 2021-10-19 ENCOUNTER — Encounter: Payer: Self-pay | Admitting: Physician Assistant

## 2021-10-22 MED ORDER — ALPRAZOLAM 1 MG PO TABS: 1.0000 mg | ORAL_TABLET | Freq: Two times a day (BID) | ORAL | 0 refills | Status: DC

## 2021-11-10 ENCOUNTER — Other Ambulatory Visit: Payer: Self-pay | Admitting: Physician Assistant

## 2021-11-10 DIAGNOSIS — G43911 Migraine, unspecified, intractable, with status migrainosus: Secondary | ICD-10-CM

## 2021-11-13 ENCOUNTER — Telehealth: Payer: Self-pay | Admitting: Neurology

## 2021-11-13 DIAGNOSIS — F331 Major depressive disorder, recurrent, moderate: Secondary | ICD-10-CM

## 2021-11-13 DIAGNOSIS — F411 Generalized anxiety disorder: Secondary | ICD-10-CM

## 2021-11-13 NOTE — Telephone Encounter (Signed)
This would ultimately be a decision that she needs to discuss further with Lesly Rubenstein since the referral was quicker requested by the patient back in December.  It does look like data since some of her medications in for refill however the last prescription for Effexor was filled by Dr. Gilmore Laroche.  Would recommend continuing to see Dr. Gilmore Laroche until she is able to discuss this with [facility].  I have placed a referral for psychiatry, not the mood treatment center to get her in contact with someone else.  ___________________________________________ Thayer Ohm, DNP, APRN, FNP-BC Primary Care and Sports Medicine [facility] [place]

## 2021-11-13 NOTE — Telephone Encounter (Signed)
Patient left a vm wanting to speak with Deanna Keller.  She has appt Thursday with Dr. Fredda Hammed. She is questioning why she needs to see him stating Lesly Rubenstein is managing medications. They recently decided to stay on Effexor. It does look like last RX was prescribed by Dr. Fredda Hammed.   She also wanted referred elsewhere if she needs to see psychiatry but not the mood treatment center.   Please advise. [phone].

## 2021-11-14 ENCOUNTER — Other Ambulatory Visit (HOSPITAL_COMMUNITY): Payer: Self-pay

## 2021-11-14 MED ORDER — VENLAFAXINE HCL ER 37.5 MG PO CP24: 75.0000 mg | ORAL_CAPSULE | Freq: Every day | ORAL | 1 refills | Status: DC

## 2021-11-14 NOTE — Telephone Encounter (Signed)
Patient made aware of recommendations from message. She is overdue for an appt with Amario Longmore. She will schedule to discuss.

## 2021-11-15 ENCOUNTER — Ambulatory Visit (HOSPITAL_COMMUNITY): Payer: BC Managed Care – PPO | Admitting: Psychiatry

## 2021-11-19 ENCOUNTER — Other Ambulatory Visit: Payer: Self-pay | Admitting: Physician Assistant

## 2021-11-19 NOTE — Telephone Encounter (Signed)
Last written 10/22/2021 #60 with no refills Last appt 04/02/2021  (Accidentally hit "sign" today but it printed, voided RX)

## 2021-11-20 ENCOUNTER — Telehealth: Payer: Self-pay | Admitting: Physician Assistant

## 2021-11-20 MED ORDER — ALPRAZOLAM 1 MG PO TABS: 1.0000 mg | ORAL_TABLET | Freq: Two times a day (BID) | ORAL | 0 refills | Status: DC

## 2021-11-20 MED ORDER — ALPRAZOLAM 1 MG PO TABS
1.0000 mg | ORAL_TABLET | Freq: Two times a day (BID) | ORAL | 0 refills | Status: DC
Start: 2021-11-20 — End: 2021-11-20

## 2021-11-20 MED ORDER — BENZONATATE 200 MG PO CAPS: 200.0000 mg | ORAL_CAPSULE | Freq: Three times a day (TID) | ORAL | 0 refills | Status: DC | PRN

## 2021-11-20 NOTE — Telephone Encounter (Signed)
URI symptoms. If not improving needs evaluation.

## 2021-11-21 ENCOUNTER — Other Ambulatory Visit (HOSPITAL_COMMUNITY): Payer: Self-pay

## 2021-11-21 MED ORDER — VENLAFAXINE HCL ER 37.5 MG PO CP24
75.0000 mg | ORAL_CAPSULE | Freq: Every day | ORAL | 0 refills | Status: DC
Start: 2021-11-21 — End: 2021-11-28

## 2021-11-28 ENCOUNTER — Encounter: Payer: Self-pay | Admitting: Physician Assistant

## 2021-11-28 ENCOUNTER — Ambulatory Visit (INDEPENDENT_AMBULATORY_CARE_PROVIDER_SITE_OTHER): Payer: BC Managed Care – PPO | Admitting: Physician Assistant

## 2021-11-28 ENCOUNTER — Telehealth (HOSPITAL_COMMUNITY): Payer: Self-pay

## 2021-11-28 VITALS — BP 116/74 | HR 95 | Ht 65.0 in | Wt 215.0 lb

## 2021-11-28 DIAGNOSIS — F411 Generalized anxiety disorder: Secondary | ICD-10-CM

## 2021-11-28 DIAGNOSIS — J209 Acute bronchitis, unspecified: Secondary | ICD-10-CM | POA: Diagnosis not present

## 2021-11-28 DIAGNOSIS — E6609 Other obesity due to excess calories: Secondary | ICD-10-CM

## 2021-11-28 DIAGNOSIS — F331 Major depressive disorder, recurrent, moderate: Secondary | ICD-10-CM

## 2021-11-28 DIAGNOSIS — Z760 Encounter for issue of repeat prescription: Secondary | ICD-10-CM

## 2021-11-28 DIAGNOSIS — G4700 Insomnia, unspecified: Secondary | ICD-10-CM | POA: Diagnosis not present

## 2021-11-28 DIAGNOSIS — R635 Abnormal weight gain: Secondary | ICD-10-CM

## 2021-11-28 DIAGNOSIS — R569 Unspecified convulsions: Secondary | ICD-10-CM

## 2021-11-28 DIAGNOSIS — Z6835 Body mass index (BMI) 35.0-35.9, adult: Secondary | ICD-10-CM

## 2021-11-28 MED ORDER — AZITHROMYCIN 250 MG PO TABS: ORAL_TABLET | ORAL | 0 refills | Status: DC

## 2021-11-28 MED ORDER — VENLAFAXINE HCL ER 37.5 MG PO CP24: 75.0000 mg | ORAL_CAPSULE | Freq: Every day | ORAL | 1 refills | Status: DC

## 2021-11-28 MED ORDER — VENLAFAXINE HCL ER 150 MG PO CP24: 150.0000 mg | ORAL_CAPSULE | Freq: Every day | ORAL | 1 refills | Status: DC

## 2021-11-28 MED ORDER — ALPRAZOLAM 1 MG PO TABS: 1.0000 mg | ORAL_TABLET | Freq: Two times a day (BID) | ORAL | 5 refills | Status: DC

## 2021-11-28 MED ORDER — VENLAFAXINE HCL ER 75 MG PO CP24: 75.0000 mg | ORAL_CAPSULE | Freq: Every day | ORAL | 0 refills | Status: DC

## 2021-11-28 MED ORDER — METHYLPREDNISOLONE SODIUM SUCC 125 MG IJ SOLR
125.0000 mg | Freq: Once | INTRAMUSCULAR | Status: AC
  Administered 2021-11-28: 125 mg via INTRAMUSCULAR

## 2021-11-28 NOTE — Telephone Encounter (Signed)
Medication problem - Fax from patient's [facility] requesting a change in order of the Venlafaxine 37.5 mg, 2 daily to a 75 mg, one a day per what insurance will cover.  Patient to then take this plus the 150 mg dosage for total of 225 mg daily but will need you to send a new order for the 75 mg dosage.  Patient last seen 08/08/21, with no current appointment scheduled and has canceled 3 times since last evaluation.

## 2021-11-28 NOTE — Patient Instructions (Addendum)
Referral to [facility].   Optivia nutrition plan

## 2021-11-28 NOTE — Progress Notes (Signed)
Established Patient Office Visit  Subjective   Patient ID: Deanna Keller, female    DOB: 10/18/1980  Age: 41 y.o. MRN: [id number]  Chief Complaint  Patient presents with   Follow-up    HPI Pt is a 41 yo obese female with Seizures, MDD, GAD, Insomnia who presents to the clinic to get medication refilled.   She has had no recent seizures. She is working with her neurologist and trying to get her off gabapentin. She is hoping this will help with some of her weight gain. She went to see bariatric medicine and was given topamax. Insurance will not pay for saxenda or wegovy.   She is in the middle of a divorce. Her anxiety and depression is elevated. She is not in counseling and did not want to go back downstairs because she felt like he did not know what to do.   She is having cough/congestion/mucus production in throat. She is coughing up yellow stuff for the last 5 days. No fever, chills, SOB. No sinus pressure, ST, ear pain. Taking mucinex and tessalon pearls without benefit.  .. Active Ambulatory Problems    Diagnosis Date Noted   History of alcoholism (HCC) 07/21/2013   B12 deficiency 07/21/2013   Iron deficiency 07/21/2013   Depression 11/10/2013   Elevated LFTs 11/10/2013   Chronic migraine 11/10/2013   Malabsorption 11/10/2013   S/P gastric bypass 11/11/2013   Vitamin D deficiency 11/11/2013   Recovering alcoholic in remission (HCC) 06/16/2014   Lumbar degenerative disc disease 06/20/2014   Bariatric surgery status in pregnancy 02/10/2013   Phlebectasia 10/21/2011   Hypoglycemia following gastrointestinal surgery 04/12/2013   Generalized seizure (HCC) 08/30/2014   IUD check up 11/24/2014   Postoperative blind loop syndrome 04/12/2013   Generalized anxiety disorder 09/06/2015   Stress reaction 06/16/2017   Class 2 obesity due to excess calories without serious comorbidity with body mass index (BMI) of 35.0 to 35.9 in adult 10/03/2017   Weight gain 10/03/2017   No energy  02/05/2018   Thyromegaly 02/05/2018   Upper back pain 02/05/2018   Hx of thyroid nodule 02/08/2018   Frequent infections 06/16/2018   Arthralgia 06/16/2018   Tachycardia 12/28/2018   Anxiety about health 12/28/2018   Insomnia 12/28/2018   Seizure (HCC) 12/28/2018   Chronic rhinitis 04/03/2020   Moderate episode of recurrent major depressive disorder (HCC) 09/12/2020   Post-seizure headache 09/12/2020   Constipation 03/05/2021   Bright red blood per rectum 03/06/2021   Acute buttock pain 03/06/2021   Fall 03/06/2021   Resolved Ambulatory Problems    Diagnosis Date Noted   Lumbar back pain with radiculopathy affecting left lower extremity 10/11/2014   Viral URI 12/20/2014   Benzodiazepine misuse (HCC) 09/06/2015   Acute bronchitis 12/26/2015   Traumatic coccydynia 03/05/2021   Past Medical History:  Diagnosis Date   Alcohol abuse    Anxiety    Contusion    Seizure disorder (HCC) 12/28/2018    ROS See HPI.    Objective:     BP 116/74   Pulse 95   Ht 5\' 5"  (1.651 m)   Wt 215 lb (97.5 kg)   SpO2 100%   BMI 35.78 kg/m  BP Readings from Last 3 Encounters:  11/28/21 116/74  08/08/21 108/76  04/02/21 118/72   Wt Readings from Last 3 Encounters:  11/28/21 215 lb (97.5 kg)  08/08/21 209 lb 12.8 oz (95.2 kg)  03/05/21 190 lb (86.2 kg)    ..    11/28/2021  10:51 AM 06/21/2021    2:17 PM 01/08/2021    1:37 PM 04/03/2020    9:56 AM 09/17/2019    9:43 AM  Depression screen PHQ 2/9  Decreased Interest 1  1 1 2   Down, Depressed, Hopeless 1  1 1 2   PHQ - 2 Score 2  2 2 4   Altered sleeping 2  2 0 0  Tired, decreased energy 3  2 2 2   Change in appetite 3  2 0 0  Feeling bad or failure about yourself  3  0 2 0  Trouble concentrating 1  0 1 0  Moving slowly or fidgety/restless 0  0 0 0  Suicidal thoughts 0  0 0 0  PHQ-9 Score 14  8 7 6   Difficult doing work/chores Very difficult  Somewhat difficult  Very difficult     Information is confidential and restricted. Go  to Review Flowsheets to unlock data.   ..    11/28/2021   10:52 AM 01/08/2021    1:37 PM 04/03/2020    9:57 AM 09/17/2019    9:44 AM  GAD 7 : Generalized Anxiety Score  Nervous, Anxious, on Edge 2 3 3 3   Control/stop worrying 2 2 3 2   Worry too much - different things 2 2 3 2   Trouble relaxing 2 3 2 2   Restless 3 3 0 0  Easily annoyed or irritable 3 3 2 1   Afraid - awful might happen 0 0 0 0  Total GAD 7 Score 14 16 13 10   Anxiety Difficulty Very difficult Somewhat difficult  Very difficult      Physical Exam Constitutional:      Appearance: Normal appearance. She is obese.  HENT:     Head: Normocephalic.  Cardiovascular:     Rate and Rhythm: Normal rate and regular rhythm.     Pulses: Normal pulses.     Heart sounds: Normal heart sounds.  Pulmonary:     Effort: Pulmonary effort is normal.     Breath sounds: Normal breath sounds.  Musculoskeletal:     Right lower leg: No edema.     Left lower leg: No edema.  Neurological:     General: No focal deficit present.     Mental Status: She is alert and oriented to person, place, and time.  Psychiatric:        Mood and Affect: Mood normal.         Assessment & Plan:  ...Deanna Keller was seen today for follow-up.  Diagnoses and all orders for this visit:  Generalized anxiety disorder -     venlafaxine XR (EFFEXOR XR) 150 MG 24 hr capsule; Take 1 capsule (150 mg total) by mouth daily with breakfast. -     venlafaxine XR (EFFEXOR XR) 37.5 MG 24 hr capsule; Take 2 capsules (75 mg total) by mouth daily with breakfast. -     ALPRAZolam (XANAX) 1 MG tablet; Take 1 tablet (1 mg total) by mouth 2 (two) times daily. -     Ambulatory referral to Psychiatry  Abnormal weight gain -     Estradiol -     CP Testosterone, BIO-Female/Children -     Progesterone -     COMPLETE METABOLIC PANEL WITH GFR -     TSH  Acute bronchitis, unspecified organism -     azithromycin (ZITHROMAX) 250 MG tablet; 2 tabs po on Day 1, then 1 tab daily Days  2 - 5 -     methylPREDNISolone sodium  succinate (SOLU-MEDROL) 125 mg/2 mL injection 125 mg  Class 2 obesity due to excess calories without serious comorbidity with body mass index (BMI) of 35.0 to 35.9 in adult  Moderate episode of recurrent major depressive disorder (HCC) -     venlafaxine XR (EFFEXOR XR) 150 MG 24 hr capsule; Take 1 capsule (150 mg total) by mouth daily with breakfast. -     venlafaxine XR (EFFEXOR XR) 37.5 MG 24 hr capsule; Take 2 capsules (75 mg total) by mouth daily with breakfast. -     Ambulatory referral to Psychiatry  Insomnia, unspecified type -     Ambulatory referral to Psychiatry  Seizure (HCC) -     ALPRAZolam (XANAX) 1 MG tablet; Take 1 tablet (1 mg total) by mouth 2 (two) times daily.   Her anxiety and depression ar not controlled and concerned about medications with history of seizures At one point she was coming off xanax slowly but she does not know what happened to that Referral made to [facility] in [place].   Medications refilled today.   [name].Discussed low carb diet with 1500 calories and 80g of protein.  Exercising at least 150 minutes a week.  [employer] could be a Chief Technology Officer.  Will get hormones checked. Not aware of any other medications she would be a candidate for that helps with weight loss On topamax Consider optivia diet/plan  Solumedrol 125mg  given in office today. Start zpak if not improving in next 2-3 days.  Continue mucinex. Follow up in 6 months.    , PA-C

## 2021-11-29 ENCOUNTER — Encounter: Payer: Self-pay | Admitting: Physician Assistant

## 2021-11-29 ENCOUNTER — Ambulatory Visit (HOSPITAL_COMMUNITY): Payer: BC Managed Care – PPO | Admitting: Psychiatry

## 2021-11-30 ENCOUNTER — Encounter: Payer: Self-pay | Admitting: Physician Assistant

## 2021-11-30 NOTE — Progress Notes (Signed)
[name],   Kidney and glucose look good.  Liver enzymes improving.  Estrogen looks good. Progesterone looks low but if correlating to estradiol level then normal for follicular phase.  Thyroid looks great.

## 2021-12-03 MED ORDER — CEFDINIR 300 MG PO CAPS: 300.0000 mg | ORAL_CAPSULE | Freq: Two times a day (BID) | ORAL | 0 refills | Status: DC

## 2021-12-04 LAB — CP TESTOSTERONE, BIO-FEMALE/CHILDREN
Albumin: 4.5 g/dL (ref 3.6–5.1)
Sex Hormone Binding: 132.1 nmol/L — ABNORMAL HIGH (ref 17–124)
TESTOSTERONE, BIOAVAILABLE: 2.8 ng/dL (ref 0.5–8.5)
Testosterone, Free: 1.4 pg/mL (ref 0.2–5.0)
Testosterone, Total, LC-MS-MS: 39 ng/dL (ref 2–45)

## 2021-12-04 LAB — COMPLETE METABOLIC PANEL WITH GFR
AG Ratio: 1.6 (calc) (ref 1.0–2.5)
ALT: 31 U/L — ABNORMAL HIGH (ref 6–29)
AST: 29 U/L (ref 10–30)
Albumin: 4.1 g/dL (ref 3.6–5.1)
Alkaline phosphatase (APISO): 92 U/L (ref 31–125)
BUN: 11 mg/dL (ref 7–25)
CO2: 23 mmol/L (ref 20–32)
Calcium: 8.9 mg/dL (ref 8.6–10.2)
Chloride: 107 mmol/L (ref 98–110)
Creat: 0.82 mg/dL (ref 0.50–0.97)
Globulin: 2.6 g/dL (calc) (ref 1.9–3.7)
Glucose, Bld: 96 mg/dL (ref 65–139)
Potassium: 5 mmol/L (ref 3.5–5.3)
Sodium: 138 mmol/L (ref 135–146)
Total Bilirubin: 0.2 mg/dL (ref 0.2–1.2)
Total Protein: 6.7 g/dL (ref 6.1–8.1)
eGFR: 93 mL/min/{1.73_m2} (ref >=60)

## 2021-12-04 LAB — TSH: TSH: 1.11 mIU/L

## 2021-12-04 LAB — PROGESTERONE: Progesterone: <0.5 ng/mL

## 2021-12-04 LAB — ESTRADIOL: Estradiol: 155 pg/mL

## 2021-12-13 ENCOUNTER — Encounter: Payer: Self-pay | Admitting: Physician Assistant

## 2021-12-13 DIAGNOSIS — F331 Major depressive disorder, recurrent, moderate: Secondary | ICD-10-CM

## 2021-12-13 DIAGNOSIS — F411 Generalized anxiety disorder: Secondary | ICD-10-CM

## 2021-12-21 ENCOUNTER — Other Ambulatory Visit (HOSPITAL_COMMUNITY): Payer: Self-pay | Admitting: Psychiatry

## 2021-12-21 DIAGNOSIS — F331 Major depressive disorder, recurrent, moderate: Secondary | ICD-10-CM

## 2021-12-21 DIAGNOSIS — F411 Generalized anxiety disorder: Secondary | ICD-10-CM

## 2021-12-23 ENCOUNTER — Other Ambulatory Visit (HOSPITAL_COMMUNITY): Payer: Self-pay | Admitting: Psychiatry

## 2021-12-23 DIAGNOSIS — F411 Generalized anxiety disorder: Secondary | ICD-10-CM

## 2021-12-23 DIAGNOSIS — F331 Major depressive disorder, recurrent, moderate: Secondary | ICD-10-CM

## 2021-12-25 ENCOUNTER — Other Ambulatory Visit (HOSPITAL_COMMUNITY): Payer: Self-pay | Admitting: Psychiatry

## 2021-12-25 DIAGNOSIS — F331 Major depressive disorder, recurrent, moderate: Secondary | ICD-10-CM

## 2021-12-25 DIAGNOSIS — F411 Generalized anxiety disorder: Secondary | ICD-10-CM

## 2021-12-26 ENCOUNTER — Encounter: Payer: Self-pay | Admitting: Physician Assistant

## 2022-01-01 MED ORDER — PROGESTERONE MICRONIZED 100 MG PO CAPS: 100.0000 mg | ORAL_CAPSULE | Freq: Every day | ORAL | 1 refills | Status: DC

## 2022-01-11 MED ORDER — VENLAFAXINE HCL ER 75 MG PO CP24: 75.0000 mg | ORAL_CAPSULE | Freq: Every day | ORAL | 1 refills | Status: DC

## 2022-01-11 MED ORDER — VENLAFAXINE HCL ER 150 MG PO CP24: 150.0000 mg | ORAL_CAPSULE | Freq: Every day | ORAL | 1 refills | Status: DC

## 2022-01-11 NOTE — Addendum Note (Signed)
Addended by: Donella Stade on: 01/11/2022 01:23 PM   Modules accepted: Orders

## 2022-01-22 ENCOUNTER — Encounter: Payer: Self-pay | Admitting: Physician Assistant

## 2022-01-23 ENCOUNTER — Encounter: Payer: Self-pay | Admitting: Family Medicine

## 2022-01-23 ENCOUNTER — Ambulatory Visit (INDEPENDENT_AMBULATORY_CARE_PROVIDER_SITE_OTHER): Payer: BC Managed Care – PPO | Admitting: Family Medicine

## 2022-01-23 VITALS — BP 95/56 | HR 108 | Temp 98.6°F | Ht 65.0 in | Wt 211.0 lb

## 2022-01-23 DIAGNOSIS — J029 Acute pharyngitis, unspecified: Secondary | ICD-10-CM | POA: Diagnosis not present

## 2022-01-23 LAB — POCT RAPID STREP A (OFFICE): Rapid Strep A Screen: NEGATIVE

## 2022-01-23 LAB — POC COVID19 BINAXNOW: SARS Coronavirus 2 Ag: NEGATIVE

## 2022-01-23 NOTE — Progress Notes (Signed)
Acute Office Visit  Subjective:     Patient ID: Deanna Keller, female    DOB: 1980-04-16, 41 y.o.   MRN: [id number]  Chief Complaint  Patient presents with   Sore Throat    HPI Patient is in today for sore throat that started last night.  No fevers chills or sweats.  She said the back of her throat looked extremely swollen she is pretty sure it was her tonsils it was actually painful to swallow last night.  No ear pain.  She has had a mild headache.  She has a chronically swollen right-sided lymph node on the side of her neck that usually gets pretty enlarged when she gets sick and its been much more swollen since yesterday.  No nasal congestion or cough.  Son is here today for more lower respiratory tract symptoms including cough.  ROS      Objective:    BP (!) 95/56   Pulse (!) 108   Temp 98.6 F (37 C) (Oral)   Ht 5\' 5"  (1.651 m)   Wt 211 lb (95.7 kg)   SpO2 99%   BMI 35.11 kg/m    Physical Exam Constitutional:      Appearance: She is well-developed.  HENT:     Head: Normocephalic and atraumatic.     Right Ear: Tympanic membrane, ear canal and external ear normal.     Left Ear: Tympanic membrane, ear canal and external ear normal.     Nose: Nose normal.     Mouth/Throat:     Pharynx: Posterior oropharyngeal erythema present. No oropharyngeal exudate.     Tonsils: No tonsillar exudate.  Eyes:     Conjunctiva/sclera: Conjunctivae normal.     Pupils: Pupils are equal, round, and reactive to light.  Neck:     Thyroid: No thyromegaly.     Comments: He does have an enlarged lymph node on the right side of her neck near the sternocleidomastoid that is oval-shaped and approximately 1 x 2 cm.  She also has thyromegaly. Cardiovascular:     Rate and Rhythm: Normal rate and regular rhythm.     Heart sounds: Normal heart sounds.  Pulmonary:     Effort: Pulmonary effort is normal.     Breath sounds: Normal breath sounds. No wheezing.  Musculoskeletal:     Cervical back:  Neck supple.  Lymphadenopathy:     Cervical: No cervical adenopathy.  Skin:    General: Skin is warm and dry.  Neurological:     Mental Status: She is alert and oriented to person, place, and time.     Results for orders placed or performed in visit on 01/23/22  POC COVID-19  Result Value Ref Range   SARS Coronavirus 2 Ag Negative Negative  POCT rapid strep A  Result Value Ref Range   Rapid Strep A Screen Negative Negative        Assessment & Plan:   Problem List Items Addressed This Visit   None Visit Diagnoses     Pharyngitis, unspecified etiology    -  Primary   Relevant Orders   POC COVID-19 (Completed)   POCT rapid strep A (Completed)   Sore throat          Pharyngitis, viral-negative for strep throat and COVID.  If symptoms progress or worsen then please let 03/25/22 know otherwise recommend salt water gargles, over-the-counter pain relievers and anti-inflammatories.  Call if not better in 1 week.  No orders of the defined types were  placed in this encounter.   No follow-ups on file.  Beatrice Lecher, MD

## 2022-01-25 ENCOUNTER — Encounter: Payer: Self-pay | Admitting: Family Medicine

## 2022-01-25 MED ORDER — PREDNISONE 20 MG PO TABS: 40.0000 mg | ORAL_TABLET | Freq: Every day | ORAL | 0 refills | Status: DC

## 2022-01-25 NOTE — Telephone Encounter (Signed)
Meds ordered this encounter  Medications   predniSONE (DELTASONE) 20 MG tablet    Sig: Take 2 tablets (40 mg total) by mouth daily with breakfast.    Dispense:  10 tablet    Refill:  0    Ok for school note for son.

## 2022-01-28 ENCOUNTER — Telehealth: Payer: Self-pay | Admitting: Adult Health

## 2022-01-28 ENCOUNTER — Ambulatory Visit: Payer: BC Managed Care – PPO | Admitting: Adult Health

## 2022-01-28 NOTE — Telephone Encounter (Signed)
LVM and sent mychart msg informing pt of need to reschedule 10/16 appointment - NP out

## 2022-01-30 ENCOUNTER — Encounter: Payer: Self-pay | Admitting: Neurology

## 2022-01-30 ENCOUNTER — Ambulatory Visit (INDEPENDENT_AMBULATORY_CARE_PROVIDER_SITE_OTHER): Payer: BC Managed Care – PPO | Admitting: Neurology

## 2022-01-30 VITALS — BP 115/79 | HR 103 | Ht 65.0 in | Wt 208.0 lb

## 2022-01-30 DIAGNOSIS — Z5181 Encounter for therapeutic drug level monitoring: Secondary | ICD-10-CM | POA: Diagnosis not present

## 2022-01-30 DIAGNOSIS — G40909 Epilepsy, unspecified, not intractable, without status epilepticus: Secondary | ICD-10-CM | POA: Diagnosis not present

## 2022-01-30 DIAGNOSIS — F419 Anxiety disorder, unspecified: Secondary | ICD-10-CM | POA: Diagnosis not present

## 2022-01-30 MED ORDER — CITALOPRAM HYDROBROMIDE 20 MG PO TABS: 20.0000 mg | ORAL_TABLET | Freq: Every day | ORAL | 3 refills | Status: DC

## 2022-01-30 NOTE — Patient Instructions (Signed)
Continue Lamictal XR 400 mg daily Will check lamotrigine and topiramate level Will try her on Citalopram 20 mg  daily, but advised her to set up care with a new psychiatrist who is going to manage her meds. She is also on Effexor, discussed risk of developing Serotonin syndrome and advise patient to contact me if she has any new symptoms such as agitation, restlessness and fever Continue your other medications  Set up care with a psychiatrist for further management of your anxiety/depression  Follow-up in 6 months or sooner if needed

## 2022-01-30 NOTE — Progress Notes (Signed)
PATIENT: Deanna Keller DOB: [birth year]  REASON FOR VISIT: follow up HISTORY FROM: patient PRIMARY NEUROLOGIST: Dr. April Manson  Chief Complaint  Patient presents with   Follow-up    Rm 13, alone Reports no seizures      HISTORY OF PRESENT ILLNESS: Today 01/30/22: Patient presents today for follow-up, last visit was in April, at that time we discontinued her gabapentin.  She tolerated the procedure well.  She is currently on Lamotrigine 400 mg daily. Denies any seizure or seizure-like activity.  She feels she is more depressed than usual, currently she is separated and taking care of her son.  She is not wanting to do anything, does not want to go to work, stated after dropping her son to school in the morning she is just sitting on the couch all day.  She did follow-up with up with a psychiatrist who wanted to start her on Wellbutrin but due to her seizure disorder she did not want to start the Wellbutrin and has not seen the psychiatrist again for follow up.  Currently she is on Effexor and xanax.  She reported tried in the past Zoloft, Cymbalta, Lexapro and other antidepressants but she does not remember the name.   INTERVAL HISTORY 08/09/2021:  Deanna Keller is a 41 year old female with a history of seizures.  She returns today for follow-up.  She is currently on Lamictal XR 400 mg twice a day.  She denies any seizure events.  Reports that last seizure was when she decrease her dose of Lamictal.  The patient states that she is on gabapentin 600 mg 3 times a day.  She reports that she has gained weight on this medication and would like it discontinued.  She would like our office to instruct her on the weaning process as she is very fearful of having a breakthrough seizure.  She was also recently placed on Topamax 100 mg twice a day as an appetite suppressant.  Patient acknowledges when she was on Lamictal in the past she did not have any breakthrough seizures until she was instructed to decrease  her dose.  HISTORY ( copied from Dr. April Manson) Patient presents today for follow-up, last visit was on June 2022. At that time she reported 2 additional seizures after her lamotrigine was decreased from 400 mg a day to 300 mg a day.  Since then her lamotrigine has been increased back to 400 mg daily.  Since her last visit she denies any additional seizures.  She reported that her mood is better, sleep is better, she cut down on her caffeine and cut down on her work.  Currently she does not have any complaint or concern but just wanted to know the level of Lamictal and gabapentin.  Last seizure was May 2022     Initial history from Dr Jannifer Franklin:  Deanna Keller is a 41 year old right-handed white female with a history of seizures 4 years ago while taking Ultram.  She had 2 seizures at that time, she was placed on Lamictal, she claims she had an EEG study and she believes she had an MRI but she is not sure, the records support that she had a noncontrast CT scan of the head for a concussion in 2013.  The patient was placed on Lamictal, she has been on 300 mg a day of this medication since that time but she does not always take the medication properly, she misses doses frequently.  She was placed on Wellbutrin for weight loss, 8 days ago  she suffered another seizure, she was in the car with her husband who was driving and the patient had a witnessed event.  The patient started with a confusional event, she was talking nonsense and then went into a stiffening event, she did not bite her tongue or lose control of bowels or the bladder.  The event lasted about 1 minute.  The patient had no recollection of events during that time.  She has been increased on the Lamictal taking 200 mg twice daily.  She was taken off of the Wellbutrin.  She is sent to this office for an evaluation.  She denies any headaches, dizziness, vision changes, or any numbness or weakness of the face, arms, legs.  She denies any balance problems.  She  does have a prior history of alcohol abuse but she has not had anything to drink in 8 years.  She denies any illicit drug use such as cocaine or marijuana.  She does use Xanax in low-dose, taking 0.5 mg twice daily.  The patient does report chronic fatigue issues and daytime drowsiness.     Handedness: Right handed    Seizure Type: passing out, being stiff, clinched teeth, unconscious    Current frequency: Last seizure in May 2022   Any injuries from seizures: None    Seizure risk factors: None    Previous ASMs: Lamotrigine, Gabapentin    Currenty ASMs: Lamotrigine 400 mg daily and Gabapentin 100 mg TID    ASMs side effects: None    Brain Images: Nonspecific white matter hyperintensities   Previous EEGs: Normal routine EEG     OTHER MEDICAL CONDITIONS: Anxiety/Depression, Seizure, Gastric bypass surgery   REVIEW OF SYSTEMS: Out of a complete 14 system review of symptoms, the patient complains only of the following symptoms, and all other reviewed systems are negative.  ALLERGIES: Allergies  Allergen Reactions   Augmentin [Amoxicillin-Pot Clavulanate] Nausea And Vomiting   Tramadol Other (See Comments)    Possible seizure   Wellbutrin [Bupropion]     Seizure    HOME MEDICATIONS: Outpatient Medications Prior to Visit  Medication Sig Dispense Refill   ALPRAZolam (XANAX) 1 MG tablet Take 1 tablet (1 mg total) by mouth 2 (two) times daily. 60 tablet 5   azithromycin (ZITHROMAX) 250 MG tablet 2 tabs po on Day 1, then 1 tab daily Days 2 - 5 6 tablet 0   BD INSULIN SYRINGE U/F 31G X 5/16" 1 ML MISC USE ONCE WITH VITAMIN B12 INJECTIONS 100 each 12   cefdinir (OMNICEF) 300 MG capsule Take 1 capsule (300 mg total) by mouth 2 (two) times daily. For 7 days. 14 capsule 0   Cyanocobalamin 1000 MCG/ML KIT Inject 1 mL every two weeks. 20 kit 2   cyclobenzaprine (FLEXERIL) 10 MG tablet Take 1 tablet (10 mg total) by mouth 3 (three) times daily as needed for muscle spasms. Appt for refills  90 tablet 0   Insulin Pen Needle 31G X 6 MM MISC Use once with vitamin B12 injections 100 each 0   LamoTRIgine (LAMICTAL XR) 200 MG TB24 24 hour tablet Take 2 tablets (400 mg total) by mouth daily. 180 tablet 1   predniSONE (DELTASONE) 20 MG tablet Take 2 tablets (40 mg total) by mouth daily with breakfast. 10 tablet 0   venlafaxine XR (EFFEXOR XR) 150 MG 24 hr capsule Take 1 capsule (150 mg total) by mouth daily with breakfast. 90 capsule 1   venlafaxine XR (EFFEXOR XR) 75 MG 24 hr capsule Take  1 capsule (75 mg total) by mouth daily with breakfast. 90 capsule 1   gabapentin (NEURONTIN) 300 MG capsule Take 300 mg by mouth at bedtime.     progesterone (PROMETRIUM) 100 MG capsule Take 1 capsule (100 mg total) by mouth at bedtime. 90 capsule 1   topiramate (TOPAMAX) 200 MG tablet Take 200 mg by mouth 2 (two) times daily.     No facility-administered medications prior to visit.    PAST MEDICAL HISTORY: Past Medical History:  Diagnosis Date   Alcohol abuse    sober since 2011   Anxiety    Contusion    tail bone contusion in Nov 2022   Seizure disorder ([place]) 12/28/2018    PAST SURGICAL HISTORY: Past Surgical History:  Procedure Laterality Date   CERVICAL CONE BIOPSY     GASTRIC BYPASS  2005    FAMILY HISTORY: Family History  Problem Relation Age of Onset   Colon cancer Mother    Depression Mother    Hypertension Father     SOCIAL HISTORY: Social History   Socioeconomic History   Marital status: Legally Separated    Spouse name: Not on file   Number of children: Not on file   Years of education: Not on file   Highest education level: Not on file  Occupational History   Not on file  Tobacco Use   Smoking status: Former    Packs/day: 0.50    Years: 10.00    Total pack years: 5.00    Types: E-cigarettes, Cigarettes    Start date: 07/21/2012   Smokeless tobacco: Current   Tobacco comments:    uses nicotine pouches sometimes   Vaping Use   Vaping Use: Never used   Substance and Sexual Activity   Alcohol use: No   Drug use: No   Sexual activity: Yes    Partners: Male  Other Topics Concern   Not on file  Social History Narrative   Right handed   Caffeine maybe 1 cup/day   Lives at home with husband and 61 year old son    Social Determinants of Health   Financial Resource Strain: Not on file  Food Insecurity: Not on file  Transportation Needs: Not on file  Physical Activity: Not on file  Stress: Not on file  Social Connections: Not on file  Intimate Partner Violence: Not on file      PHYSICAL EXAM  Vitals:   01/30/22 1125  BP: 115/79  Pulse: (!) 103  Weight: 208 lb (94.3 kg)  Height: 5' 5"  (1.651 m)    Body mass index is 34.61 kg/m.  Generalized: Well developed, in no acute distress but appears anxious and tremulous.   Neurological examination  Mentation: Alert oriented to time, place, history taking. Follows all commands speech and language fluent Cranial nerve II-XII: Pupils were equal round reactive to light. Extraocular movements were full, visual field were full on confrontational test. Facial sensation and strength were normal. Head turning and shoulder shrug  were normal and symmetric. Motor: The motor testing reveals 5 over 5 strength of all 4 extremities. Good symmetric motor tone is noted throughout.  Sensory: Sensory testing is intact to soft touch on all 4 extremities. No evidence of extinction is noted.  Coordination: Cerebellar testing reveals good finger-nose-finger and heel-to-shin bilaterally.  Gait and station: Gait is normal.     DIAGNOSTIC DATA (LABS, IMAGING, TESTING) - I reviewed patient records, labs, notes, testing and imaging myself where available.  Lab Results  Component  Value Date   WBC 4.2 08/08/2021   HGB 12.2 08/08/2021   HCT 37.3 08/08/2021   MCV 87 08/08/2021   PLT 364 08/08/2021      Component Value Date/Time   NA 138 11/28/2021 0000   NA 140 08/08/2021 1106   K 5.0 11/28/2021 0000    CL 107 11/28/2021 0000   CO2 23 11/28/2021 0000   GLUCOSE 96 11/28/2021 0000   BUN 11 11/28/2021 0000   BUN 14 08/08/2021 1106   CREATININE 0.82 11/28/2021 0000   CALCIUM 8.9 11/28/2021 0000   CALCIUM 9.0 04/19/2013 0000   PROT 6.7 11/28/2021 0000   PROT 7.1 08/08/2021 1106   ALBUMIN 4.4 08/08/2021 1106   ALBUMIN 3.8 04/19/2013 0000   AST 29 11/28/2021 0000   ALT 31 (H) 11/28/2021 0000   ALKPHOS 83 08/08/2021 1106   BILITOT 0.2 11/28/2021 0000   BILITOT 0.3 08/08/2021 1106   GFRNONAA 109 04/05/2020 0711   GFRAA 126 04/05/2020 0711   Lab Results  Component Value Date   CHOL 205 (H) 02/20/2021   HDL 92 02/20/2021   LDLCALC 100 (H) 02/20/2021   TRIG 45 02/20/2021   CHOLHDL 2.2 02/20/2021   Lab Results  Component Value Date   HGBA1C 4.9 02/20/2021   Lab Results  Component Value Date   VITAMINB12 962 02/20/2021   Lab Results  Component Value Date   TSH 1.11 11/28/2021    ASSESSMENT AND PLAN 41 y.o. year old female  has a past medical history of Alcohol abuse, Anxiety, Contusion, and Seizure disorder ([place]) (12/28/2018). here with:  1.  Seizures  Continue Lamictal XR 400 mg daily Will check lamotrigine and topiramate level Will try her on Citalopram 20 mg  daily, but advised her to set up care with a new psychiatrist who is going to manage her meds. She is also on Effexor, discussed risk of developing Serotonin syndrome and advise patient to contact me if she has any new symptoms such as agitation, restlessness and fever Continue your other medications  Set up care with a psychiatrist for further management of your anxiety/depression  Follow-up in 6 months or sooner if needed    Alric Ran, MD 01/30/2022, 2:59 PM [facility] [address] [place], [place] [address] [phone]

## 2022-02-01 LAB — LAMOTRIGINE LEVEL: Lamotrigine Lvl: 8 ug/mL (ref 2.0–20.0)

## 2022-02-01 LAB — TOPIRAMATE LEVEL: Topiramate Lvl: 4.8 ug/mL (ref 2.0–25.0)

## 2022-02-07 ENCOUNTER — Other Ambulatory Visit: Payer: Self-pay | Admitting: Physician Assistant

## 2022-02-07 ENCOUNTER — Ambulatory Visit: Payer: BC Managed Care – PPO | Admitting: Adult Health

## 2022-02-07 DIAGNOSIS — G43911 Migraine, unspecified, intractable, with status migrainosus: Secondary | ICD-10-CM

## 2022-02-22 ENCOUNTER — Other Ambulatory Visit: Payer: Self-pay | Admitting: Neurology

## 2022-03-04 ENCOUNTER — Other Ambulatory Visit: Payer: Self-pay | Admitting: Physician Assistant

## 2022-03-04 ENCOUNTER — Encounter: Payer: Self-pay | Admitting: Family Medicine

## 2022-03-04 ENCOUNTER — Encounter: Payer: Self-pay | Admitting: Physician Assistant

## 2022-03-04 NOTE — Telephone Encounter (Signed)
This was last written in January 2023 for 6 months Please advise on refill

## 2022-03-06 ENCOUNTER — Encounter: Payer: Self-pay | Admitting: Neurology

## 2022-03-21 DIAGNOSIS — K912 Postsurgical malabsorption, not elsewhere classified: Secondary | ICD-10-CM | POA: Diagnosis not present

## 2022-03-21 DIAGNOSIS — E669 Obesity, unspecified: Secondary | ICD-10-CM | POA: Diagnosis not present

## 2022-03-21 DIAGNOSIS — Z9884 Bariatric surgery status: Secondary | ICD-10-CM | POA: Diagnosis not present

## 2022-03-21 DIAGNOSIS — Z6832 Body mass index (BMI) 32.0-32.9, adult: Secondary | ICD-10-CM | POA: Diagnosis not present

## 2022-05-01 ENCOUNTER — Other Ambulatory Visit: Payer: Self-pay | Admitting: Physician Assistant

## 2022-05-01 DIAGNOSIS — G43911 Migraine, unspecified, intractable, with status migrainosus: Secondary | ICD-10-CM

## 2022-05-29 ENCOUNTER — Encounter: Payer: Self-pay | Admitting: Physician Assistant

## 2022-05-29 ENCOUNTER — Other Ambulatory Visit: Payer: Self-pay | Admitting: Physician Assistant

## 2022-05-29 DIAGNOSIS — R569 Unspecified convulsions: Secondary | ICD-10-CM

## 2022-05-29 DIAGNOSIS — F411 Generalized anxiety disorder: Secondary | ICD-10-CM

## 2022-05-29 DIAGNOSIS — G43911 Migraine, unspecified, intractable, with status migrainosus: Secondary | ICD-10-CM

## 2022-05-29 NOTE — Telephone Encounter (Signed)
Called spoke with patient she is scheduled for 06-05-22 @ 9:10 am for med refills

## 2022-05-29 NOTE — Telephone Encounter (Signed)
Patient needs appt, please call

## 2022-05-30 ENCOUNTER — Encounter: Payer: Self-pay | Admitting: Neurology

## 2022-06-05 ENCOUNTER — Encounter: Payer: Self-pay | Admitting: Neurology

## 2022-06-05 ENCOUNTER — Encounter: Payer: Self-pay | Admitting: Physician Assistant

## 2022-06-05 ENCOUNTER — Ambulatory Visit (INDEPENDENT_AMBULATORY_CARE_PROVIDER_SITE_OTHER): Payer: BC Managed Care – PPO | Admitting: Physician Assistant

## 2022-06-05 VITALS — BP 135/80 | HR 96 | Ht 65.0 in

## 2022-06-05 DIAGNOSIS — R569 Unspecified convulsions: Secondary | ICD-10-CM

## 2022-06-05 DIAGNOSIS — G4700 Insomnia, unspecified: Secondary | ICD-10-CM

## 2022-06-05 DIAGNOSIS — F411 Generalized anxiety disorder: Secondary | ICD-10-CM | POA: Diagnosis not present

## 2022-06-05 DIAGNOSIS — F331 Major depressive disorder, recurrent, moderate: Secondary | ICD-10-CM

## 2022-06-05 MED ORDER — ESZOPICLONE 1 MG PO TABS: 1.0000 mg | ORAL_TABLET | Freq: Every evening | ORAL | 1 refills | Status: DC | PRN

## 2022-06-05 MED ORDER — FLUOXETINE HCL 20 MG PO TABS: 20.0000 mg | ORAL_TABLET | Freq: Every day | ORAL | 1 refills | Status: DC

## 2022-06-05 NOTE — Progress Notes (Signed)
Established Patient Office Visit  Subjective   Patient ID: Deanna Keller, female    DOB: Apr 04, 1981  Age: 42 y.o. MRN: [id number]  Chief Complaint  Patient presents with   Medication Refill    HPI Patient is a 42 year old female with a history of anxiety and depression who is currently being treated with Effexor and Celexa and Lamictal.  She has not done well for the last 3 months.  She denies making any harmful plans to hurt herself or others but she has had absolutely no motivation to do anything.  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  She has no desire to clean her house.  She has no desire to shower some days.  She has no desire to get out of bed.  Nothing brings her pleasure.  She has talked to her neurologist about medications for depression and anxiety because she is concerned with her seizure risk.  They suggested she try anything on the SSRI class to add to effexor. She has not be going to talk therapy but she has been meeting up with her sponsor. She has continue to stay sober from alcohol.  She has not done well with sleeping either.  She has has trouble going to sleep and staying asleep.  She is only tried Ambien and did not like the way she felt.   .. Active Ambulatory Problems    Diagnosis Date Noted   History of alcoholism ([place]) 07/21/2013   B12 deficiency 07/21/2013   Iron deficiency 07/21/2013   Depression 11/10/2013   Elevated LFTs 11/10/2013   Chronic migraine 11/10/2013   Malabsorption 11/10/2013   S/P gastric bypass 11/11/2013   Vitamin D deficiency 11/11/2013   Recovering alcoholic in remission ([place]) 06/16/2014   Lumbar degenerative disc disease 06/20/2014   Bariatric surgery status in pregnancy 02/10/2013   Phlebectasia 10/21/2011   Hypoglycemia following gastrointestinal surgery 04/12/2013   Generalized seizure ([place]) 08/30/2014   IUD check up 11/24/2014   Postoperative blind loop syndrome 04/12/2013   Generalized  anxiety disorder 09/06/2015   Stress reaction 06/16/2017   Class 2 obesity due to excess calories without serious comorbidity with body mass index (BMI) of 35.0 to 35.9 in adult 10/03/2017   Weight gain 10/03/2017   No energy 02/05/2018   Thyromegaly 02/05/2018   Upper back pain 02/05/2018   Hx of thyroid nodule 02/08/2018   Frequent infections 06/16/2018   Arthralgia 06/16/2018   Tachycardia 12/28/2018   Anxiety about health 12/28/2018   Insomnia 12/28/2018   Seizure ([place]) 12/28/2018   Chronic rhinitis 04/03/2020   Moderate episode of recurrent major depressive disorder ([place]) 09/12/2020   Post-seizure headache 09/12/2020   Constipation 03/05/2021   Bright red blood per rectum 03/06/2021   Acute buttock pain 03/06/2021   Fall 03/06/2021   Resolved Ambulatory Problems    Diagnosis Date Noted   Lumbar back pain with radiculopathy affecting left lower extremity 10/11/2014   Viral URI 12/20/2014   Benzodiazepine misuse ([place]) 09/06/2015   Acute bronchitis 12/26/2015   Traumatic coccydynia 03/05/2021   Past Medical History:  Diagnosis Date   Alcohol abuse    Anxiety    Contusion    Seizure disorder ([place]) 12/28/2018    ROS See HPI.    Objective:     BP 135/80 (BP Location: Left Arm, Patient Position: Sitting, Cuff Size: Large)   Pulse 96   Ht 5' 5"$  (1.651 m)   SpO2 100%  BMI 34.61 kg/m  BP Readings from Last 3 Encounters:  06/05/22 135/80  01/30/22 115/79  01/23/22 (!) 95/56   Wt Readings from Last 3 Encounters:  01/30/22 208 lb (94.3 kg)  01/23/22 211 lb (95.7 kg)  11/28/21 215 lb (97.5 kg)   ..    06/05/2022    9:38 AM 11/28/2021   10:51 AM 06/21/2021    2:17 PM 01/08/2021    1:37 PM 04/03/2020    9:56 AM  Depression screen PHQ 2/9  Decreased Interest 3 1  1 1  $ Down, Depressed, Hopeless 3 1  1 1  $ PHQ - 2 Score 6 2  2 2  $ Altered sleeping 3 2  2 $ 0  Tired, decreased energy 3 3  2 2  $ Change in appetite 0 3  2 0  Feeling bad or failure about yourself  2 3   0 2  Trouble concentrating 3 1  0 1  Moving slowly or fidgety/restless 0 0  0 0  Suicidal thoughts 0 0  0 0  PHQ-9 Score 17 14  8 7  $ Difficult doing work/chores Extremely dIfficult Very difficult  Somewhat difficult      Information is confidential and restricted. Go to Review Flowsheets to unlock data.   ..    06/05/2022    9:39 AM 11/28/2021   10:52 AM 01/08/2021    1:37 PM 04/03/2020    9:57 AM  GAD 7 : Generalized Anxiety Score  Nervous, Anxious, on Edge 3 2 3 3  $ Control/stop worrying 3 2 2 3  $ Worry too much - different things 3 2 2 3  $ Trouble relaxing 2 2 3 2  $ Restless 1 3 3 $ 0  Easily annoyed or irritable 1 3 3 2  $ Afraid - awful might happen 0 0 0 0  Total GAD 7 Score 13 14 16 13  $ Anxiety Difficulty Extremely difficult Very difficult Somewhat difficult         Physical Exam Constitutional:      Appearance: Normal appearance. She is obese.  Cardiovascular:     Rate and Rhythm: Normal rate.  Pulmonary:     Effort: Pulmonary effort is normal.  Musculoskeletal:     Right lower leg: No edema.     Left lower leg: No edema.  Neurological:     General: No focal deficit present.     Mental Status: She is alert.  Psychiatric:     Comments: Flat affect today        Assessment & Plan:  Marland [name] KitchenUldene was seen today for medication refill.  Diagnoses and all orders for this visit:  Moderate episode of recurrent major depressive disorder (HCC) -     FLUoxetine (PROZAC) 20 MG tablet; Take 1 tablet (20 mg total) by mouth daily.  Seizure ([place])  Insomnia, unspecified type -     eszopiclone (LUNESTA) 1 MG TABS tablet; Take 1 tablet (1 mg total) by mouth at bedtime as needed for sleep. Take immediately before bedtime  Generalized anxiety disorder -     FLUoxetine (PROZAC) 20 MG tablet; Take 1 tablet (20 mg total) by mouth daily.   PHQ/GAD not to goal Reviewed neurologist reply about medication Encouraged patient to ask neurologist about Vraylar since it does have a seizure  caution warning For now lets taper off celexa and titrate up on prozac  Continue on same dose of effexor and lamictal Discussed sleep Start lunesta Failed Lorrin Mais and other sleep medications have a seizure risk Encouraged patient to engage  in regular talk therapy     Iran Planas, PA-C

## 2022-06-05 NOTE — Patient Instructions (Addendum)
1/2 celexa with 1/2 of prozac for 7 days then stop celexa and start full table of prozac  Ask neurology about?   Cariprazine Capsules What is this medication? CARIPRAZINE (car i PRA zeen) treats schizophrenia and bipolar disorder. It may also be used with antidepressant medication to treat depression. It works by balancing the levels of dopamine and serotonin in your brain, substances that help regulate mood, behaviors, and thoughts. It belongs to a group of medications called antipsychotics. Antipsychotic medications can be used to treat several kinds of mental health conditions. This medicine may be used for other purposes; ask your health care provider or pharmacist if you have questions. COMMON BRAND NAME(S): VRAYLAR What should I tell my care team before I take this medication? They need to know if you have any of these conditions: Dementia Diabetes Difficulty swallowing Have trouble controlling your muscles Heart disease High cholesterol History of breast cancer History of stroke Kidney disease Liver disease Low blood counts, like low white cell, platelet, or red cell counts Low blood pressure Parkinson's disease Seizures Suicidal thoughts, plans or attempt; a previous suicide attempt by you or a family member An unusual or allergic reaction to cariprazine, other medications, foods, dyes, or preservatives Pregnant or trying to get pregnant Breast-feeding How should I use this medication? Take this medication by mouth with a glass of water. Follow the directions on the prescription label. You may take it with or without food. Take your medication at regular intervals. Do not take it more often than directed. Do not stop taking except on your care team's advice. A special MedGuide will be given to you by the pharmacist with each prescription and refill. Be sure to read this information carefully each time. Talk to your care team about the use of this medication in children.  Special care may be needed. Overdosage: If you think you have taken too much of this medicine contact a poison control center or emergency room at once. NOTE: This medicine is only for you. Do not share this medicine with others. What if I miss a dose? If you miss a dose, take it as soon as you can. If it is almost time for your next dose, take only that dose. Do not take double or extra doses. What may interact with this medication? Do not take this medication with any of the following: Metoclopramide This medication may also interact with the following: Antihistamines for allergy, cough, and cold Carbamazepine Certain medications for anxiety or sleep Certain medications for depression like amitriptyline, fluoxetine, sertraline Certain medications for fungal infections like itraconazole, ketoconazole General anesthetics like halothane, isoflurane, methoxyflurane, propofol Levodopa or other medications for Parkinson's disease Medications for blood pressure Medications for seizures Medications that relax muscles for surgery Narcotic medications for pain Phenothiazines like chlorpromazine, prochlorperazine, thioridazine Rifampin This list may not describe all possible interactions. Give your health care provider a list of all the medicines, herbs, non-prescription drugs, or dietary supplements you use. Also tell them if you smoke, drink alcohol, or use illegal drugs. Some items may interact with your medicine. What should I watch for while using this medication? Visit your care team for regular checks on your progress. Tell your care team if symptoms do not start to get better or if they get worse. Do not stop taking except on your care team's advice. You may develop a severe reaction. Your care team will tell you how much medication to take. Patients and their families should watch out for new or  worsening depression or thoughts of suicide. Also watch out for sudden changes in feelings such  as feeling anxious, agitated, panicky, irritable, hostile, aggressive, impulsive, severely restless, overly excited and hyperactive, or not being able to sleep. If this happens, especially at the beginning of treatment or after a change in dose, call your care team. You may get dizzy or drowsy. Do not drive, use machinery, or do anything that needs mental alertness until you know how this medication affects you. Do not stand or sit up quickly, especially if you are an older patient. This reduces the risk of dizzy or fainting spells. Alcohol may interfere with the effect of this medication. Avoid alcoholic drinks. This medication may cause dry eyes and blurred vision. If you wear contact lenses you may feel some discomfort. Lubricating drops may help. See your eye doctor if the problem does not go away or is severe. This medication may increase blood sugar. Ask your care team if changes in diet or medications are needed if you have diabetes. This medication can cause problems with controlling your body temperature. It can lower the response of your body to cold temperatures. If possible, stay indoors during cold weather. If you must go outdoors, wear warm clothes. It can also lower the response of your body to heat. Do not overheat. Do not over-exercise. Stay out of the sun when possible. If you must be in the sun, wear cool clothing. Drink plenty of water. If you have trouble controlling your body temperature, call your care team right away. Women should inform their care team if they wish to become pregnant or think they might be pregnant. The effects of this medication on an unborn child are not known. A registry is available to monitor pregnancy outcomes in pregnant women exposed to this medication or similar medications. Talk to your care team or pharmacist for more information. What side effects may I notice from receiving this medication? Side effects that you should report to your care team as soon as  possible: Allergic reactions--skin rash, itching, hives, swelling of the face, lips, tongue, or throat High blood sugar (hyperglycemia)--increased thirst or amount of urine, unusual weakness or fatigue, blurry vision High fever, stiff muscles, increased sweating, fast or irregular heartbeat, and confusion, which may be signs of neuroleptic malignant syndrome Infection--fever, chills, cough, or sore throat Low blood pressure--dizziness, feeling faint or lightheaded, blurry vision Pain or trouble swallowing Seizures Stroke--sudden numbness or weakness of the face, arm, or leg, trouble speaking, confusion, trouble walking, loss of balance or coordination, dizziness, severe headache, change in vision Thoughts of suicide or self-harm, worsening mood, feelings of depression Uncontrolled and repetitive body movements, muscle stiffness or spasms, tremors or shaking, loss of balance or coordination, restlessness, shuffling walk, which may be signs of extrapyramidal symptoms (EPS) Side effects that usually do not require medical attention (report to your care team if they continue or are bothersome): Constipation Dizziness Drowsiness Nausea Trouble sleeping Upset stomach Vomiting This list may not describe all possible side effects. Call your doctor for medical advice about side effects. You may report side effects to FDA at 1-800-FDA-1088. Where should I keep my medication? Keep out of the reach of children. Store at room temperature between 15 and 30 degrees C (59 and 86 degrees F). Protect from light. Throw away any unused medication after the expiration date. NOTE: This sheet is a summary. It may not cover all possible information. If you have questions about this medicine, talk to your doctor, pharmacist,  or health care provider.  2023 Elsevier/Gold Standard (2020-10-12 00:00:00)

## 2022-06-10 DIAGNOSIS — Z6829 Body mass index (BMI) 29.0-29.9, adult: Secondary | ICD-10-CM | POA: Diagnosis not present

## 2022-06-10 DIAGNOSIS — F332 Major depressive disorder, recurrent severe without psychotic features: Secondary | ICD-10-CM | POA: Insufficient documentation

## 2022-06-10 DIAGNOSIS — E611 Iron deficiency: Secondary | ICD-10-CM | POA: Diagnosis not present

## 2022-06-10 DIAGNOSIS — Z9884 Bariatric surgery status: Secondary | ICD-10-CM | POA: Diagnosis not present

## 2022-06-10 DIAGNOSIS — E785 Hyperlipidemia, unspecified: Secondary | ICD-10-CM | POA: Diagnosis not present

## 2022-06-10 DIAGNOSIS — K912 Postsurgical malabsorption, not elsewhere classified: Secondary | ICD-10-CM | POA: Diagnosis not present

## 2022-06-10 DIAGNOSIS — R5383 Other fatigue: Secondary | ICD-10-CM | POA: Diagnosis not present

## 2022-06-11 ENCOUNTER — Encounter: Payer: Self-pay | Admitting: Physician Assistant

## 2022-06-19 MED ORDER — RAMELTEON 8 MG PO TABS: 8.0000 mg | ORAL_TABLET | Freq: Every day | ORAL | 0 refills | Status: DC

## 2022-06-27 ENCOUNTER — Other Ambulatory Visit: Payer: Self-pay | Admitting: Physician Assistant

## 2022-06-27 DIAGNOSIS — F331 Major depressive disorder, recurrent, moderate: Secondary | ICD-10-CM

## 2022-06-27 DIAGNOSIS — F411 Generalized anxiety disorder: Secondary | ICD-10-CM

## 2022-06-29 ENCOUNTER — Other Ambulatory Visit: Payer: Self-pay | Admitting: Physician Assistant

## 2022-06-29 DIAGNOSIS — R569 Unspecified convulsions: Secondary | ICD-10-CM

## 2022-06-29 DIAGNOSIS — F411 Generalized anxiety disorder: Secondary | ICD-10-CM

## 2022-07-01 ENCOUNTER — Encounter: Payer: Self-pay | Admitting: Physician Assistant

## 2022-07-01 ENCOUNTER — Other Ambulatory Visit: Payer: Self-pay | Admitting: Physician Assistant

## 2022-07-01 DIAGNOSIS — F411 Generalized anxiety disorder: Secondary | ICD-10-CM

## 2022-07-01 DIAGNOSIS — R569 Unspecified convulsions: Secondary | ICD-10-CM

## 2022-07-01 NOTE — Telephone Encounter (Signed)
Last appt 06/05/2022  Last written 05/29/2022 for one month no refills

## 2022-07-03 NOTE — Addendum Note (Signed)
Addended by: Donella Stade on: 07/03/2022 03:57 PM   Modules accepted: Orders

## 2022-07-07 ENCOUNTER — Other Ambulatory Visit: Payer: Self-pay | Admitting: Physician Assistant

## 2022-07-07 DIAGNOSIS — F411 Generalized anxiety disorder: Secondary | ICD-10-CM

## 2022-07-07 DIAGNOSIS — F331 Major depressive disorder, recurrent, moderate: Secondary | ICD-10-CM

## 2022-07-08 MED ORDER — MIRTAZAPINE 7.5 MG PO TABS: 7.5000 mg | ORAL_TABLET | Freq: Every day | ORAL | 0 refills | Status: DC

## 2022-07-08 NOTE — Addendum Note (Signed)
Addended by: Donella Stade on: 07/08/2022 04:43 PM   Modules accepted: Orders

## 2022-07-17 ENCOUNTER — Ambulatory Visit (INDEPENDENT_AMBULATORY_CARE_PROVIDER_SITE_OTHER): Payer: BC Managed Care – PPO | Admitting: Neurology

## 2022-07-17 ENCOUNTER — Encounter: Payer: Self-pay | Admitting: Neurology

## 2022-07-17 VITALS — BP 119/74 | HR 94 | Ht 65.0 in | Wt 147.5 lb

## 2022-07-17 DIAGNOSIS — F419 Anxiety disorder, unspecified: Secondary | ICD-10-CM | POA: Diagnosis not present

## 2022-07-17 DIAGNOSIS — G40909 Epilepsy, unspecified, not intractable, without status epilepticus: Secondary | ICD-10-CM | POA: Diagnosis not present

## 2022-07-17 DIAGNOSIS — F32A Depression, unspecified: Secondary | ICD-10-CM | POA: Diagnosis not present

## 2022-07-17 NOTE — Progress Notes (Signed)
PATIENT: Deanna Keller DOB: 1980-10-20  REASON FOR VISIT: follow up HISTORY FROM: patient PRIMARY NEUROLOGIST: Dr. April Keller  Chief Complaint  Patient presents with   Follow-up    Rm 13. Pt alone. Patient alone, questions about Effexor, now takes prosac. Still has depression complaints.  Patient has done internet research and wants to know why she has the dosage she does. Patients reports no seizures. States having hand tremors since starting topamax     HISTORY OF PRESENT ILLNESS: Today 07/17/22: Deanna Keller presents today for follow-up, last visit was in October, since then, she has not had any seizures.  She is doing well on lamotrigine XR 400 mg daily.  On top of that she is also on Topamax for appetite suppression but the Topamax is giving her tremors.  She still struggling with her depression and following with psychiatry, she is on venlafaxine and fluoxetine.  Again no seizure or seizure-like activity. She is off Gabapentin   INTERVAL HISTORY 01/30/2022: Patient presents today for follow-up, last visit was in [place], at that time we discontinued her gabapentin.  She tolerated the procedure well.  She is currently on Lamotrigine 400 mg daily. Denies any seizure or seizure-like activity.  She feels she is more depressed than usual, currently she is separated and taking care of her son.  She is not wanting to do anything, does not want to go to work, stated after dropping her son to school in the morning she is just sitting on the couch all day.  She did follow-up with up with a psychiatrist who wanted to start her on Wellbutrin but due to her seizure disorder she did not want to start the Wellbutrin and has not seen the psychiatrist again for follow up.  Currently she is on Effexor and xanax.  She reported tried in the past Zoloft, Cymbalta, Lexapro and other antidepressants but she does not remember the name.   INTERVAL HISTORY 08/09/2021:  Deanna Keller is a 42 year old female with a history  of seizures.  She returns today for follow-up.  She is currently on Lamictal XR 400 mg twice a day.  She denies any seizure events.  Reports that last seizure was when she decrease her dose of Lamictal.  The patient states that she is on gabapentin 600 mg 3 times a day.  She reports that she has gained weight on this medication and would like it discontinued.  She would like our office to instruct her on the weaning process as she is very fearful of having a breakthrough seizure.  She was also recently placed on Topamax 100 mg twice a day as an appetite suppressant.  Patient acknowledges when she was on Lamictal in the past she did not have any breakthrough seizures until she was instructed to decrease her dose.  HISTORY ( copied from Dr. April Keller) Patient presents today for follow-up, last visit was on June 2022. At that time she reported 2 additional seizures after her lamotrigine was decreased from 400 mg a day to 300 mg a day.  Since then her lamotrigine has been increased back to 400 mg daily.  Since her last visit she denies any additional seizures.  She reported that her mood is better, sleep is better, she cut down on her caffeine and cut down on her work.  Currently she does not have any complaint or concern but just wanted to know the level of Lamictal and gabapentin.  Last seizure was May 2022     Initial history  from Dr Deanna Keller:  Deanna Keller is a 42 year old right-handed white female with a history of seizures 4 years ago while taking Ultram.  She had 2 seizures at that time, she was placed on Lamictal, she claims she had an EEG study and she believes she had an MRI but she is not sure, the records support that she had a noncontrast CT scan of the head for a concussion in 2013.  The patient was placed on Lamictal, she has been on 300 mg a day of this medication since that time but she does not always take the medication properly, she misses doses frequently.  She was placed on Wellbutrin for weight  loss, 8 days ago she suffered another seizure, she was in the car with her husband who was driving and the patient had a witnessed event.  The patient started with a confusional event, she was talking nonsense and then went into a stiffening event, she did not bite her tongue or lose control of bowels or the bladder.  The event lasted about 1 minute.  The patient had no recollection of events during that time.  She has been increased on the Lamictal taking 200 mg twice daily.  She was taken off of the Wellbutrin.  She is sent to this office for an evaluation.  She denies any headaches, dizziness, vision changes, or any numbness or weakness of the face, arms, legs.  She denies any balance problems.  She does have a prior history of alcohol abuse but she has not had anything to drink in 8 years.  She denies any illicit drug use such as cocaine or marijuana.  She does use Xanax in low-dose, taking 0.5 mg twice daily.  The patient does report chronic fatigue issues and daytime drowsiness.     Handedness: Right handed    Seizure Type: passing out, being stiff, clinched teeth, unconscious    Current frequency: Last seizure in May 2022   Any injuries from seizures: None    Seizure risk factors: None    Previous ASMs: Lamotrigine, Gabapentin    Currenty ASMs: Lamotrigine 400 mg daily   ASMs side effects: None    Brain Images: Nonspecific white matter hyperintensities   Previous EEGs: Normal routine EEG     OTHER MEDICAL CONDITIONS: Anxiety/Depression, Seizure, Gastric bypass surgery   REVIEW OF SYSTEMS: Out of a complete 14 system review of symptoms, the patient complains only of the following symptoms, and all other reviewed systems are negative.  ALLERGIES: Allergies  Allergen Reactions   Augmentin [Amoxicillin-Pot Clavulanate] Nausea And Vomiting   Tramadol Other (See Comments)    Possible seizure   Wellbutrin [Bupropion]     Seizure    HOME MEDICATIONS: Outpatient Medications Prior  to Visit  Medication Sig Dispense Refill   ALPRAZolam (XANAX) 1 MG tablet TAKE 1 TABLET BY MOUTH TWICE A DAY 60 tablet 5   BD INSULIN SYRINGE U/F 31G X 5/16" 1 ML MISC USE ONCE WITH VITAMIN B12 INJECTIONS 100 each 12   Cyanocobalamin 1000 MCG/ML KIT Inject 1 mL every two weeks. 20 kit 2   cyclobenzaprine (FLEXERIL) 10 MG tablet TAKE 1 TABLET BY MOUTH 3 (THREE) TIMES DAILY AS NEEDED FOR MUSCLE SPASMS. APPT FOR REFILLS 45 tablet 0   FLUoxetine (PROZAC) 20 MG tablet TAKE 1 TABLET BY MOUTH EVERY DAY 90 tablet 1   Insulin Pen Needle 31G X 6 MM MISC Use once with vitamin B12 injections 100 each 0   LamoTRIgine 200 MG TB24 24  hour tablet TAKE 2 TABLETS BY MOUTH EVERY DAY 180 tablet 1   mirtazapine (REMERON) 7.5 MG tablet Take 1 tablet (7.5 mg total) by mouth at bedtime. 30 tablet 0   topiramate (TOPAMAX) 100 MG tablet Take 100 mg by mouth 2 (two) times daily.     venlafaxine XR (EFFEXOR XR) 150 MG 24 hr capsule Take 1 capsule (150 mg total) by mouth daily with breakfast. 90 capsule 1   venlafaxine XR (EFFEXOR-XR) 75 MG 24 hr capsule TAKE 1 CAPSULE BY MOUTH DAILY WITH BREAKFAST. 90 capsule 1   No facility-administered medications prior to visit.    PAST MEDICAL HISTORY: Past Medical History:  Diagnosis Date   Alcohol abuse    sober since 2011   Anxiety    Contusion    tail bone contusion in Nov 2022   Seizure disorder 12/28/2018    PAST SURGICAL HISTORY: Past Surgical History:  Procedure Laterality Date   CERVICAL CONE BIOPSY     GASTRIC BYPASS  2005    FAMILY HISTORY: Family History  Problem Relation Age of Onset   Colon cancer Mother    Depression Mother    Hypertension Father     SOCIAL HISTORY: Social History   Socioeconomic History   Marital status: Legally Separated    Spouse name: Not on file   Number of children: Not on file   Years of education: Not on file   Highest education level: Not on file  Occupational History   Not on file  Tobacco Use   Smoking status:  Former    Packs/day: 0.50    Years: 10.00    Additional pack years: 0.00    Total pack years: 5.00    Types: E-cigarettes, Cigarettes    Start date: 07/21/2012   Smokeless tobacco: Current   Tobacco comments:    uses nicotine pouches sometimes   Vaping Use   Vaping Use: Never used  Substance and Sexual Activity   Alcohol use: No   Drug use: No   Sexual activity: Yes    Partners: Male  Other Topics Concern   Not on file  Social History Narrative   Right handed   Caffeine maybe 1 cup/day   Lives at home with husband and 33 year old son    Social Determinants of Health   Financial Resource Strain: Not on file  Food Insecurity: Not on file  Transportation Needs: Not on file  Physical Activity: Not on file  Stress: Not on file  Social Connections: Not on file  Intimate Partner Violence: Not on file      PHYSICAL EXAM  Vitals:   07/17/22 1117  BP: 119/74  Pulse: 94  Weight: 147 lb 8 oz (66.9 kg)  Height: 5\' 5"  (1.651 m)     Body mass index is 24.55 kg/m.  Generalized: Well developed, in no acute distress but appears anxious and tremulous.   Neurological examination  Mentation: Alert oriented to time, place, history taking. Follows all commands speech and language fluent Cranial nerve II-XII: Pupils were equal round reactive to light. Extraocular movements were full, visual field were full on confrontational test. Facial sensation and strength were normal. Head turning and shoulder shrug  were normal and symmetric. Motor: The motor testing reveals 5 over 5 strength of all 4 extremities. Good symmetric motor tone is noted throughout.  Sensory: Sensory testing is intact to soft touch on all 4 extremities. No evidence of extinction is noted.  Coordination: Cerebellar testing reveals good finger-nose-finger  and heel-to-shin bilaterally.  Gait and station: Gait is normal.     DIAGNOSTIC DATA (LABS, IMAGING, TESTING) - I reviewed patient records, labs, notes, testing  and imaging myself where available.  Lab Results  Component Value Date   WBC 4.2 08/08/2021   HGB 12.2 08/08/2021   HCT 37.3 08/08/2021   MCV 87 08/08/2021   PLT 364 08/08/2021      Component Value Date/Time   NA 138 11/28/2021 0000   NA 140 08/08/2021 1106   K 5.0 11/28/2021 0000   CL 107 11/28/2021 0000   CO2 23 11/28/2021 0000   GLUCOSE 96 11/28/2021 0000   BUN 11 11/28/2021 0000   BUN 14 08/08/2021 1106   CREATININE 0.82 11/28/2021 0000   CALCIUM 8.9 11/28/2021 0000   CALCIUM 9.0 04/19/2013 0000   PROT 6.7 11/28/2021 0000   PROT 7.1 08/08/2021 1106   ALBUMIN 4.4 08/08/2021 1106   ALBUMIN 3.8 04/19/2013 0000   AST 29 11/28/2021 0000   ALT 31 (H) 11/28/2021 0000   ALKPHOS 83 08/08/2021 1106   BILITOT 0.2 11/28/2021 0000   BILITOT 0.3 08/08/2021 1106   GFRNONAA 109 04/05/2020 0711   GFRAA 126 04/05/2020 0711   Lab Results  Component Value Date   CHOL 205 (H) 02/20/2021   HDL 92 02/20/2021   LDLCALC 100 (H) 02/20/2021   TRIG 45 02/20/2021   CHOLHDL 2.2 02/20/2021   Lab Results  Component Value Date   HGBA1C 4.9 02/20/2021   Lab Results  Component Value Date   VITAMINB12 962 02/20/2021   Lab Results  Component Value Date   TSH 1.11 11/28/2021    ASSESSMENT AND PLAN 43 y.o. year old female  has a past medical history of Alcohol abuse, Anxiety, Contusion, and Seizure disorder (12/28/2018). here with:  1.  Seizures 2.  Depression  3.  Anxiety  Continue Lamictal XR 400 mg daily Continue with Venlafaxine and Fluoxetine. Please talk to your psychiatrist prior to making any changes to these medications.  She is also on Mirtazapine for sleep aid, advised her to communicate with her psychiatrist as Mirtazapine can cause weight gain.  Decrease Topiramate by 50 mg every 2 weeks, then discontinue the medication after 8 weeks.   Follow-up in 6 months or sooner if needed    Alric Ran, MD 07/17/2022, 11:23 AM [facility] [address],  [place], [place] [address] [phone]

## 2022-07-21 ENCOUNTER — Other Ambulatory Visit: Payer: Self-pay | Admitting: Physician Assistant

## 2022-07-21 DIAGNOSIS — F331 Major depressive disorder, recurrent, moderate: Secondary | ICD-10-CM

## 2022-07-21 DIAGNOSIS — F411 Generalized anxiety disorder: Secondary | ICD-10-CM

## 2022-07-22 ENCOUNTER — Encounter: Payer: Self-pay | Admitting: Physician Assistant

## 2022-07-22 ENCOUNTER — Telehealth (INDEPENDENT_AMBULATORY_CARE_PROVIDER_SITE_OTHER): Payer: BC Managed Care – PPO | Admitting: Physician Assistant

## 2022-07-22 VITALS — Wt 174.0 lb

## 2022-07-22 DIAGNOSIS — R7303 Prediabetes: Secondary | ICD-10-CM | POA: Diagnosis not present

## 2022-07-22 DIAGNOSIS — R251 Tremor, unspecified: Secondary | ICD-10-CM

## 2022-07-22 DIAGNOSIS — Z8639 Personal history of other endocrine, nutritional and metabolic disease: Secondary | ICD-10-CM | POA: Diagnosis not present

## 2022-07-22 DIAGNOSIS — M5136 Other intervertebral disc degeneration, lumbar region: Secondary | ICD-10-CM

## 2022-07-22 MED ORDER — SEMAGLUTIDE (1 MG/DOSE) 4 MG/3ML ~~LOC~~ SOPN: 1.0000 mg | PEN_INJECTOR | SUBCUTANEOUS | 0 refills | Status: DC

## 2022-07-22 NOTE — Progress Notes (Signed)
Pt would like to follow up on mood  and anxiety and iron levels

## 2022-07-22 NOTE — Progress Notes (Signed)
..Virtual Visit via Video Note  I connected with [name] on 07/29/22 at  9:50 AM EDT by a video enabled telemedicine application and verified that I am speaking with the correct person using two identifiers.  Location: Patient: home Provider: clinic  .Marland KitchenParticipating in visit:  Patient: Deanna Keller Provider: Tandy Gaw PA-C   I discussed the limitations of evaluation and management by telemedicine and the availability of in person appointments. The patient expressed understanding and agreed to proceed.  History of Present Illness: Pt is a 42 yo female with seizure disorder who presents to the clinic to discuss ozempic and weight loss.   Pt is doing great on ozempic. She has lost from 212 to 170. Her labs have improved and no longer obese. She wants to continue.   She continues to have tremor and neurology wanted her discuss taper of topamax. She does not feel like it has helped any with weight loss.  .. Active Ambulatory Problems    Diagnosis Date Noted   History of alcoholism 07/21/2013   B12 deficiency 07/21/2013   Iron deficiency 07/21/2013   Depression 11/10/2013   Elevated LFTs 11/10/2013   Chronic migraine 11/10/2013   Malabsorption 11/10/2013   S/P gastric bypass 11/11/2013   Vitamin D deficiency 11/11/2013   Recovering alcoholic in remission 06/16/2014   Lumbar degenerative disc disease 06/20/2014   Bariatric surgery status in pregnancy 02/10/2013   Phlebectasia 10/21/2011   Hypoglycemia following gastrointestinal surgery 04/12/2013   Generalized seizure 08/30/2014   IUD check up 11/24/2014   Postoperative blind loop syndrome 04/12/2013   Generalized anxiety disorder 09/06/2015   Stress reaction 06/16/2017   Class 2 obesity due to excess calories without serious comorbidity with body mass index (BMI) of 35.0 to 35.9 in adult 10/03/2017   Weight gain 10/03/2017   No energy 02/05/2018   Thyromegaly 02/05/2018   Upper back pain 02/05/2018   Hx of thyroid nodule  02/08/2018   Frequent infections 06/16/2018   Arthralgia 06/16/2018   Tachycardia 12/28/2018   Anxiety about health 12/28/2018   Insomnia 12/28/2018   Seizure 12/28/2018   Chronic rhinitis 04/03/2020   Moderate episode of recurrent major depressive disorder 09/12/2020   Post-seizure headache 09/12/2020   Constipation 03/05/2021   Bright red blood per rectum 03/06/2021   Acute buttock pain 03/06/2021   Fall 03/06/2021   Pre-diabetes 07/29/2022   History of obesity 07/29/2022   Resolved Ambulatory Problems    Diagnosis Date Noted   Lumbar back pain with radiculopathy affecting left lower extremity 10/11/2014   Viral URI 12/20/2014   Benzodiazepine misuse (HCC) 09/06/2015   Acute bronchitis 12/26/2015   Traumatic coccydynia 03/05/2021   Past Medical History:  Diagnosis Date   Alcohol abuse    Anxiety    Contusion    Seizure disorder 12/28/2018          Observations/Objective: No acute distress Normal mood and appearance   Assessment and Plan: Marland [name] KitchenRavynn was seen today for follow-up.  Diagnoses and all orders for this visit:  Pre-diabetes -     Semaglutide, 1 MG/DOSE, 4 MG/3ML SOPN; Inject 1 mg as directed once a week.  History of obesity -     Semaglutide, 1 MG/DOSE, 4 MG/3ML SOPN; Inject 1 mg as directed once a week.  Lumbar degenerative disc disease -     Semaglutide, 1 MG/DOSE, 4 MG/3ML SOPN; Inject 1 mg as directed once a week.   Pt has been doing great with weight loss on ozempic would like to continue  based on success, history of obesity, and pre-diabetes.  Refilled today.  Weight loss would greatly benefit her chronic pain from lumbar DDD. Pt does not feel like topamax is helpful and could be causing tremors, discussed taper  am,  pm for 1 week  am,  pm for 1 week  am, none 1 week Stop all topamax Decrease effexor to  daily.  Follow up on tremor  Follow up in 3 months.    Follow Up Instructions:    I discussed the  assessment and treatment plan with the patient. The patient was provided an opportunity to ask questions and all were answered. The patient agreed with the plan and demonstrated an understanding of the instructions.   The patient was advised to call back or seek an in-person evaluation if the symptoms worsen or if the condition fails to improve as anticipated.    Tandy Gaw, PA-C

## 2022-07-29 ENCOUNTER — Encounter: Payer: Self-pay | Admitting: Physician Assistant

## 2022-07-29 DIAGNOSIS — Z8639 Personal history of other endocrine, nutritional and metabolic disease: Secondary | ICD-10-CM | POA: Insufficient documentation

## 2022-07-29 DIAGNOSIS — R251 Tremor, unspecified: Secondary | ICD-10-CM | POA: Insufficient documentation

## 2022-07-29 DIAGNOSIS — R7303 Prediabetes: Secondary | ICD-10-CM | POA: Insufficient documentation

## 2022-07-31 ENCOUNTER — Other Ambulatory Visit: Payer: Self-pay | Admitting: Physician Assistant

## 2022-08-01 ENCOUNTER — Ambulatory Visit: Payer: BC Managed Care – PPO | Admitting: Neurology

## 2022-08-02 MED ORDER — AMBULATORY NON FORMULARY MEDICATION: 0 refills | Status: DC

## 2022-08-05 ENCOUNTER — Other Ambulatory Visit: Payer: Self-pay | Admitting: Physician Assistant

## 2022-08-05 DIAGNOSIS — G43911 Migraine, unspecified, intractable, with status migrainosus: Secondary | ICD-10-CM

## 2022-08-06 NOTE — Telephone Encounter (Signed)
FYI

## 2022-08-08 ENCOUNTER — Other Ambulatory Visit: Payer: Self-pay

## 2022-08-08 DIAGNOSIS — Z8639 Personal history of other endocrine, nutritional and metabolic disease: Secondary | ICD-10-CM

## 2022-08-08 MED ORDER — AMBULATORY NON FORMULARY MEDICATION: 0 refills | Status: DC

## 2022-08-08 MED ORDER — AMBULATORY NON FORMULARY MEDICATION
0 refills | Status: DC
Start: 2022-08-08 — End: 2022-08-08

## 2022-08-08 MED ORDER — AMBULATORY NON FORMULARY MEDICATION
0 refills | Status: DC
Start: 2022-08-08 — End: 2022-08-30

## 2022-08-21 DIAGNOSIS — Z79899 Other long term (current) drug therapy: Secondary | ICD-10-CM | POA: Diagnosis not present

## 2022-08-21 DIAGNOSIS — S0101XA Laceration without foreign body of scalp, initial encounter: Secondary | ICD-10-CM | POA: Diagnosis not present

## 2022-08-21 DIAGNOSIS — F329 Major depressive disorder, single episode, unspecified: Secondary | ICD-10-CM | POA: Diagnosis not present

## 2022-08-21 DIAGNOSIS — I959 Hypotension, unspecified: Secondary | ICD-10-CM | POA: Diagnosis not present

## 2022-08-21 DIAGNOSIS — T797XXA Traumatic subcutaneous emphysema, initial encounter: Secondary | ICD-10-CM | POA: Diagnosis not present

## 2022-08-21 DIAGNOSIS — F419 Anxiety disorder, unspecified: Secondary | ICD-10-CM | POA: Diagnosis not present

## 2022-08-21 DIAGNOSIS — R569 Unspecified convulsions: Secondary | ICD-10-CM | POA: Diagnosis not present

## 2022-08-21 DIAGNOSIS — Z885 Allergy status to narcotic agent status: Secondary | ICD-10-CM | POA: Diagnosis not present

## 2022-08-21 DIAGNOSIS — W19XXXA Unspecified fall, initial encounter: Secondary | ICD-10-CM | POA: Diagnosis not present

## 2022-08-21 DIAGNOSIS — G40909 Epilepsy, unspecified, not intractable, without status epilepticus: Secondary | ICD-10-CM | POA: Diagnosis not present

## 2022-08-21 DIAGNOSIS — D649 Anemia, unspecified: Secondary | ICD-10-CM | POA: Diagnosis not present

## 2022-08-21 DIAGNOSIS — Z888 Allergy status to other drugs, medicaments and biological substances status: Secondary | ICD-10-CM | POA: Diagnosis not present

## 2022-08-21 DIAGNOSIS — F1020 Alcohol dependence, uncomplicated: Secondary | ICD-10-CM | POA: Diagnosis not present

## 2022-08-21 DIAGNOSIS — R58 Hemorrhage, not elsewhere classified: Secondary | ICD-10-CM | POA: Diagnosis not present

## 2022-08-21 DIAGNOSIS — F17290 Nicotine dependence, other tobacco product, uncomplicated: Secondary | ICD-10-CM | POA: Diagnosis not present

## 2022-08-21 DIAGNOSIS — Z881 Allergy status to other antibiotic agents status: Secondary | ICD-10-CM | POA: Diagnosis not present

## 2022-08-21 DIAGNOSIS — W28XXXA Contact with powered lawn mower, initial encounter: Secondary | ICD-10-CM | POA: Diagnosis not present

## 2022-08-23 ENCOUNTER — Telehealth (INDEPENDENT_AMBULATORY_CARE_PROVIDER_SITE_OTHER): Payer: BC Managed Care – PPO | Admitting: Neurology

## 2022-08-23 ENCOUNTER — Encounter: Payer: Self-pay | Admitting: Neurology

## 2022-08-23 DIAGNOSIS — F32A Depression, unspecified: Secondary | ICD-10-CM | POA: Diagnosis not present

## 2022-08-23 DIAGNOSIS — G40909 Epilepsy, unspecified, not intractable, without status epilepticus: Secondary | ICD-10-CM

## 2022-08-23 DIAGNOSIS — F419 Anxiety disorder, unspecified: Secondary | ICD-10-CM | POA: Diagnosis not present

## 2022-08-23 MED ORDER — HYDROCODONE-ACETAMINOPHEN 5-325 MG PO TABS: 1.0000 | ORAL_TABLET | Freq: Four times a day (QID) | ORAL | 0 refills | Status: DC | PRN

## 2022-08-23 MED ORDER — ZONISAMIDE 100 MG PO CAPS: 300.0000 mg | ORAL_CAPSULE | Freq: Every evening | ORAL | 5 refills | Status: DC

## 2022-08-23 NOTE — Progress Notes (Signed)
PATIENT: Deanna Keller DOB: 1980-08-10  REASON FOR VISIT: follow up HISTORY FROM: patient PRIMARY NEUROLOGIST: Dr. Teresa Coombs  Chief Complaint  Patient presents with   Seizures    Follow up, had a breakthrough seizure on 08/21/2022     HISTORY OF PRESENT ILLNESS: Today 08/23/22: Patient was called today for video visit, last visit was on April 3.  At that time due to side effect of tremor plan was to decrease her Topamax.  Again the Topamax was used for appetite suppression.  She reports going from Topamax 100 mg twice daily to 50 mg twice daily and had a breakthrough seizure on March 8.  She was mowing her lawn and she was found between the pavement and the street by her neighbor.  She hit her head sustaining a laceration.  She did have a generalized tonic-clonic seizure.  In the ED she did have a few staples for laceration, her head CT was negative for any acute abnormality and she was discharged home with Keppra 500 mg twice daily.  Since being home she has not had any seizure, she is compliant with the medication but reported increased anxiety and fear of having another seizure and she did have pressure like headaches last night    INTERVAL HISTORY 07/17/2022:  [name] presents today for follow-up, last visit was in October, since then, she has not had any seizures.  She is doing well on lamotrigine XR 400 mg daily.  On top of that she is also on Topamax for appetite suppression but the Topamax is giving her tremors.  She still struggling with her depression and following with psychiatry, she is on venlafaxine and fluoxetine.  Again no seizure or seizure-like activity. She is off Gabapentin   INTERVAL HISTORY 01/30/2022: Patient presents today for follow-up, last visit was in April, at that time we discontinued her gabapentin.  She tolerated the procedure well.  She is currently on Lamotrigine 400 mg daily. Denies any seizure or seizure-like activity.  She feels she is more depressed than  usual, currently she is separated and taking care of her son.  She is not wanting to do anything, does not want to go to work, stated after dropping her son to school in the morning she is just sitting on the couch all day.  She did follow-up with up with a psychiatrist who wanted to start her on Wellbutrin but due to her seizure disorder she did not want to start the Wellbutrin and has not seen the psychiatrist again for follow up.  Currently she is on Effexor and xanax.  She reported tried in the past Zoloft, Cymbalta, Lexapro and other antidepressants but she does not remember the name.   INTERVAL HISTORY 08/09/2021:  Deanna Keller is a 42 year old female with a history of seizures.  She returns today for follow-up.  She is currently on Lamictal XR 400 mg twice a day.  She denies any seizure events.  Reports that last seizure was when she decrease her dose of Lamictal.  The patient states that she is on gabapentin 600 mg 3 times a day.  She reports that she has gained weight on this medication and would like it discontinued.  She would like our office to instruct her on the weaning process as she is very fearful of having a breakthrough seizure.  She was also recently placed on Topamax 100 mg twice a day as an appetite suppressant.  Patient acknowledges when she was on Lamictal in the past she  did not have any breakthrough seizures until she was instructed to decrease her dose.  HISTORY ( copied from Dr. Teresa Coombs) Patient presents today for follow-up, last visit was on June 2022. At that time she reported 2 additional seizures after her lamotrigine was decreased from 400 mg a day to 300 mg a day.  Since then her lamotrigine has been increased back to 400 mg daily.  Since her last visit she denies any additional seizures.  She reported that her mood is better, sleep is better, she cut down on her caffeine and cut down on her work.  Currently she does not have any complaint or concern but just wanted to know the  level of Lamictal and gabapentin.  Last seizure was May 2022     Initial history from Dr Anne Hahn:  Deanna Keller is a 42 year old right-handed white female with a history of seizures 4 years ago while taking Ultram.  She had 2 seizures at that time, she was placed on Lamictal, she claims she had an EEG study and she believes she had an MRI but she is not sure, the records support that she had a noncontrast CT scan of the head for a concussion in 2013.  The patient was placed on Lamictal, she has been on 300 mg a day of this medication since that time but she does not always take the medication properly, she misses doses frequently.  She was placed on Wellbutrin for weight loss, 8 days ago she suffered another seizure, she was in the car with her husband who was driving and the patient had a witnessed event.  The patient started with a confusional event, she was talking nonsense and then went into a stiffening event, she did not bite her tongue or lose control of bowels or the bladder.  The event lasted about 1 minute.  The patient had no recollection of events during that time.  She has been increased on the Lamictal taking 200 mg twice daily.  She was taken off of the Wellbutrin.  She is sent to this office for an evaluation.  She denies any headaches, dizziness, vision changes, or any numbness or weakness of the face, arms, legs.  She denies any balance problems.  She does have a prior history of alcohol abuse but she has not had anything to drink in 8 years.  She denies any illicit drug use such as cocaine or marijuana.  She does use Xanax in low-dose, taking 0.5 mg twice daily.  The patient does report chronic fatigue issues and daytime drowsiness.     Handedness: Right handed    Seizure Type: passing out, being stiff, clinched teeth, unconscious    Current frequency: Last seizure in May 2022   Any injuries from seizures: None    Seizure risk factors: None    Previous ASMs: Lamotrigine, Gabapentin     Currenty ASMs: Lamotrigine XR 400 mg daily   ASMs side effects: None    Brain Images: Nonspecific white matter hyperintensities   Previous EEGs: Normal routine EEG     OTHER MEDICAL CONDITIONS: Anxiety/Depression, Seizure, Gastric bypass surgery   REVIEW OF SYSTEMS: Out of a complete 14 system review of symptoms, the patient complains only of the following symptoms, and all other reviewed systems are negative.  ALLERGIES: Allergies  Allergen Reactions   Augmentin [Amoxicillin-Pot Clavulanate] Nausea And Vomiting   Tramadol Other (See Comments)    Possible seizure   Wellbutrin [Bupropion]     Seizure   Phentermine  Increases seizure risk    HOME MEDICATIONS: Outpatient Medications Prior to Visit  Medication Sig Dispense Refill   ALPRAZolam (XANAX) 1 MG tablet TAKE 1 TABLET BY MOUTH TWICE A DAY 60 tablet 5   AMBULATORY NON FORMULARY MEDICATION Semaglutide 2.5mg  with pyridoxine 10mg  per mL  Inject .3mg  subcutaneously once weekly for 4 weeks then increase to next dose 2 mL 0   BD INSULIN SYRINGE U/F 31G X 5/16" 1 ML MISC USE ONCE WITH VITAMIN B12 INJECTIONS 100 each 12   Cyanocobalamin 1000 MCG/ML KIT Inject 1 mL every two weeks. 20 kit 2   cyclobenzaprine (FLEXERIL) 10 MG tablet Take 1 tablet (10 mg total) by mouth 3 (three) times daily as needed. 45 tablet 0   FLUoxetine (PROZAC) 20 MG tablet TAKE 1 TABLET BY MOUTH EVERY DAY 90 tablet 1   Insulin Pen Needle 31G X 6 MM MISC Use once with vitamin B12 injections 100 each 0   LamoTRIgine 200 MG TB24 24 hour tablet TAKE 2 TABLETS BY MOUTH EVERY DAY 180 tablet 1   Semaglutide, 1 MG/DOSE, 4 MG/3ML SOPN Inject 1 mg as directed once a week. 3 mL 0   venlafaxine XR (EFFEXOR-XR) 150 MG 24 hr capsule TAKE 1 CAPSULE BY MOUTH DAILY WITH BREAKFAST. 90 capsule 1   mirtazapine (REMERON) 7.5 MG tablet TAKE 1 TABLET BY MOUTH AT BEDTIME. 90 tablet 1   topiramate (TOPAMAX) 100 MG tablet Take 100 mg by mouth 2 (two) times daily.     No  facility-administered medications prior to visit.    PAST MEDICAL HISTORY: Past Medical History:  Diagnosis Date   Alcohol abuse    sober since 2011   Anxiety    Contusion    tail bone contusion in Nov 2022   Seizure disorder (HCC) 12/28/2018    PAST SURGICAL HISTORY: Past Surgical History:  Procedure Laterality Date   CERVICAL CONE BIOPSY     GASTRIC BYPASS  2005    FAMILY HISTORY: Family History  Problem Relation Age of Onset   Colon cancer Mother    Depression Mother    Hypertension Father     SOCIAL HISTORY: Social History   Socioeconomic History   Marital status: Legally Separated    Spouse name: Not on file   Number of children: Not on file   Years of education: Not on file   Highest education level: Bachelor's degree (e.g., BA, AB, BS)  Occupational History   Not on file  Tobacco Use   Smoking status: Former    Packs/day: 0.50    Years: 10.00    Additional pack years: 0.00    Total pack years: 5.00    Types: E-cigarettes, Cigarettes    Start date: 07/21/2012   Smokeless tobacco: Current   Tobacco comments:    uses nicotine pouches sometimes   Vaping Use   Vaping Use: Never used  Substance and Sexual Activity   Alcohol use: No   Drug use: No   Sexual activity: Yes    Partners: Male  Other Topics Concern   Not on file  Social History Narrative   Right handed   Caffeine maybe 1 cup/day   Lives at home with husband and 54 year old son    Social Determinants of Health   Financial Resource Strain: Low Risk  (07/22/2022)   Overall Financial Resource Strain (CARDIA)    Difficulty of Paying Living Expenses: Not very hard  Food Insecurity: No Food Insecurity (07/22/2022)   Hunger Vital Sign  Worried About Programme researcher, broadcasting/film/video in the Last Year: Never true    Ran Out of Food in the Last Year: Never true  Transportation Needs: No Transportation Needs (07/22/2022)   PRAPARE - Administrator, Civil Service (Medical): No    Lack of Transportation  (Non-Medical): No  Physical Activity: Insufficiently Active (07/22/2022)   Exercise Vital Sign    Days of Exercise per Week: 1 day    Minutes of Exercise per Session: 20 min  Stress: Stress Concern Present (07/22/2022)   [employer] of Occupational Health - Occupational Stress Questionnaire    Feeling of Stress : To some extent  Social Connections: Moderately Integrated (07/22/2022)   Social Connection and Isolation Panel [NHANES]    Frequency of Communication with Friends and Family: More than three times a week    Frequency of Social Gatherings with Friends and Family: More than three times a week    Attends Religious Services: More than 4 times per year    Active Member of [employer] or Organizations: Yes    Attends Engineer, structural: More than 4 times per year    Marital Status: Separated  Intimate Partner Violence: Not on file      PHYSICAL EXAM  There were no vitals filed for this visit.    There is no height or weight on file to calculate BMI.  Generalized: Well developed, in no acute distress but appears anxious and tremulous.   Mentation: Alert oriented to time, place, history taking. Follows all commands speech and language fluent     DIAGNOSTIC DATA (LABS, IMAGING, TESTING) - I reviewed patient records, labs, notes, testing and imaging myself where available.  Lab Results  Component Value Date   WBC 4.2 08/08/2021   HGB 12.2 08/08/2021   HCT 37.3 08/08/2021   MCV 87 08/08/2021   PLT 364 08/08/2021      Component Value Date/Time   NA 138 11/28/2021 0000   NA 140 08/08/2021 1106   K 5.0 11/28/2021 0000   CL 107 11/28/2021 0000   CO2 23 11/28/2021 0000   GLUCOSE 96 11/28/2021 0000   BUN 11 11/28/2021 0000   BUN 14 08/08/2021 1106   CREATININE 0.82 11/28/2021 0000   CALCIUM 8.9 11/28/2021 0000   CALCIUM 9.0 04/19/2013 0000   PROT 6.7 11/28/2021 0000   PROT 7.1 08/08/2021 1106   ALBUMIN 4.4 08/08/2021 1106   ALBUMIN 3.8 04/19/2013 0000   AST  29 11/28/2021 0000   ALT 31 (H) 11/28/2021 0000   ALKPHOS 83 08/08/2021 1106   BILITOT 0.2 11/28/2021 0000   BILITOT 0.3 08/08/2021 1106   GFRNONAA 109 04/05/2020 0711   GFRAA 126 04/05/2020 0711   Lab Results  Component Value Date   CHOL 205 (H) 02/20/2021   HDL 92 02/20/2021   LDLCALC 100 (H) 02/20/2021   TRIG 45 02/20/2021   CHOLHDL 2.2 02/20/2021   Lab Results  Component Value Date   HGBA1C 4.9 02/20/2021   Lab Results  Component Value Date   VITAMINB12 962 02/20/2021   Lab Results  Component Value Date   TSH 1.11 11/28/2021    Head CT 08/21/2022 Mild left lateral posterior parietal scalp soft tissue swelling with subcutaneous emphysema without underlying calvarial fracture.  No acute intracranial abnormality.    ASSESSMENT AND PLAN 42 y.o. year old female  has a past medical history of Alcohol abuse, Anxiety, Contusion, and Seizure disorder (HCC) (12/28/2018). here with:  1.  Epilepsy 2.  Depression  3.  Anxiety  Continue Lamictal XR 400 mg daily Continue with Venlafaxine and Fluoxetine. Please talk to your psychiatrist prior to making any changes to these medications.  Start Zonisamide 100 mg nightly for 3 days, then increase to 200 mg nightly for another 3 days then further increase to 300 mg nightly. Continue Topiramate 50 mg twice daily for a total of 6 days and discontinue when you start taking Zonisamide 300 mg nightly  Continue with Keppra 500 mg twice daily (started at the hospital) for 3 days and discontinue it when you start taking Zonisamide 200 mg nightly.  Follow-up in 6 months or sooner if needed     Virtual Visit via Video Note  I connected with  [name] on 08/23/22 at  9:45 AM EDT by a video enabled telemedicine application and verified that I am speaking with the correct person using two identifiers.  Location: Patient: Home Provider: [facility] Office   I discussed the limitations of evaluation and management by telemedicine and the  availability of in person appointments. The patient expressed understanding and agreed to proceed.   I discussed the assessment and treatment plan with the patient. The patient was provided an opportunity to ask questions and all were answered. The patient agreed with the plan and demonstrated an understanding of the instructions.   The patient was advised to call back or seek an in-person evaluation if the symptoms worsen or if the condition fails to improve as anticipated.  I provided 30 minutes of non-face-to-face time during this encounter.    Windell Norfolk, MD 08/23/2022, 11:41 AM [facility] [address] [place], [place] [address] [phone]

## 2022-08-23 NOTE — Patient Instructions (Signed)
Continue Lamictal XR 400 mg daily Continue with Venlafaxine and Fluoxetine. Please talk to your psychiatrist prior to making any changes to these medications.  Start Zonisamide 100 mg nightly for 3 days, then increase to 200 mg nightly for another 3 days then further increase to 300 mg nightly. Continue Topiramate 50 mg twice daily for a total of 6 days and discontinue when you start taking Zonisamide 300 mg nightly  Continue with Keppra 500 mg twice daily (started at the hospital) for 3 days and discontinue it when you start taking Zonisamide 200 mg nightly.  Follow-up in 6 months or sooner if needed

## 2022-08-26 ENCOUNTER — Other Ambulatory Visit: Payer: Self-pay

## 2022-08-26 NOTE — Telephone Encounter (Signed)
Requested Prescriptions   Pending Prescriptions Disp Refills   HYDROcodone-acetaminophen (NORCO) 5-325 MG tablet 15 tablet 0    Sig: Take 1 tablet by mouth every 6 (six) hours as needed for up to 7 days for moderate pain.   Last seen 08/23/22, next appt scheduled 01/23/23  I couldn't check the registry as I have not been given acces. Routing to the provider to determine if refill is needed.

## 2022-08-28 ENCOUNTER — Other Ambulatory Visit: Payer: Self-pay | Admitting: Physician Assistant

## 2022-08-28 DIAGNOSIS — R7303 Prediabetes: Secondary | ICD-10-CM

## 2022-08-28 DIAGNOSIS — M5136 Other intervertebral disc degeneration, lumbar region: Secondary | ICD-10-CM

## 2022-08-28 DIAGNOSIS — Z8639 Personal history of other endocrine, nutritional and metabolic disease: Secondary | ICD-10-CM

## 2022-08-30 ENCOUNTER — Encounter: Payer: Self-pay | Admitting: Physician Assistant

## 2022-08-30 ENCOUNTER — Ambulatory Visit (INDEPENDENT_AMBULATORY_CARE_PROVIDER_SITE_OTHER): Payer: BC Managed Care – PPO | Admitting: Physician Assistant

## 2022-08-30 VITALS — BP 104/60 | HR 98 | Ht 65.0 in | Wt 165.0 lb

## 2022-08-30 DIAGNOSIS — Z09 Encounter for follow-up examination after completed treatment for conditions other than malignant neoplasm: Secondary | ICD-10-CM

## 2022-08-30 DIAGNOSIS — S0101XD Laceration without foreign body of scalp, subsequent encounter: Secondary | ICD-10-CM

## 2022-08-30 DIAGNOSIS — Z8639 Personal history of other endocrine, nutritional and metabolic disease: Secondary | ICD-10-CM

## 2022-08-30 DIAGNOSIS — Z4802 Encounter for removal of sutures: Secondary | ICD-10-CM

## 2022-08-30 DIAGNOSIS — G4700 Insomnia, unspecified: Secondary | ICD-10-CM

## 2022-08-30 DIAGNOSIS — R569 Unspecified convulsions: Secondary | ICD-10-CM | POA: Diagnosis not present

## 2022-08-30 DIAGNOSIS — F0781 Postconcussional syndrome: Secondary | ICD-10-CM

## 2022-08-30 MED ORDER — HYDROCODONE-ACETAMINOPHEN 5-325 MG PO TABS
1.0000 | ORAL_TABLET | Freq: Four times a day (QID) | ORAL | 0 refills | Status: AC | PRN
Start: 2022-08-30 — End: 2022-09-06

## 2022-08-30 MED ORDER — TEMAZEPAM 7.5 MG PO CAPS
ORAL_CAPSULE | ORAL | 0 refills | Status: DC
Start: 2022-08-30 — End: 2022-09-20

## 2022-08-30 MED ORDER — KETOROLAC TROMETHAMINE 30 MG/ML IJ SOLN
30.0000 mg | Freq: Once | INTRAMUSCULAR | Status: AC
Start: 2022-08-30 — End: 2022-08-30
  Administered 2022-08-30: 30 mg via INTRAMUSCULAR

## 2022-08-30 MED ORDER — AMBULATORY NON FORMULARY MEDICATION
1 refills | Status: DC
Start: 2022-08-30 — End: 2023-11-28

## 2022-08-30 MED ORDER — DEXAMETHASONE SODIUM PHOSPHATE 10 MG/ML IJ SOLN
10.0000 mg | Freq: Once | INTRAMUSCULAR | Status: AC
Start: 2022-08-30 — End: 2022-08-30
  Administered 2022-08-30: 10 mg via INTRAMUSCULAR

## 2022-08-30 MED ORDER — AMBULATORY NON FORMULARY MEDICATION
0 refills | Status: DC
Start: 2022-08-30 — End: 2022-10-01

## 2022-08-30 MED ORDER — PROMETHAZINE HCL 25 MG/ML IJ SOLN
25.0000 mg | Freq: Once | INTRAMUSCULAR | Status: AC
Start: 2022-08-30 — End: 2022-08-30
  Administered 2022-08-30: 25 mg via INTRAMUSCULAR

## 2022-08-30 NOTE — Patient Instructions (Addendum)
Start temazepam for sleep.  Continue to use flexeril for neck pain and tightness Migraine cocktail given today Use xanax during the day You have norco at pharmacy for break through headache  Post-Concussion Syndrome  A concussion is a brain injury from a direct hit to the head or body. This hit causes the brain to shake back and forth fast inside the skull. The shaking can damage brain cells and cause chemical changes in the brain. Concussions are normally not life-threatening but can cause serious symptoms. Post-concussion syndrome is when symptoms that happen after a concussion last longer than normal. These symptoms can last from weeks to months. What are the causes? The cause of this condition is not known. It can happen whether your head injury was mild or severe. What increases the risk? You are more likely to get this condition if: You are female. You are a child, teen, or young adult. You have had a head injury before. You have a history of headaches. You have depression or anxiety. You have more than one symptom or severe symptoms when your concussion occurs. You faint or cannot remember the event (have amnesia of the event). What are the signs or symptoms? Symptoms of this condition include: Physical symptoms, such as: Headaches. Tiredness. Dizziness and weakness. Blurred vision and sensitivity to light. Trouble hearing. Problems with balance. Mental and emotional symptoms, such as: Memory problems and trouble focusing. Trouble falling asleep or staying asleep (insomnia). Feeling irritable. Anxiety or depression. Trouble learning new things. How is this diagnosed? This condition may be diagnosed based on: Your symptoms. A description of your injury. Your medical history. Testing your strength, balance, and nerve function (neurological exam). Your health care provider may order other tests. These may include: Brain imaging, such as a CT scan or an MRI. Memory  testing (neuropsychological testing). How is this treated? Treatment for this condition may depend on your symptoms. Symptoms normally go away on their own with time. If you need treatment, it may include: Medicines for headaches, anxiety, depression, and insomnia. Resting your brain and body for a few days after your injury. Rehab therapy, such as: Physical or occupational therapy. This may include exercises to help with balance and dizziness. Mental health counseling. A form of talk therapy called cognitive behavioral therapy (CBT) can be especially helpful. This therapy helps you set goals and follow up on the changes that you make. Speech therapy. Vision therapy. A brain and eye specialist can recommend treatments for vision problems. Follow these instructions at home: Medicines Take over-the-counter and prescription medicines only as told by your health care provider. Avoid opioid prescription pain medicines when getting over a concussion. Activity Limit your mental activities for the first few days after your injury. This may include not doing these things: Homework or work for your job. Complex thinking. Watching TV. Using a computer or phone. Playing memory games and puzzles. Slowly return to your normal activity level. If a certain activity brings on your symptoms, stop or slow down until you can do the activity without getting symptoms again. Limit physical activity, such as sports or strenuous activities, for the first few days after a concussion. Slowly return to normal activity as told by your health care provider. Light exercise may be helpful. Rest helps your brain heal. Make sure you: Get plenty of sleep at night. Most adults should get at least 7-9 hours of sleep each night. Rest during the day. Take naps or rest breaks when you feel tired. Do not  do high-risk activities that could cause a second concussion, such as riding a bike or playing sports. Having another concussion  before the first one has healed can be harmful. General instructions  Do not drink alcohol until your health care provider says that you can. Keep track of how severe your symptoms are and how often they happen. Give this information to your health care provider. Keep all follow-up visits. Your health care provider may need to check you for new or serious symptoms. Where to find more information [employer]: concussionfoundation.org Contact a health care provider if: Your symptoms do not improve. You get injured again. You have unusual behavior changes. Get help right away if: You have a severe or worsening headache. You have weakness or numbness in any part of your body. You vomit repeatedly. You have mental status changes, such as: Confusion. Trouble speaking. Trouble staying awake. Fainting. You have a seizure. These symptoms may be an emergency. Get help right away. Call 911. Do not wait to see if the symptoms will go away. Do not drive yourself to the hospital. Also, get help right away if: You think about hurting yourself or others. Take one of these steps if you feel like you may hurt yourself or others, or have thoughts about taking your own life: Go to your nearest emergency room. Call 911. Call the National Suicide Prevention Lifeline at 279-463-5733 or 988. This is open 24 hours a day. Text the Crisis Text Line at 801-664-5051. This information is not intended to replace advice given to you by your health care provider. Make sure you discuss any questions you have with your health care provider. Document Revised: 08/24/2021 Document Reviewed: 08/24/2021 Elsevier Patient Education  2023 [employer].

## 2022-08-30 NOTE — Progress Notes (Unsigned)
Established Patient Office Visit  Subjective   Patient ID: Deanna Keller, female    DOB: [birth year]  Age: 42 y.o. MRN: [id number]  Chief Complaint  Patient presents with   Suture / Staple Removal   Headache    HPI Pt is a 42 yo female who presents to the clinic with mother to follow up from 08/21/2022 seizure and ED visit.   Pt was found on 08/21/2022 in her yard collapsed by lawnmower. She has not had alcohol in 13 years. She reports to be taking medication as prescribed except she has taken less xanax because she is cutting them in half to take at night to help sleep. CT of head normal. CT cervical spine no acute findings. One staple used to close laceration of left parietal head. No URI symptoms. No significant abnormalities in blood work. She was given norco for HA and keppra for seizure.   Telephone visit with neurology: 08/23/2022  ASSESSMENT AND PLAN 42 y.o. year old female  has a past medical history of Alcohol abuse, Anxiety, Contusion, and Seizure disorder (HCC) (12/28/2018). here with:   1.  Epilepsy 2.  Depression  3.  Anxiety   Continue Lamictal XR 400 mg daily Continue with Venlafaxine and Fluoxetine. Please talk to your psychiatrist prior to making any changes to these medications.  Start Zonisamide 100 mg nightly for 3 days, then increase to 200 mg nightly for another 3 days then further increase to 300 mg nightly. Continue Topiramate 50 mg twice daily for a total of 6 days and discontinue when you start taking Zonisamide 300 mg nightly  Continue with Keppra 500 mg twice daily (started at the hospital) for 3 days and discontinue it when you start taking Zonisamide 200 mg nightly.  Follow-up in 6 months or sooner if needed  She continues to have significant headache across whole forehead and describes as "squeezing".  She request a few more norco since it is the only thing that helps with pain.   She continues to have nothing except xanax that helps her sleep that works  and she can afford and does not cause weight gain. Failed trazodone, ambien, rozeam, belsomnra, seroquel, remeron.   Needs wegovy script written [facility].   .. Active Ambulatory Problems    Diagnosis Date Noted   History of alcoholism (HCC) 07/21/2013   B12 deficiency 07/21/2013   Iron deficiency 07/21/2013   Depression 11/10/2013   Elevated LFTs 11/10/2013   Chronic migraine 11/10/2013   Malabsorption 11/10/2013   S/P gastric bypass 11/11/2013   Vitamin D deficiency 11/11/2013   Recovering alcoholic in remission (HCC) 06/16/2014   Lumbar degenerative disc disease 06/20/2014   Bariatric surgery status in pregnancy 02/10/2013   Phlebectasia 10/21/2011   Hypoglycemia following gastrointestinal surgery 04/12/2013   Generalized seizure (HCC) 08/30/2014   IUD check up 11/24/2014   Postoperative blind loop syndrome 04/12/2013   Generalized anxiety disorder 09/06/2015   Stress reaction 06/16/2017   Class 2 obesity due to excess calories without serious comorbidity with body mass index (BMI) of 35.0 to 35.9 in adult 10/03/2017   Weight gain 10/03/2017   No energy 02/05/2018   Thyromegaly 02/05/2018   Upper back pain 02/05/2018   Hx of thyroid nodule 02/08/2018   Frequent infections 06/16/2018   Arthralgia 06/16/2018   Tachycardia 12/28/2018   Anxiety about health 12/28/2018   Insomnia 12/28/2018   Seizure (HCC) 12/28/2018   Chronic rhinitis 04/03/2020   Moderate episode of recurrent major  depressive disorder (HCC) 09/12/2020   Post-seizure headache 09/12/2020   Constipation 03/05/2021   Bright red blood per rectum 03/06/2021   Acute buttock pain 03/06/2021   Fall 03/06/2021   Pre-diabetes 07/29/2022   History of obesity 07/29/2022   Tremor 07/29/2022   Post concussion syndrome 09/02/2022   Resolved Ambulatory Problems    Diagnosis Date Noted   Lumbar back pain with radiculopathy affecting left lower extremity 10/11/2014   Viral URI 12/20/2014    Benzodiazepine misuse (HCC) 09/06/2015   Acute bronchitis 12/26/2015   Traumatic coccydynia 03/05/2021   Past Medical History:  Diagnosis Date   Alcohol abuse    Anxiety    Contusion    Seizure disorder (HCC) 12/28/2018     ROS See HPI.    Objective:     BP 104/60   Pulse 98   Ht 5\' 5"  (1.651 m)   Wt 165 lb (74.8 kg)   SpO2 100%   BMI 27.46 kg/m  BP Readings from Last 3 Encounters:  08/30/22 104/60  07/17/22 119/74  06/05/22 135/80   Wt Readings from Last 3 Encounters:  08/30/22 165 lb (74.8 kg)  07/22/22 174 lb (78.9 kg)  07/17/22 147 lb 8 oz (66.9 kg)      Physical Exam Constitutional:      Appearance: She is well-developed.  HENT:     Head:     Comments: Left parietal skull 1cm healed laceration with one suture to close wound Cardiovascular:     Rate and Rhythm: Normal rate and regular rhythm.  Pulmonary:     Effort: Pulmonary effort is normal.  Neurological:     Mental Status: She is alert and oriented to person, place, and time.  Psychiatric:        Mood and Affect: Mood normal.        Assessment & Plan:  Marland [name] KitchenOlean was seen today for suture / staple removal and headache.  Diagnoses and all orders for this visit:  Hospital discharge follow-up  History of obesity -     AMBULATORY NON FORMULARY MEDICATION; Semaglutide 2.5mg  with pyridoxine 10mg  per mL  Inject .6mg (24units) subcutaneously once weekly for 4 weeks then increase to next dose -     AMBULATORY NON FORMULARY MEDICATION; Semaglutide 2.5mg  with pyridoxine 10mg  per mL Inject 1.2mg (48units) once weekly  Seizure (HCC)  Post concussion syndrome -     HYDROcodone-acetaminophen (NORCO) 5-325 MG tablet; Take 1 tablet by mouth every 6 (six) hours as needed for up to 7 days for moderate pain. -     ketorolac (TORADOL) 30 MG/ML injection 30 mg -     promethazine (PHENERGAN) injection 25 mg -     dexamethasone (DECADRON) injection 10 mg  Insomnia, unspecified type -     temazepam (RESTORIL)  7.5 MG capsule; Take one tablet at bedtime for sleep may increase to 2 tablets as needed.   One suture removal without complication  Toradol/Phenergan/decadron given for post concussion HA today .Marland KitchenPDMP reviewed during this encounter. Small quantity of norco as needed and to NOT take with xanax or restoril Discussed post concussion syndrome and timeline-HO given  Restoril for bedtime Xanax during the day  Continue plan from neurology  Continue wegovy for weight loss Make sure eating balanced meals    Tandy Gaw, PA-C

## 2022-09-01 IMAGING — MR MR LUMBAR SPINE W/O CM
4 of 5 series · 25 of 48 positions shown · non-contrast
Comparison: Lumbar MRI 06/27/2014.

CLINICAL DATA: 40-year-old female with low back pain on the left
radiating down the left leg after fall on stairs earlier this month.
Coccyx contusion.

EXAM:
MRI LUMBAR SPINE WITHOUT CONTRAST
TECHNIQUE: Multiplanar, multisequence MR imaging of the lumbar spine was
performed. No intravenous contrast was administered.

[Series 2: T2 · sagittal · 4.0mm · 0.81mm/px · 6 of 15 slices shown (1 of 2)]
[im 1/15]
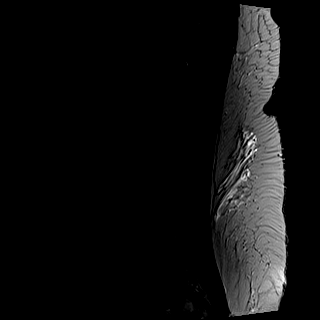
[im 3/15]
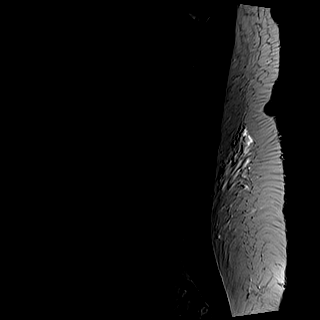
[im 6/15]
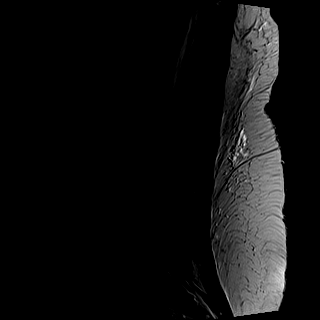
[im 9/15]
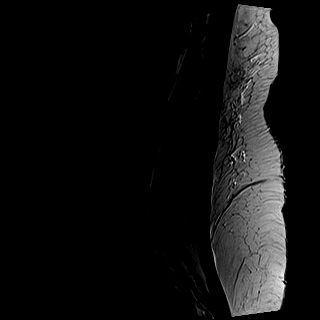
[im 12/15]
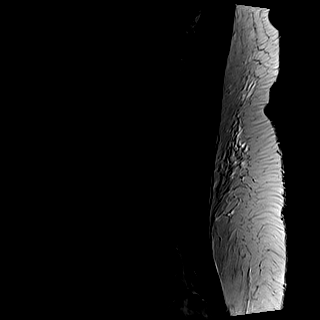
[im 15/15]
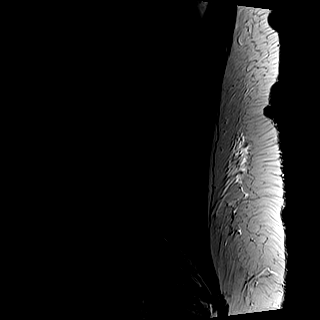

[Series 3: T1 · sagittal · 4.0mm · 0.41mm/px · 6 of 15 slices shown (1 of 2)]
[im 1/15]
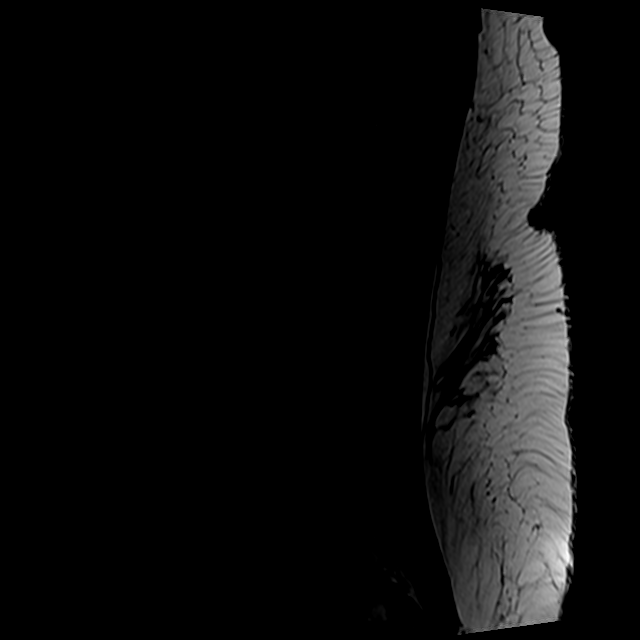
[im 3/15]
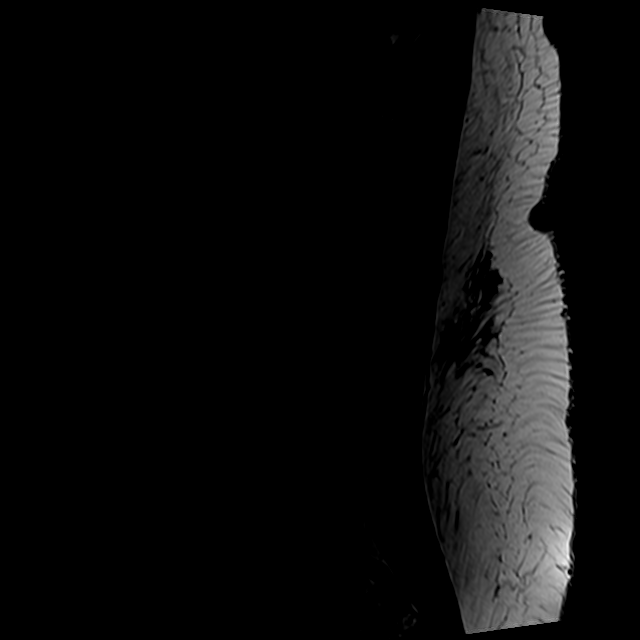
[im 6/15]
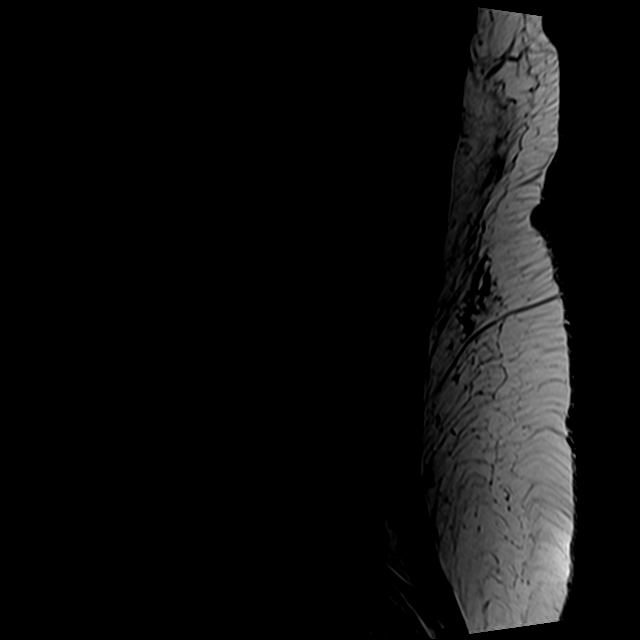
[im 9/15]
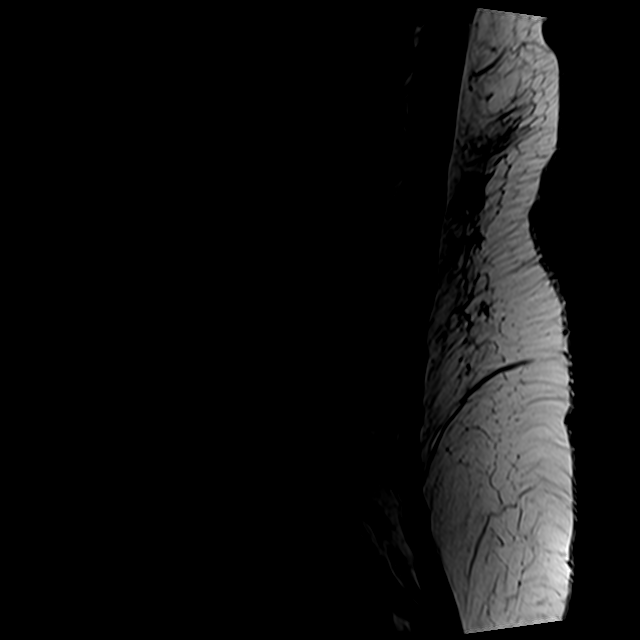
[im 12/15]
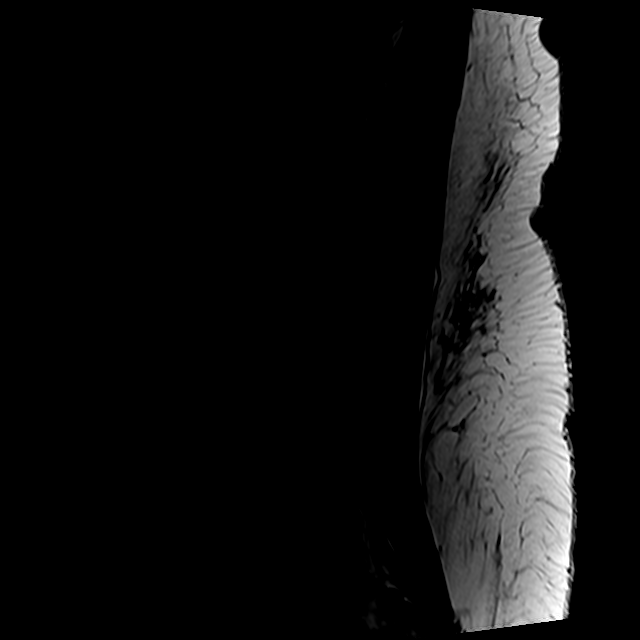
[im 15/15]
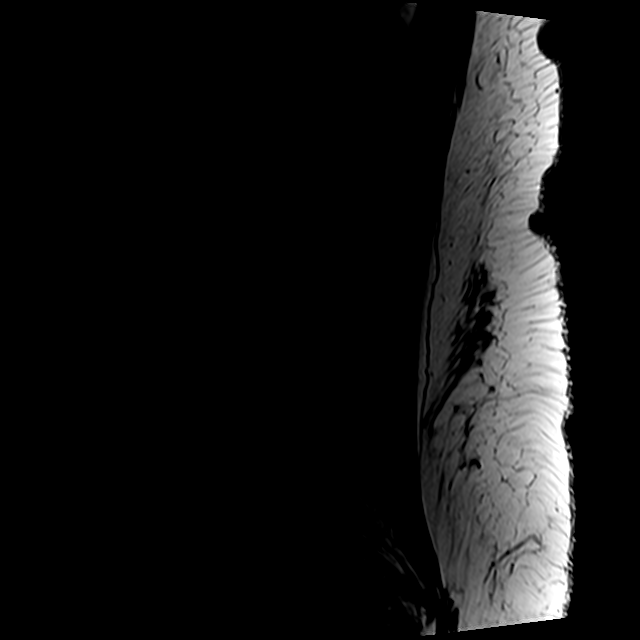

[Series 5: T2 · axial · 4.0mm · 0.78mm/px · z∈[-159,+49]mm · 9 of 38 slices shown (2 of 2)]
[im 1/38]
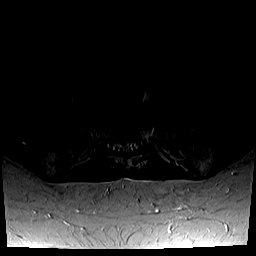
[im 6/38]
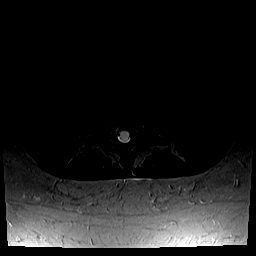
[im 11/38]
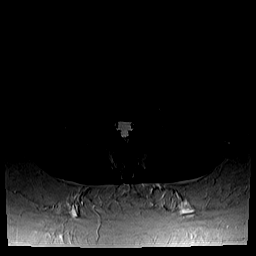
[im 16/38]
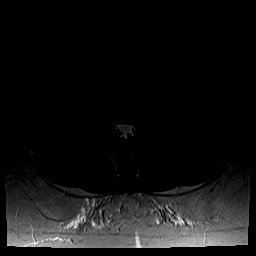
[im 19/38]
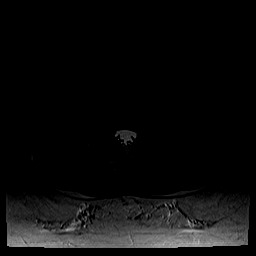
[im 22/38]
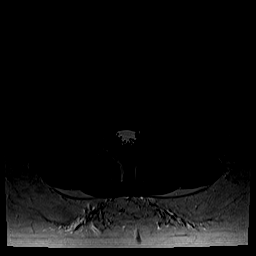
[im 27/38]
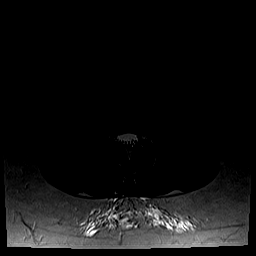
[im 32/38]
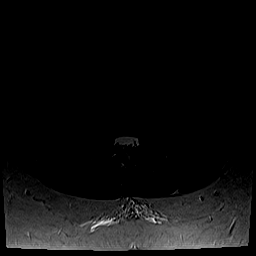
[im 38/38]
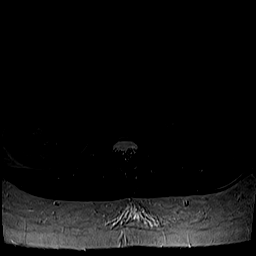

[Series 6: T1 · axial · 4.0mm · 0.39mm/px · z∈[-159,+20]mm · 4 of 38 slices shown (2 of 2)]
[im 1/38]
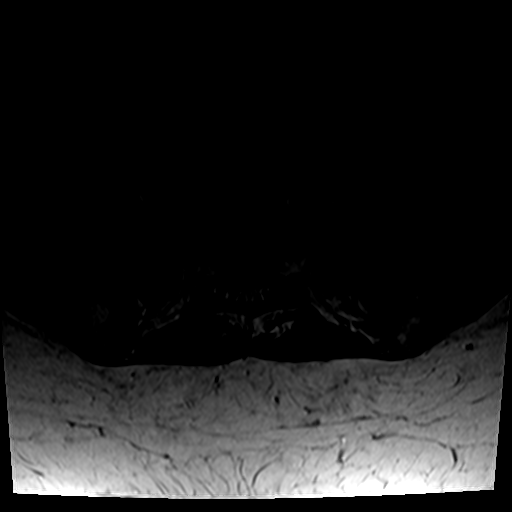
[im 6/38]
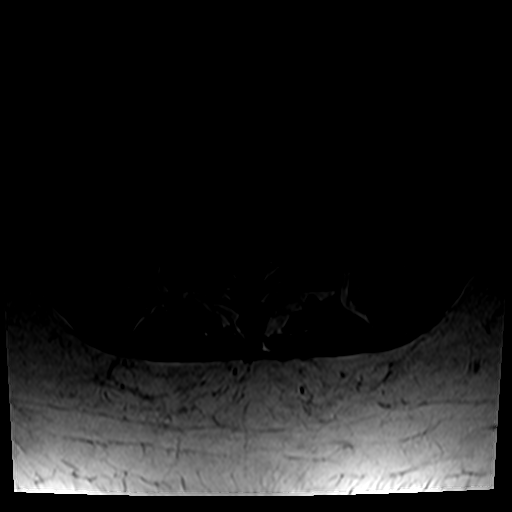
[im 19/38]
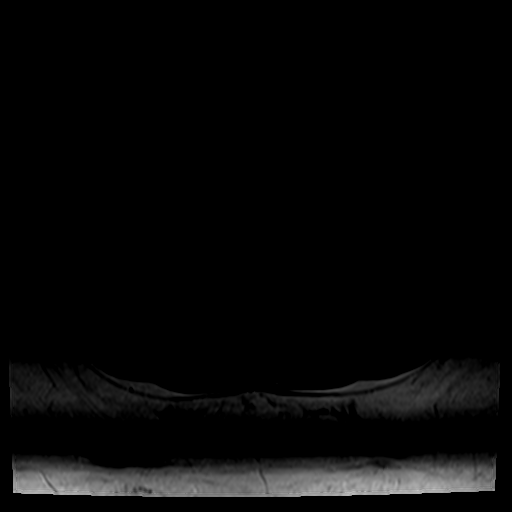
[im 32/38]
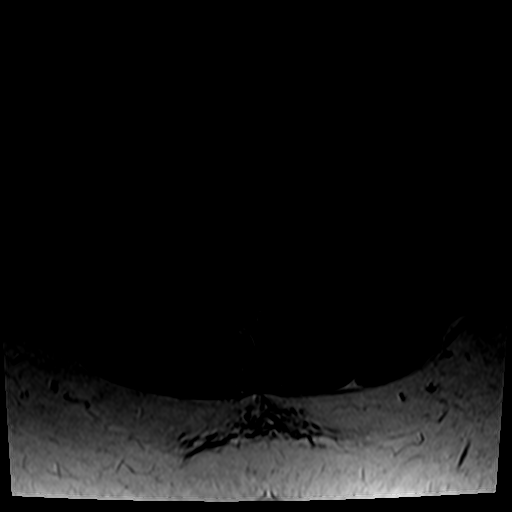

[25 of 48 positions shown; findings below may reference images not displayed]

FINDINGS: Segmentation: Lumbar segmentation appears to be normal, the same
numbering system used in 8152.

Alignment: Stable lumbar lordosis. Subtle chronic retrolisthesis of
L5 on S1.

Vertebrae: Chronic degenerative endplate marrow signal changes at
L5-S1. Normal background bone marrow signal. No marrow edema or
evidence of acute osseous abnormality. Intact visible sacrum and SI
joints. Lower sacrum and coccyx not included.

Conus medullaris and cauda equina: Conus extends to the T12-L1
level. No lower spinal cord or conus signal abnormality.

Paraspinal and other soft tissues: Negative.

Disc levels:

T11-T12: Mild anterior disc bulging but otherwise negative.

T12-L1:  Negative.

L1-L2: Negative disc. Mild facet hypertrophy on the left. No
stenosis.

L2-L3:  Negative.

L3-L4:  Negative.

L4-L5: Subtle disc desiccation is new since 8152, and there is mild
leftward disc bulging (series 2, image 9), most affecting the left
lateral recess with mild new lateral recess stenosis at the
descending left L5 nerve level. Borderline to mild facet
hypertrophy. No spinal stenosis. Mild left L4 foraminal stenosis
also.

L5-S1: Chronic disc desiccation. Mild disc space loss since 8152.
Chronic circumferential disc osteophyte complex has increased. No
spinal or lateral recess stenosis. No convincing foraminal stenosis.
IMPRESSION: 1. Mild degeneration of the L4-L5 disc since 8152 with left
subarticular disc bulging resulting in up to mild left foraminal and
lateral recess stenosis. Query left L4 and/or L5 radiculitis. No
spinal stenosis.
2. Chronic disc and endplate degeneration at L5-S1 with some
progression since 8152 but no convincing stenosis.

## 2022-09-02 ENCOUNTER — Encounter: Payer: Self-pay | Admitting: Physician Assistant

## 2022-09-02 DIAGNOSIS — S0101XA Laceration without foreign body of scalp, initial encounter: Secondary | ICD-10-CM | POA: Insufficient documentation

## 2022-09-02 DIAGNOSIS — F0781 Postconcussional syndrome: Secondary | ICD-10-CM | POA: Insufficient documentation

## 2022-09-05 ENCOUNTER — Encounter: Payer: Self-pay | Admitting: Neurology

## 2022-09-05 ENCOUNTER — Other Ambulatory Visit: Payer: Self-pay | Admitting: Physician Assistant

## 2022-09-05 DIAGNOSIS — F0781 Postconcussional syndrome: Secondary | ICD-10-CM

## 2022-09-06 ENCOUNTER — Encounter: Payer: Self-pay | Admitting: Physician Assistant

## 2022-09-06 ENCOUNTER — Other Ambulatory Visit: Payer: Self-pay | Admitting: Physician Assistant

## 2022-09-06 DIAGNOSIS — G43911 Migraine, unspecified, intractable, with status migrainosus: Secondary | ICD-10-CM

## 2022-09-06 DIAGNOSIS — F0781 Postconcussional syndrome: Secondary | ICD-10-CM

## 2022-09-10 MED ORDER — CYCLOBENZAPRINE HCL 10 MG PO TABS: 10.0000 mg | ORAL_TABLET | Freq: Three times a day (TID) | ORAL | 0 refills | Status: DC | PRN

## 2022-09-10 NOTE — Telephone Encounter (Signed)
I think we should wait till Deanna Keller comes back on this 1.  We do not typically use hydrocodone for headaches so I do not think that is a good long-term plan.  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  Does patient have any type of follow-up such as neurology etc.?   Meds ordered this encounter  Medications   cyclobenzaprine (FLEXERIL) 10 MG tablet    Sig: Take 1 tablet (10 mg total) by mouth 3 (three) times daily as needed.    Dispense:  45 tablet    Refill:  0

## 2022-09-16 ENCOUNTER — Encounter: Payer: Self-pay | Admitting: Neurology

## 2022-09-20 ENCOUNTER — Ambulatory Visit (INDEPENDENT_AMBULATORY_CARE_PROVIDER_SITE_OTHER): Payer: BC Managed Care – PPO | Admitting: Physician Assistant

## 2022-09-20 ENCOUNTER — Encounter: Payer: Self-pay | Admitting: Physician Assistant

## 2022-09-20 VITALS — BP 103/70 | HR 101 | Ht 65.0 in | Wt 170.0 lb

## 2022-09-20 DIAGNOSIS — H65191 Other acute nonsuppurative otitis media, right ear: Secondary | ICD-10-CM

## 2022-09-20 DIAGNOSIS — R569 Unspecified convulsions: Secondary | ICD-10-CM

## 2022-09-20 DIAGNOSIS — F0781 Postconcussional syndrome: Secondary | ICD-10-CM | POA: Diagnosis not present

## 2022-09-20 DIAGNOSIS — G43911 Migraine, unspecified, intractable, with status migrainosus: Secondary | ICD-10-CM

## 2022-09-20 DIAGNOSIS — F411 Generalized anxiety disorder: Secondary | ICD-10-CM | POA: Diagnosis not present

## 2022-09-20 MED ORDER — HYDROCODONE-ACETAMINOPHEN 5-325 MG PO TABS: 1.0000 | ORAL_TABLET | Freq: Three times a day (TID) | ORAL | 0 refills | Status: DC | PRN

## 2022-09-20 MED ORDER — ALPRAZOLAM 1 MG PO TABS
1.0000 mg | ORAL_TABLET | Freq: Three times a day (TID) | ORAL | 1 refills | Status: DC | PRN
Start: 2022-09-20 — End: 2022-11-08

## 2022-09-20 MED ORDER — DEXAMETHASONE SODIUM PHOSPHATE 10 MG/ML IJ SOLN
10.0000 mg | Freq: Once | INTRAMUSCULAR | Status: AC
Start: 2022-09-20 — End: 2022-09-20
  Administered 2022-09-20: 10 mg via INTRAMUSCULAR

## 2022-09-20 MED ORDER — KETOROLAC TROMETHAMINE 60 MG/2ML IM SOLN
60.0000 mg | Freq: Once | INTRAMUSCULAR | Status: AC
Start: 2022-09-20 — End: 2022-09-20
  Administered 2022-09-20: 60 mg via INTRAMUSCULAR

## 2022-09-20 NOTE — Patient Instructions (Signed)
Norco as needed for break through pain  Get in with neurology  Post-Concussion Syndrome  A concussion is a brain injury from a direct hit to the head or body. This hit causes the brain to shake back and forth fast inside the skull. The shaking can damage brain cells and cause chemical changes in the brain. Concussions are normally not life-threatening but can cause serious symptoms. Post-concussion syndrome is when symptoms that happen after a concussion last longer than normal. These symptoms can last from weeks to months. What are the causes? The cause of this condition is not known. It can happen whether your head injury was mild or severe. What increases the risk? You are more likely to get this condition if: You are female. You are a child, teen, or young adult. You have had a head injury before. You have a history of headaches. You have depression or anxiety. You have more than one symptom or severe symptoms when your concussion occurs. You faint or cannot remember the event (have amnesia of the event). What are the signs or symptoms? Symptoms of this condition include: Physical symptoms, such as: Headaches. Tiredness. Dizziness and weakness. Blurred vision and sensitivity to light. Trouble hearing. Problems with balance. Mental and emotional symptoms, such as: Memory problems and trouble focusing. Trouble falling asleep or staying asleep (insomnia). Feeling irritable. Anxiety or depression. Trouble learning new things. How is this diagnosed? This condition may be diagnosed based on: Your symptoms. A description of your injury. Your medical history. Testing your strength, balance, and nerve function (neurological exam). Your health care provider may order other tests. These may include: Brain imaging, such as a CT scan or an MRI. Memory testing (neuropsychological testing). How is this treated? Treatment for this condition may depend on your symptoms. Symptoms  normally go away on their own with time. If you need treatment, it may include: Medicines for headaches, anxiety, depression, and insomnia. Resting your brain and body for a few days after your injury. Rehab therapy, such as: Physical or occupational therapy. This may include exercises to help with balance and dizziness. Mental health counseling. A form of talk therapy called cognitive behavioral therapy (CBT) can be especially helpful. This therapy helps you set goals and follow up on the changes that you make. Speech therapy. Vision therapy. A brain and eye specialist can recommend treatments for vision problems. Follow these instructions at home: Medicines Take over-the-counter and prescription medicines only as told by your health care provider. Avoid opioid prescription pain medicines when getting over a concussion. Activity Limit your mental activities for the first few days after your injury. This may include not doing these things: Homework or work for your job. Complex thinking. Watching TV. Using a computer or phone. Playing memory games and puzzles. Slowly return to your normal activity level. If a certain activity brings on your symptoms, stop or slow down until you can do the activity without getting symptoms again. Limit physical activity, such as sports or strenuous activities, for the first few days after a concussion. Slowly return to normal activity as told by your health care provider. Light exercise may be helpful. Rest helps your brain heal. Make sure you: Get plenty of sleep at night. Most adults should get at least 7-9 hours of sleep each night. Rest during the day. Take naps or rest breaks when you feel tired. Do not do high-risk activities that could cause a second concussion, such as riding a bike or playing sports. Having another concussion  before the first one has healed can be harmful. General instructions  Do not drink alcohol until your health care provider  says that you can. Keep track of how severe your symptoms are and how often they happen. Give this information to your health care provider. Keep all follow-up visits. Your health care provider may need to check you for new or serious symptoms. Where to find more information [employer]: concussionfoundation.org Contact a health care provider if: Your symptoms do not improve. You get injured again. You have unusual behavior changes. Get help right away if: You have a severe or worsening headache. You have weakness or numbness in any part of your body. You vomit repeatedly. You have mental status changes, such as: Confusion. Trouble speaking. Trouble staying awake. Fainting. You have a seizure. These symptoms may be an emergency. Get help right away. Call 911. Do not wait to see if the symptoms will go away. Do not drive yourself to the hospital. Also, get help right away if: You think about hurting yourself or others. Take one of these steps if you feel like you may hurt yourself or others, or have thoughts about taking your own life: Go to your nearest emergency room. Call 911. Call the National Suicide Prevention Lifeline at 226-596-8378 or 988. This is open 24 hours a day. Text the Crisis Text Line at 854-519-7611. This information is not intended to replace advice given to you by your health care provider. Make sure you discuss any questions you have with your health care provider. Document Revised: 08/24/2021 Document Reviewed: 08/24/2021 Elsevier Patient Education  2024 [employer].

## 2022-09-20 NOTE — Progress Notes (Unsigned)
Acute Office Visit  Subjective:     Patient ID: Ndidi Nesby, female    DOB: Jun 08, 1980, 42 y.o.   MRN: [id number]  Chief Complaint  Patient presents with   Migraine    Patient presents with worsening migraine over the last 3 days. No known trigger or associated aura. Pain described as "goggles tightened over my head" with pulsations. 10/10 and worse on L side. Ibuprofen, Tylenol, and flexeril have been ineffective. Associated symptoms include dizziness, photophobia, phonophobia, tinnitus, ear fullness, and n/v. Patient denies fever, significant neck pain, and one sided weakness.   Migraine  Associated symptoms include dizziness, ear pain, nausea, photophobia, tinnitus and vomiting. Pertinent negatives include no blurred vision, fever, neck pain, seizures, tingling or weakness.   Pt does mention she has been out of xanax for 3 days and worried about triggering another seizure. Needs refills.  Review of Systems  Constitutional:  Negative for fever.  HENT:  Positive for ear pain and tinnitus.   Eyes:  Positive for photophobia. Negative for blurred vision and double vision.  Gastrointestinal:  Positive for nausea and vomiting.  Musculoskeletal:  Negative for neck pain.  Neurological:  Positive for dizziness and headaches. Negative for tingling, sensory change, speech change, focal weakness, seizures and weakness.        Objective:    BP 103/70 (BP Location: Left Arm, Patient Position: Sitting, Cuff Size: Normal)   Pulse (!) 101   Ht 5\' 5"  (1.651 m)   Wt 170 lb (77.1 kg)   SpO2 99%   BMI 28.29 kg/m  BP Readings from Last 3 Encounters:  09/20/22 103/70  08/30/22 104/60  07/17/22 119/74   Wt Readings from Last 3 Encounters:  09/20/22 170 lb (77.1 kg)  08/30/22 165 lb (74.8 kg)  07/22/22 174 lb (78.9 kg)      Physical Exam HENT:     Head: Normocephalic and atraumatic.     Ears:     Comments: Right ear with significant effusion Eyes:     Extraocular Movements:  Extraocular movements intact.     Conjunctiva/sclera: Conjunctivae normal.     Pupils: Pupils are equal, round, and reactive to light.     Comments: No nystagmus. EOM intact but slightly delayed b/l  Neurological:     General: No focal deficit present.     Mental Status: She is alert and oriented to person, place, and time.     Cranial Nerves: No cranial nerve deficit.     Sensory: No sensory deficit.     Motor: No weakness.     Coordination: Coordination normal.     Gait: Gait normal.         Assessment & Plan:  Marland [name] KitchenRoya was seen today for migraine.  Diagnoses and all orders for this visit:  Intractable migraine with status migrainosus, unspecified migraine type -     HYDROcodone-acetaminophen (NORCO/VICODIN) 5-325 MG tablet; Take 1 tablet by mouth every 8 (eight) hours as needed for up to 5 days for moderate pain. -     ketorolac (TORADOL) injection 60 mg -     dexamethasone (DECADRON) injection 10 mg  Generalized anxiety disorder -     ALPRAZolam (XANAX) 1 MG tablet; Take 1 tablet (1 mg total) by mouth 3 (three) times daily as needed for anxiety.  Seizure (HCC) -     ALPRAZolam (XANAX) 1 MG tablet; Take 1 tablet (1 mg total) by mouth 3 (three) times daily as needed for anxiety.  Post concussion syndrome -  HYDROcodone-acetaminophen (NORCO/VICODIN) 5-325 MG tablet; Take 1 tablet by mouth every 8 (eight) hours as needed for up to 5 days for moderate pain.  Acute effusion of right ear -     dexamethasone (DECADRON) injection 10 mg  Other orders -     Discontinue: HYDROcodone-acetaminophen (NORCO/VICODIN) 5-325 MG tablet; Take 1 tablet by mouth every 8 (eight) hours as needed for up to 5 days for moderate pain.   Pt instructed to follow up sooner with neurology for migraines/post concussion headaches. I do not want to make changes to prevention medication.  ? If some of headache is withdrawal from xanax vs post concussion vs right ear effusion vs known migraines Toradol  and decadron IM given in office today .Marland KitchenPDMP reviewed during this encounter. No concerns small quantity of norco given for rescue Refilled xanax early but to continue to take BID and very rarely TID.

## 2022-09-23 ENCOUNTER — Encounter: Payer: Self-pay | Admitting: Physician Assistant

## 2022-09-23 DIAGNOSIS — G43911 Migraine, unspecified, intractable, with status migrainosus: Secondary | ICD-10-CM | POA: Insufficient documentation

## 2022-09-23 DIAGNOSIS — H65191 Other acute nonsuppurative otitis media, right ear: Secondary | ICD-10-CM | POA: Insufficient documentation

## 2022-09-25 ENCOUNTER — Encounter: Payer: Self-pay | Admitting: Neurology

## 2022-09-25 NOTE — Telephone Encounter (Signed)
Lets change it to virtual.

## 2022-09-26 ENCOUNTER — Telehealth (INDEPENDENT_AMBULATORY_CARE_PROVIDER_SITE_OTHER): Payer: BC Managed Care – PPO | Admitting: Neurology

## 2022-09-26 ENCOUNTER — Encounter: Payer: Self-pay | Admitting: Neurology

## 2022-09-26 DIAGNOSIS — F419 Anxiety disorder, unspecified: Secondary | ICD-10-CM

## 2022-09-26 DIAGNOSIS — G44221 Chronic tension-type headache, intractable: Secondary | ICD-10-CM

## 2022-09-26 DIAGNOSIS — G40909 Epilepsy, unspecified, not intractable, without status epilepticus: Secondary | ICD-10-CM | POA: Diagnosis not present

## 2022-09-26 DIAGNOSIS — F0781 Postconcussional syndrome: Secondary | ICD-10-CM

## 2022-09-26 DIAGNOSIS — F32A Depression, unspecified: Secondary | ICD-10-CM

## 2022-09-26 MED ORDER — METHYLPREDNISOLONE 4 MG PO TBPK: ORAL_TABLET | ORAL | 0 refills | Status: AC

## 2022-09-26 MED ORDER — BUTALBITAL-APAP-CAFFEINE 50-325-40 MG PO TABS: 1.0000 | ORAL_TABLET | Freq: Four times a day (QID) | ORAL | 0 refills | Status: DC | PRN

## 2022-09-26 NOTE — Progress Notes (Signed)
PATIENT: Deanna Keller DOB: 01/08/1981  REASON FOR VISIT: follow up HISTORY FROM: patient PRIMARY NEUROLOGIST: Dr. Teresa Coombs  Chief Complaint  Patient presents with   Seizures   Headache    Follow up     HISTORY OF PRESENT ILLNESS: Today 09/26/22: Patient called today for follow-up, she is at home.  Last visit was in May and at that time we planned to discontinue Keppra and topiramate and start the patient on zonisamide with her lamotrigine.  Currently she is on lamotrigine XR 400 mg daily and zonisamide 300 mg at bedtime.  She has not had any seizure or seizure-like DVT.   Currently, her main complaint is her headaches.  She did report the headaches are band like pressure type pain, she is currently on Flexeril, Tylenol and ibuprofen.  Reports medications take the edge off but headaches are still present.  She also complains of ringing in the ear. Denies any new falls. She did follow up with her PCP and she is back on Alprazolam.    INTERVAL HISTORY 08/23/2022:  Patient was called today for video visit, last visit was on April 3.  At that time due to side effect of tremor plan was to decrease her Topamax.  Again the Topamax was used for appetite suppression.  She reports going from Topamax 100 mg twice daily to 50 mg twice daily and had a breakthrough seizure on March 8.  She was mowing her lawn and she was found between the pavement and the street by her neighbor.  She hit her head sustaining a laceration.  She did have a generalized tonic-clonic seizure.  In the ED she did have a few staples for laceration, her head CT was negative for any acute abnormality and she was discharged home with Keppra 500 mg twice daily.  Since being home she has not had any seizure, she is compliant with the medication but reported increased anxiety and fear of having another seizure and she did have pressure like headaches last night    INTERVAL HISTORY 07/17/2022:  Deanna Keller presents today for follow-up, last  visit was in October, since then, she has not had any seizures.  She is doing well on lamotrigine XR 400 mg daily.  On top of that she is also on Topamax for appetite suppression but the Topamax is giving her tremors.  She still struggling with her depression and following with psychiatry, she is on venlafaxine and fluoxetine.  Again no seizure or seizure-like activity. She is off Gabapentin   INTERVAL HISTORY 01/30/2022: Patient presents today for follow-up, last visit was in April, at that time we discontinued her gabapentin.  She tolerated the procedure well.  She is currently on Lamotrigine 400 mg daily. Denies any seizure or seizure-like activity.  She feels she is more depressed than usual, currently she is separated and taking care of her son.  She is not wanting to do anything, does not want to go to work, stated after dropping her son to school in the morning she is just sitting on the couch all day.  She did follow-up with up with a psychiatrist who wanted to start her on Wellbutrin but due to her seizure disorder she did not want to start the Wellbutrin and has not seen the psychiatrist again for follow up.  Currently she is on Effexor and xanax.  She reported tried in the past Zoloft, Cymbalta, Lexapro and other antidepressants but she does not remember the name.   INTERVAL HISTORY 08/09/2021:  Deanna Keller is a 42 year old female with a history of seizures.  She returns today for follow-up.  She is currently on Lamictal XR 400 mg twice a day.  She denies any seizure events.  Reports that last seizure was when she decrease her dose of Lamictal.  The patient states that she is on gabapentin 600 mg 3 times a day.  She reports that she has gained weight on this medication and would like it discontinued.  She would like our office to instruct her on the weaning process as she is very fearful of having a breakthrough seizure.  She was also recently placed on Topamax 100 mg twice a day as an appetite  suppressant.  Patient acknowledges when she was on Lamictal in the past she did not have any breakthrough seizures until she was instructed to decrease her dose.  HISTORY ( copied from Dr. Teresa Coombs) Patient presents today for follow-up, last visit was on June 2022. At that time she reported 2 additional seizures after her lamotrigine was decreased from 400 mg a day to 300 mg a day.  Since then her lamotrigine has been increased back to 400 mg daily.  Since her last visit she denies any additional seizures.  She reported that her mood is better, sleep is better, she cut down on her caffeine and cut down on her work.  Currently she does not have any complaint or concern but just wanted to know the level of Lamictal and gabapentin.  Last seizure was May 2022     Initial history from Dr Anne Hahn:  Deanna Keller is a 42 year old right-handed white female with a history of seizures 4 years ago while taking Ultram.  She had 2 seizures at that time, she was placed on Lamictal, she claims she had an EEG study and she believes she had an MRI but she is not sure, the records support that she had a noncontrast CT scan of the head for a concussion in 2013.  The patient was placed on Lamictal, she has been on 300 mg a day of this medication since that time but she does not always take the medication properly, she misses doses frequently.  She was placed on Wellbutrin for weight loss, 8 days ago she suffered another seizure, she was in the car with her husband who was driving and the patient had a witnessed event.  The patient started with a confusional event, she was talking nonsense and then went into a stiffening event, she did not bite her tongue or lose control of bowels or the bladder.  The event lasted about 1 minute.  The patient had no recollection of events during that time.  She has been increased on the Lamictal taking 200 mg twice daily.  She was taken off of the Wellbutrin.  She is sent to this office for an  evaluation.  She denies any headaches, dizziness, vision changes, or any numbness or weakness of the face, arms, legs.  She denies any balance problems.  She does have a prior history of alcohol abuse but she has not had anything to drink in 8 years.  She denies any illicit drug use such as cocaine or marijuana.  She does use Xanax in low-dose, taking 0.5 mg twice daily.  The patient does report chronic fatigue issues and daytime drowsiness.     Handedness: Right handed    Seizure Type: passing out, being stiff, clinched teeth, unconscious    Current frequency: Last seizure in May 2022  Any injuries from seizures: None    Seizure risk factors: None    Previous ASMs: Lamotrigine, Gabapentin, Topiramate    Currenty ASMs: Lamotrigine XR 400 mg daily, Zonisamide 300 mg nightly    ASMs side effects: None    Brain Images: Nonspecific white matter hyperintensities   Previous EEGs: Normal routine EEG     OTHER MEDICAL CONDITIONS: Anxiety/Depression, Seizure, Gastric bypass surgery   REVIEW OF SYSTEMS: Out of a complete 14 system review of symptoms, the patient complains only of the following symptoms, and all other reviewed systems are negative.  ALLERGIES: Allergies  Allergen Reactions   Augmentin [Amoxicillin-Pot Clavulanate] Nausea And Vomiting   Tramadol Other (See Comments)    Possible seizure   Wellbutrin [Bupropion]     Seizure   Phentermine     Increases seizure risk    HOME MEDICATIONS: Outpatient Medications Prior to Visit  Medication Sig Dispense Refill   ALPRAZolam (XANAX) 1 MG tablet Take 1 tablet (1 mg total) by mouth 3 (three) times daily as needed for anxiety. 70 tablet 1   AMBULATORY NON FORMULARY MEDICATION Semaglutide 2.5mg  with pyridoxine 10mg  per mL  Inject .6mg (24units) subcutaneously once weekly for 4 weeks then increase to next dose 2 mL 0   AMBULATORY NON FORMULARY MEDICATION Semaglutide 2.5mg  with pyridoxine 10mg  per mL Inject 1.2mg (48units) once  weekly 2 mL 1   BD INSULIN SYRINGE U/F 31G X 5/16" 1 ML MISC USE ONCE WITH VITAMIN B12 INJECTIONS 100 each 12   Cyanocobalamin 1000 MCG/ML KIT Inject 1 mL every two weeks. 20 kit 2   cyclobenzaprine (FLEXERIL) 10 MG tablet Take 1 tablet (10 mg total) by mouth 3 (three) times daily as needed. 45 tablet 0   FLUoxetine (PROZAC) 20 MG tablet TAKE 1 TABLET BY MOUTH EVERY DAY 90 tablet 1   Insulin Pen Needle 31G X 6 MM MISC Use once with vitamin B12 injections 100 each 0   LamoTRIgine 200 MG TB24 24 hour tablet TAKE 2 TABLETS BY MOUTH EVERY DAY 180 tablet 1   venlafaxine XR (EFFEXOR-XR) 150 MG 24 hr capsule TAKE 1 CAPSULE BY MOUTH DAILY WITH BREAKFAST. 90 capsule 1   zonisamide (ZONEGRAN) 100 MG capsule Take 3 capsules (300 mg total) by mouth at bedtime. 90 capsule 5   No facility-administered medications prior to visit.    PAST MEDICAL HISTORY: Past Medical History:  Diagnosis Date   Alcohol abuse    sober since 2011   Anxiety    Contusion    tail bone contusion in Nov 2022   Seizure disorder (HCC) 12/28/2018    PAST SURGICAL HISTORY: Past Surgical History:  Procedure Laterality Date   CERVICAL CONE BIOPSY     GASTRIC BYPASS  2005    FAMILY HISTORY: Family History  Problem Relation Age of Onset   Colon cancer Mother    Depression Mother    Hypertension Father     SOCIAL HISTORY: Social History   Socioeconomic History   Marital status: Legally Separated    Spouse name: Not on file   Number of children: Not on file   Years of education: Not on file   Highest education level: Bachelor's degree (e.g., BA, AB, BS)  Occupational History   Not on file  Tobacco Use   Smoking status: Former    Packs/day: 0.50    Years: 10.00    Additional pack years: 0.00    Total pack years: 5.00    Types: E-cigarettes, Cigarettes    Start date:  07/21/2012   Smokeless tobacco: Current   Tobacco comments:    uses nicotine pouches sometimes   Vaping Use   Vaping Use: Never used   Substance and Sexual Activity   Alcohol use: No   Drug use: No   Sexual activity: Yes    Partners: Male  Other Topics Concern   Not on file  Social History Narrative   Right handed   Caffeine maybe 1 cup/day   Lives at home with husband and 52 year old son    Social Determinants of Health   Financial Resource Strain: Low Risk  (07/22/2022)   Overall Financial Resource Strain (CARDIA)    Difficulty of Paying Living Expenses: Not very hard  Food Insecurity: No Food Insecurity (07/22/2022)   Hunger Vital Sign    Worried About Running Out of Food in the Last Year: Never true    Ran Out of Food in the Last Year: Never true  Transportation Needs: No Transportation Needs (07/22/2022)   PRAPARE - Administrator, Civil Service (Medical): No    Lack of Transportation (Non-Medical): No  Physical Activity: Insufficiently Active (07/22/2022)   Exercise Vital Sign    Days of Exercise per Week: 1 day    Minutes of Exercise per Session: 20 min  Stress: Stress Concern Present (07/22/2022)   [employer] of Occupational Health - Occupational Stress Questionnaire    Feeling of Stress : To some extent  Social Connections: Moderately Integrated (07/22/2022)   Social Connection and Isolation Panel [NHANES]    Frequency of Communication with Friends and Family: More than three times a week    Frequency of Social Gatherings with Friends and Family: More than three times a week    Attends Religious Services: More than 4 times per year    Active Member of [employer] or Organizations: Yes    Attends Engineer, structural: More than 4 times per year    Marital Status: Separated  Intimate Partner Violence: Not on file      PHYSICAL EXAM  There were no vitals filed for this visit.    There is no height or weight on file to calculate BMI.  Generalized: Well developed, in no acute distress but appears anxious and tremulous.   Mentation: Alert oriented to time, place, history taking.  Follows all commands speech and language fluent     DIAGNOSTIC DATA (LABS, IMAGING, TESTING) - I reviewed patient records, labs, notes, testing and imaging myself where available.  Lab Results  Component Value Date   WBC 4.2 08/08/2021   HGB 12.2 08/08/2021   HCT 37.3 08/08/2021   MCV 87 08/08/2021   PLT 364 08/08/2021      Component Value Date/Time   NA 138 11/28/2021 0000   NA 140 08/08/2021 1106   K 5.0 11/28/2021 0000   CL 107 11/28/2021 0000   CO2 23 11/28/2021 0000   GLUCOSE 96 11/28/2021 0000   BUN 11 11/28/2021 0000   BUN 14 08/08/2021 1106   CREATININE 0.82 11/28/2021 0000   CALCIUM 8.9 11/28/2021 0000   CALCIUM 9.0 04/19/2013 0000   PROT 6.7 11/28/2021 0000   PROT 7.1 08/08/2021 1106   ALBUMIN 4.4 08/08/2021 1106   ALBUMIN 3.8 04/19/2013 0000   AST 29 11/28/2021 0000   ALT 31 (H) 11/28/2021 0000   ALKPHOS 83 08/08/2021 1106   BILITOT 0.2 11/28/2021 0000   BILITOT 0.3 08/08/2021 1106   GFRNONAA 109 04/05/2020 0711   GFRAA 126 04/05/2020 0711  Lab Results  Component Value Date   CHOL 205 (H) 02/20/2021   HDL 92 02/20/2021   LDLCALC 100 (H) 02/20/2021   TRIG 45 02/20/2021   CHOLHDL 2.2 02/20/2021   Lab Results  Component Value Date   HGBA1C 4.9 02/20/2021   Lab Results  Component Value Date   VITAMINB12 962 02/20/2021   Lab Results  Component Value Date   TSH 1.11 11/28/2021    Head CT 08/21/2022 Mild left lateral posterior parietal scalp soft tissue swelling with subcutaneous emphysema without underlying calvarial fracture.  No acute intracranial abnormality.    ASSESSMENT AND PLAN 42 y.o. year old female  has a past medical history of Alcohol abuse, Anxiety, Contusion, and Seizure disorder (HCC) (12/28/2018). here with:  1.  Epilepsy 2.  Depression  3.  Anxiety  Continue Lamictal XR 400 mg daily Continue with Venlafaxine and Fluoxetine. Please talk to your psychiatrist prior to making any changes to these medications.  Continue with  Zonisamide 300 mg nightly Consider acupuncture and massage therapy for the tension headaches Trial of medrol dose pack for the headaches Trial of Fioricet (Only 10 tablets) for the headaches  Follow-up in 6 months or sooner if needed   Virtual Visit via Video Note  I connected with  Deanna Keller on 09/26/22 at  8:45 AM EDT by a video enabled telemedicine application and verified that I am speaking with the correct person using two identifiers.  Location: Patient: Home Provider: [facility] Office   I discussed the limitations of evaluation and management by telemedicine and the availability of in person appointments. The patient expressed understanding and agreed to proceed.   I discussed the assessment and treatment plan with the patient. The patient was provided an opportunity to ask questions and all were answered. The patient agreed with the plan and demonstrated an understanding of the instructions.   The patient was advised to call back or seek an in-person evaluation if the symptoms worsen or if the condition fails to improve as anticipated.  I provided 30 minutes of non-face-to-face time during this encounter.    Windell Norfolk, MD 09/26/2022, 9:13 AM [facility] [address] [place], [place] [address] [phone]

## 2022-09-26 NOTE — Patient Instructions (Signed)
Continue Lamictal XR 400 mg daily Continue with Venlafaxine and Fluoxetine. Please talk to your psychiatrist prior to making any changes to these medications.  Continue with Zonisamide 300 mg nightly Consider acupuncture and massage therapy for the tension headaches Trial of medrol dose pack for the headaches Trial of Fioricet (Only 10 tablets) for the headaches  Follow-up in 6 months or sooner if needed

## 2022-09-27 ENCOUNTER — Encounter: Payer: Self-pay | Admitting: Physician Assistant

## 2022-09-27 ENCOUNTER — Ambulatory Visit: Payer: BC Managed Care – PPO | Admitting: Physician Assistant

## 2022-09-27 DIAGNOSIS — Z8639 Personal history of other endocrine, nutritional and metabolic disease: Secondary | ICD-10-CM

## 2022-10-01 MED ORDER — AMBULATORY NON FORMULARY MEDICATION
2 refills | Status: DC
Start: 2022-10-01 — End: 2023-01-08

## 2022-10-01 NOTE — Addendum Note (Signed)
Addended by: Jomarie Longs on: 10/01/2022 04:01 PM   Modules accepted: Orders

## 2022-10-02 ENCOUNTER — Other Ambulatory Visit: Payer: Self-pay | Admitting: Family Medicine

## 2022-10-02 DIAGNOSIS — G43911 Migraine, unspecified, intractable, with status migrainosus: Secondary | ICD-10-CM

## 2022-10-08 ENCOUNTER — Telehealth: Payer: Self-pay

## 2022-10-08 DIAGNOSIS — Z6827 Body mass index (BMI) 27.0-27.9, adult: Secondary | ICD-10-CM | POA: Diagnosis not present

## 2022-10-08 DIAGNOSIS — F99 Mental disorder, not otherwise specified: Secondary | ICD-10-CM | POA: Diagnosis not present

## 2022-10-08 DIAGNOSIS — Z01419 Encounter for gynecological examination (general) (routine) without abnormal findings: Secondary | ICD-10-CM | POA: Diagnosis not present

## 2022-10-08 NOTE — Telephone Encounter (Signed)
Initiated Prior authorization NWG:[id number] 5-325MG  tablets Via: Covermymeds Case/Key:[id number]  Status: approved  as of 09/2522 Reason:this request is approved from 10/08/2022 to 04/06/2023. Notified Pt via: Mychart

## 2022-10-09 ENCOUNTER — Other Ambulatory Visit: Payer: Self-pay | Admitting: Physician Assistant

## 2022-10-09 DIAGNOSIS — G4700 Insomnia, unspecified: Secondary | ICD-10-CM

## 2022-10-11 ENCOUNTER — Encounter: Payer: Self-pay | Admitting: Physician Assistant

## 2022-10-11 DIAGNOSIS — G4700 Insomnia, unspecified: Secondary | ICD-10-CM

## 2022-10-15 MED ORDER — TEMAZEPAM 7.5 MG PO CAPS
ORAL_CAPSULE | ORAL | 2 refills | Status: DC
Start: 2022-10-15 — End: 2023-01-23

## 2022-10-23 ENCOUNTER — Other Ambulatory Visit: Payer: Self-pay | Admitting: Physician Assistant

## 2022-10-23 DIAGNOSIS — G43911 Migraine, unspecified, intractable, with status migrainosus: Secondary | ICD-10-CM

## 2022-10-28 MED ORDER — OZEMPIC (2 MG/DOSE) 8 MG/3ML ~~LOC~~ SOPN: 2.0000 mg | PEN_INJECTOR | SUBCUTANEOUS | 0 refills | Status: DC

## 2022-10-28 NOTE — Addendum Note (Signed)
Addended by: Jomarie Longs on: 10/28/2022 04:37 PM   Modules accepted: Orders

## 2022-11-08 ENCOUNTER — Encounter: Payer: Self-pay | Admitting: Physician Assistant

## 2022-11-08 DIAGNOSIS — F411 Generalized anxiety disorder: Secondary | ICD-10-CM

## 2022-11-08 DIAGNOSIS — R569 Unspecified convulsions: Secondary | ICD-10-CM

## 2022-11-08 MED ORDER — ALPRAZOLAM 1 MG PO TABS
1.0000 mg | ORAL_TABLET | Freq: Three times a day (TID) | ORAL | 2 refills | Status: DC | PRN
Start: 2022-11-08 — End: 2023-01-08

## 2022-11-16 ENCOUNTER — Other Ambulatory Visit: Payer: Self-pay | Admitting: Physician Assistant

## 2022-11-16 DIAGNOSIS — G43911 Migraine, unspecified, intractable, with status migrainosus: Secondary | ICD-10-CM

## 2022-12-09 ENCOUNTER — Other Ambulatory Visit: Payer: Self-pay | Admitting: Physician Assistant

## 2022-12-09 DIAGNOSIS — G43911 Migraine, unspecified, intractable, with status migrainosus: Secondary | ICD-10-CM

## 2022-12-12 ENCOUNTER — Other Ambulatory Visit: Payer: Self-pay | Admitting: Physician Assistant

## 2022-12-12 DIAGNOSIS — G43911 Migraine, unspecified, intractable, with status migrainosus: Secondary | ICD-10-CM

## 2022-12-12 NOTE — Telephone Encounter (Signed)
Requesting rx rf of cyclobenzaprine 10mg   Last written 11/19/2022 Last OV 09/26/2022 video visit Upcoming appt schld 03/31/2023

## 2022-12-13 MED ORDER — CYCLOBENZAPRINE HCL 10 MG PO TABS
10.0000 mg | ORAL_TABLET | Freq: Three times a day (TID) | ORAL | 0 refills | Status: DC | PRN
Start: 2022-12-13 — End: 2023-01-06

## 2023-01-02 ENCOUNTER — Other Ambulatory Visit: Payer: Self-pay | Admitting: Physician Assistant

## 2023-01-02 DIAGNOSIS — F411 Generalized anxiety disorder: Secondary | ICD-10-CM

## 2023-01-02 DIAGNOSIS — F331 Major depressive disorder, recurrent, moderate: Secondary | ICD-10-CM

## 2023-01-03 ENCOUNTER — Other Ambulatory Visit: Payer: Self-pay | Admitting: Physician Assistant

## 2023-01-03 DIAGNOSIS — G43911 Migraine, unspecified, intractable, with status migrainosus: Secondary | ICD-10-CM

## 2023-01-06 ENCOUNTER — Other Ambulatory Visit: Payer: Self-pay | Admitting: Physician Assistant

## 2023-01-06 ENCOUNTER — Encounter: Payer: Self-pay | Admitting: Physician Assistant

## 2023-01-06 DIAGNOSIS — R569 Unspecified convulsions: Secondary | ICD-10-CM

## 2023-01-06 DIAGNOSIS — G43911 Migraine, unspecified, intractable, with status migrainosus: Secondary | ICD-10-CM

## 2023-01-06 DIAGNOSIS — Z8639 Personal history of other endocrine, nutritional and metabolic disease: Secondary | ICD-10-CM

## 2023-01-06 DIAGNOSIS — F411 Generalized anxiety disorder: Secondary | ICD-10-CM

## 2023-01-07 MED ORDER — CYCLOBENZAPRINE HCL 10 MG PO TABS
10.0000 mg | ORAL_TABLET | Freq: Three times a day (TID) | ORAL | 1 refills | Status: DC | PRN
Start: 2023-01-07 — End: 2023-02-14

## 2023-01-08 MED ORDER — AMBULATORY NON FORMULARY MEDICATION
1 refills | Status: DC
Start: 2023-01-08 — End: 2023-03-28

## 2023-01-08 MED ORDER — ALPRAZOLAM 1 MG PO TABS: 1.0000 mg | ORAL_TABLET | Freq: Three times a day (TID) | ORAL | 2 refills | Status: DC | PRN

## 2023-01-08 NOTE — Addendum Note (Signed)
Addended by: Jomarie Longs on: 01/08/2023 04:20 PM   Modules accepted: Orders

## 2023-01-21 ENCOUNTER — Encounter: Payer: Self-pay | Admitting: Neurology

## 2023-01-23 ENCOUNTER — Encounter: Payer: Self-pay | Admitting: Family Medicine

## 2023-01-23 ENCOUNTER — Ambulatory Visit: Payer: BC Managed Care – PPO | Admitting: Neurology

## 2023-01-23 ENCOUNTER — Ambulatory Visit (INDEPENDENT_AMBULATORY_CARE_PROVIDER_SITE_OTHER): Payer: BC Managed Care – PPO | Admitting: Family Medicine

## 2023-01-23 VITALS — BP 102/61 | HR 113 | Ht 65.0 in | Wt 167.0 lb

## 2023-01-23 DIAGNOSIS — F331 Major depressive disorder, recurrent, moderate: Secondary | ICD-10-CM | POA: Diagnosis not present

## 2023-01-23 DIAGNOSIS — L03211 Cellulitis of face: Secondary | ICD-10-CM

## 2023-01-23 MED ORDER — DOXYCYCLINE HYCLATE 100 MG PO TABS: 100.0000 mg | ORAL_TABLET | Freq: Two times a day (BID) | ORAL | 0 refills | Status: DC

## 2023-01-23 NOTE — Patient Instructions (Signed)
Start spitting the fluoxetine in half.  Take half a tab daily x 8 days and then half a tab every other day for 6 days.

## 2023-01-23 NOTE — Assessment & Plan Note (Signed)
Go ahead and start tapering off the fluoxetine since its actually worsened her mood.  Could consider one of the newer options like Trintellix.  She is also been on venlafaxine for years and we discussed maybe tapering that down very very slowly as well since she really does not feel like it has been helpful.  Is already on Lamictal.

## 2023-01-23 NOTE — Progress Notes (Signed)
Acute Office Visit  Subjective:     Patient ID: Deanna Keller, female    DOB: 06/07/1980, 42 y.o.   MRN: [id number]  Chief Complaint  Patient presents with   Cyst    Under chin    HPI Patient is in today for swell underneath her chin.  She said a couple days ago she had a pimple there and yesterday got a sterile needle and tried to poke at it she said she got a little bit of pus out of it and some blood.  But it is gradually gotten really swollen and inflamed and painful and tender.  It has not drained anymore.  She also wanted to discuss her mood medication they tried multiple medicines and have had significant difficulty in getting her mood well-controlled.  Since starting the fluoxetine recently she actually feels worse she feels unmotivated she does not want a get out of bed she feels even more down.  ROS      Objective:    BP 102/61   Pulse (!) 113   Ht 5\' 5"  (1.651 m)   Wt 167 lb (75.8 kg)   SpO2 100%   BMI 27.79 kg/m    Physical Exam    No results found for any visits on 01/23/23.      Assessment & Plan:   Problem List Items Addressed This Visit       Other   Moderate episode of recurrent major depressive disorder (HCC)    Go ahead and start tapering off the fluoxetine since its actually worsened her mood.  Could consider one of the newer options like Trintellix.  She is also been on venlafaxine for years and we discussed maybe tapering that down very very slowly as well since she really does not feel like it has been helpful.  Is already on Lamictal.      Other Visit Diagnoses     Cellulitis, face    -  Primary      Cellulitis/abscess face -she has significant swelling in the triangle underneath her jaw.  I did just clean the area with alcohol and take a blade to remove the scab that was present I did not see any additional drainage but I did go ahead and do a wound culture.  I am going to start with doxycycline to cover for MRSA based on her  allergies she cannot do Augmentin or penicillin products.  Encouraged her to start the medication as soon as possible and to use warm compresses and avoid squeezing on the area she does have erythema going down her neck to about that mid area so I did take a picture today and encouraged her to go to the urgent care or emergency room if she felt like it was getting worse over the weekend.  Meds ordered this encounter  Medications   doxycycline (VIBRA-TABS) 100 MG tablet    Sig: Take 1 tablet (100 mg total) by mouth 2 (two) times daily.    Dispense:  14 tablet    Refill:  0    No follow-ups on file.  Nani Gasser, MD

## 2023-01-23 NOTE — Addendum Note (Signed)
Addended by: Deno Etienne on: 01/23/2023 12:25 PM   Modules accepted: Orders

## 2023-01-26 LAB — WOUND CULTURE: Organism ID, Bacteria: NONE SEEN

## 2023-01-28 ENCOUNTER — Ambulatory Visit: Payer: BC Managed Care – PPO

## 2023-01-28 NOTE — Progress Notes (Signed)
Hi Deanna Keller, the wound culture came back positive for staph infection.   Please let us know if you are not noticing significant improvement with the infection.

## 2023-01-31 ENCOUNTER — Encounter: Payer: Self-pay | Admitting: Neurology

## 2023-01-31 ENCOUNTER — Other Ambulatory Visit: Payer: Self-pay | Admitting: Family Medicine

## 2023-01-31 MED ORDER — DOXYCYCLINE HYCLATE 100 MG PO TABS: 100.0000 mg | ORAL_TABLET | Freq: Two times a day (BID) | ORAL | 0 refills | Status: DC

## 2023-02-11 ENCOUNTER — Encounter: Payer: Self-pay | Admitting: Family Medicine

## 2023-02-11 DIAGNOSIS — R569 Unspecified convulsions: Secondary | ICD-10-CM

## 2023-02-11 DIAGNOSIS — F411 Generalized anxiety disorder: Secondary | ICD-10-CM

## 2023-02-11 DIAGNOSIS — F331 Major depressive disorder, recurrent, moderate: Secondary | ICD-10-CM

## 2023-02-14 ENCOUNTER — Other Ambulatory Visit: Payer: Self-pay | Admitting: Neurology

## 2023-02-14 ENCOUNTER — Other Ambulatory Visit: Payer: Self-pay | Admitting: Physician Assistant

## 2023-02-14 DIAGNOSIS — G43911 Migraine, unspecified, intractable, with status migrainosus: Secondary | ICD-10-CM

## 2023-03-10 ENCOUNTER — Encounter: Payer: Self-pay | Admitting: Physician Assistant

## 2023-03-11 MED ORDER — ALPRAZOLAM 1 MG PO TABS: 1.0000 mg | ORAL_TABLET | Freq: Three times a day (TID) | ORAL | 1 refills | Status: DC | PRN

## 2023-03-27 ENCOUNTER — Encounter: Payer: Self-pay | Admitting: Physician Assistant

## 2023-03-27 DIAGNOSIS — Z8639 Personal history of other endocrine, nutritional and metabolic disease: Secondary | ICD-10-CM

## 2023-03-28 ENCOUNTER — Other Ambulatory Visit: Payer: Self-pay | Admitting: Physician Assistant

## 2023-03-28 DIAGNOSIS — G43911 Migraine, unspecified, intractable, with status migrainosus: Secondary | ICD-10-CM

## 2023-03-28 MED ORDER — AMBULATORY NON FORMULARY MEDICATION: 1 refills | Status: AC

## 2023-03-31 ENCOUNTER — Telehealth: Payer: BC Managed Care – PPO | Admitting: Neurology

## 2023-03-31 ENCOUNTER — Encounter: Payer: Self-pay | Admitting: Neurology

## 2023-03-31 DIAGNOSIS — G40909 Epilepsy, unspecified, not intractable, without status epilepticus: Secondary | ICD-10-CM

## 2023-03-31 DIAGNOSIS — G43909 Migraine, unspecified, not intractable, without status migrainosus: Secondary | ICD-10-CM

## 2023-03-31 DIAGNOSIS — M542 Cervicalgia: Secondary | ICD-10-CM

## 2023-03-31 DIAGNOSIS — G43911 Migraine, unspecified, intractable, with status migrainosus: Secondary | ICD-10-CM

## 2023-03-31 DIAGNOSIS — F419 Anxiety disorder, unspecified: Secondary | ICD-10-CM

## 2023-03-31 DIAGNOSIS — F32A Depression, unspecified: Secondary | ICD-10-CM

## 2023-03-31 MED ORDER — CYCLOBENZAPRINE HCL 10 MG PO TABS: 10.0000 mg | ORAL_TABLET | Freq: Every day | ORAL | 0 refills | Status: DC

## 2023-03-31 NOTE — Progress Notes (Signed)
PATIENT: Deanna Keller DOB: 01/03/1981  REASON FOR VISIT: follow up HISTORY FROM: patient PRIMARY NEUROLOGIST: Dr. Teresa Coombs  Chief Complaint  Patient presents with   Seizures   Migraine    Follow up    HISTORY OF PRESENT ILLNESS: Today 03/31/23: Patient was called today for video visit.  Last visit was in June and since then she has been doing well, no seizure or seizure like activity.  She is compliant with her medications including lamotrigine and zonisamide.  She tells me that she was doing well with weight loss medication but due to affordability she has not been taking it for 6-weeks and gained back some weight.  She is planning to restart her medication next month.  Again she has been doing well except 2 days ago when she started having headaches, headaches started in the back of her right eye and goes down to her neck.  She has been taking Fioricet.  She reports that Flexeril was helpful but she ran out of medication.  She is also complaining of neck pain, that goes down to her shoulders.    INTERVAL HISTORY 09/26/2022:  Patient called today for follow-up, she is at home.  Last visit was in May and at that time we planned to discontinue Keppra and topiramate and start the patient on zonisamide with her lamotrigine.  Currently she is on lamotrigine XR 400 mg daily and zonisamide 300 mg at bedtime.  She has not had any seizure or seizure-like DVT.   Currently, her main complaint is her headaches.  She did report the headaches are band like pressure type pain, she is currently on Flexeril, Tylenol and ibuprofen.  Reports medications take the edge off but headaches are still present.  She also complains of ringing in the ear. Denies any new falls. She did follow up with her PCP and she is back on Alprazolam.    INTERVAL HISTORY 08/23/2022:  Patient was called today for video visit, last visit was on April 3.  At that time due to side effect of tremor plan was to decrease her Topamax.   Again the Topamax was used for appetite suppression.  She reports going from Topamax 100 mg twice daily to 50 mg twice daily and had a breakthrough seizure on March 8.  She was mowing her lawn and she was found between the pavement and the street by her neighbor.  She hit her head sustaining a laceration.  She did have a generalized tonic-clonic seizure.  In the ED she did have a few staples for laceration, her head CT was negative for any acute abnormality and she was discharged home with Keppra 500 mg twice daily.  Since being home she has not had any seizure, she is compliant with the medication but reported increased anxiety and fear of having another seizure and she did have pressure like headaches last night    INTERVAL HISTORY 07/17/2022:  [name] presents today for follow-up, last visit was in October, since then, she has not had any seizures.  She is doing well on lamotrigine XR 400 mg daily.  On top of that she is also on Topamax for appetite suppression but the Topamax is giving her tremors.  She still struggling with her depression and following with psychiatry, she is on venlafaxine and fluoxetine.  Again no seizure or seizure-like activity. She is off Gabapentin   INTERVAL HISTORY 01/30/2022: Patient presents today for follow-up, last visit was in April, at that time we discontinued her  gabapentin.  She tolerated the procedure well.  She is currently on Lamotrigine 400 mg daily. Denies any seizure or seizure-like activity.  She feels she is more depressed than usual, currently she is separated and taking care of her son.  She is not wanting to do anything, does not want to go to work, stated after dropping her son to school in the morning she is just sitting on the couch all day.  She did follow-up with up with a psychiatrist who wanted to start her on Wellbutrin but due to her seizure disorder she did not want to start the Wellbutrin and has not seen the psychiatrist again for follow up.   Currently she is on Effexor and xanax.  She reported tried in the past Zoloft, Cymbalta, Lexapro and other antidepressants but she does not remember the name.   INTERVAL HISTORY 08/09/2021:  Deanna Keller is a 42 year old female with a history of seizures.  She returns today for follow-up.  She is currently on Lamictal XR 400 mg twice a day.  She denies any seizure events.  Reports that last seizure was when she decrease her dose of Lamictal.  The patient states that she is on gabapentin 600 mg 3 times a day.  She reports that she has gained weight on this medication and would like it discontinued.  She would like our office to instruct her on the weaning process as she is very fearful of having a breakthrough seizure.  She was also recently placed on Topamax 100 mg twice a day as an appetite suppressant.  Patient acknowledges when she was on Lamictal in the past she did not have any breakthrough seizures until she was instructed to decrease her dose.  HISTORY ( copied from Dr. Teresa Coombs) Patient presents today for follow-up, last visit was on June 2022. At that time she reported 2 additional seizures after her lamotrigine was decreased from 400 mg a day to 300 mg a day.  Since then her lamotrigine has been increased back to 400 mg daily.  Since her last visit she denies any additional seizures.  She reported that her mood is better, sleep is better, she cut down on her caffeine and cut down on her work.  Currently she does not have any complaint or concern but just wanted to know the level of Lamictal and gabapentin.  Last seizure was May 2022     Initial history from Dr Anne Hahn:  Deanna Keller is a 42 year old right-handed white female with a history of seizures 4 years ago while taking Ultram.  She had 2 seizures at that time, she was placed on Lamictal, she claims she had an EEG study and she believes she had an MRI but she is not sure, the records support that she had a noncontrast CT scan of the head for a  concussion in 2013.  The patient was placed on Lamictal, she has been on 300 mg a day of this medication since that time but she does not always take the medication properly, she misses doses frequently.  She was placed on Wellbutrin for weight loss, 8 days ago she suffered another seizure, she was in the car with her husband who was driving and the patient had a witnessed event.  The patient started with a confusional event, she was talking nonsense and then went into a stiffening event, she did not bite her tongue or lose control of bowels or the bladder.  The event lasted about 1 minute.  The patient  had no recollection of events during that time.  She has been increased on the Lamictal taking 200 mg twice daily.  She was taken off of the Wellbutrin.  She is sent to this office for an evaluation.  She denies any headaches, dizziness, vision changes, or any numbness or weakness of the face, arms, legs.  She denies any balance problems.  She does have a prior history of alcohol abuse but she has not had anything to drink in 8 years.  She denies any illicit drug use such as cocaine or marijuana.  She does use Xanax in low-dose, taking 0.5 mg twice daily.  The patient does report chronic fatigue issues and daytime drowsiness.     Handedness: Right handed    Seizure Type: passing out, being stiff, clinched teeth, unconscious    Current frequency: Last seizure in May 2022   Any injuries from seizures: None    Seizure risk factors: None    Previous ASMs: Lamotrigine, Gabapentin, Topiramate    Currenty ASMs: Lamotrigine XR 400 mg daily, Zonisamide 300 mg nightly    ASMs side effects: None    Brain Images: Nonspecific white matter hyperintensities   Previous EEGs: Normal routine EEG     OTHER MEDICAL CONDITIONS: Anxiety/Depression, Seizure, Gastric bypass surgery   REVIEW OF SYSTEMS: Out of a complete 14 system review of symptoms, the patient complains only of the following symptoms, and all  other reviewed systems are negative.  ALLERGIES: Allergies  Allergen Reactions   Augmentin [Amoxicillin-Pot Clavulanate] Nausea And Vomiting   Tramadol Other (See Comments)    Possible seizure   Wellbutrin [Bupropion]     Seizure   Phentermine     Increases seizure risk    HOME MEDICATIONS: Outpatient Medications Prior to Visit  Medication Sig Dispense Refill   ALPRAZolam (XANAX) 1 MG tablet Take 1 tablet (1 mg total) by mouth 3 (three) times daily as needed for anxiety. 90 tablet 1   AMBULATORY NON FORMULARY MEDICATION Semaglutide 2.5mg  with pyridoxine 10mg  per mL Inject 1.2mg (48units) once weekly 2 mL 1   AMBULATORY NON FORMULARY MEDICATION Semaglutide 2.5mg  with pyridoxine 10mg  per mL  Inject 2.4mg (96units) subcutaneously once weekly. 4 mL 1   BD INSULIN SYRINGE U/F 31G X 5/16" 1 ML MISC USE ONCE WITH VITAMIN B12 INJECTIONS 100 each 12   butalbital-acetaminophen-caffeine (FIORICET) 50-325-40 MG tablet Take 1 tablet by mouth every 6 (six) hours as needed for headache. 10 tablet 0   doxycycline (VIBRA-TABS) 100 MG tablet Take 1 tablet (100 mg total) by mouth 2 (two) times daily. 6 tablet 0   FLUoxetine (PROZAC) 20 MG tablet TAKE 1 TABLET BY MOUTH EVERY DAY 90 tablet 0   Insulin Pen Needle 31G X 6 MM MISC Use once with vitamin B12 injections 100 each 0   LamoTRIgine 200 MG TB24 24 hour tablet TAKE 2 TABLETS BY MOUTH EVERY DAY 180 tablet 0   venlafaxine XR (EFFEXOR-XR) 150 MG 24 hr capsule TAKE 1 CAPSULE BY MOUTH DAILY WITH BREAKFAST. 90 capsule 1   zonisamide (ZONEGRAN) 100 MG capsule TAKE 3 CAPSULES BY MOUTH AT BEDTIME. 270 capsule 1   cyclobenzaprine (FLEXERIL) 10 MG tablet TAKE 1 TABLET BY MOUTH THREE TIMES A DAY AS NEEDED 45 tablet 1   No facility-administered medications prior to visit.    PAST MEDICAL HISTORY: Past Medical History:  Diagnosis Date   Alcohol abuse    sober since 2011   Anxiety    Contusion    tail bone contusion in  Nov 2022   Seizure disorder (HCC)  12/28/2018    PAST SURGICAL HISTORY: Past Surgical History:  Procedure Laterality Date   CERVICAL CONE BIOPSY     GASTRIC BYPASS  2005    FAMILY HISTORY: Family History  Problem Relation Age of Onset   Colon cancer Mother    Depression Mother    Hypertension Father     SOCIAL HISTORY: Social History   Socioeconomic History   Marital status: Divorced    Spouse name: Not on file   Number of children: Not on file   Years of education: Not on file   Highest education level: Bachelor's degree (e.g., BA, AB, BS)  Occupational History   Not on file  Tobacco Use   Smoking status: Former    Current packs/day: 0.50    Average packs/day: 0.5 packs/day for 10.7 years (5.3 ttl pk-yrs)    Types: E-cigarettes, Cigarettes    Start date: 07/21/2012   Smokeless tobacco: Current   Tobacco comments:    uses nicotine pouches sometimes   Vaping Use   Vaping status: Never Used  Substance and Sexual Activity   Alcohol use: No   Drug use: No   Sexual activity: Yes    Partners: Male  Other Topics Concern   Not on file  Social History Narrative   Right handed   Caffeine maybe 1 cup/day   Lives at home with husband and 49 year old son    Social Drivers of Corporate investment banker Strain: Low Risk  (07/22/2022)   Overall Financial Resource Strain (CARDIA)    Difficulty of Paying Living Expenses: Not very hard  Food Insecurity: No Food Insecurity (07/22/2022)   Hunger Vital Sign    Worried About Running Out of Food in the Last Year: Never true    Ran Out of Food in the Last Year: Never true  Transportation Needs: No Transportation Needs (07/22/2022)   PRAPARE - Administrator, Civil Service (Medical): No    Lack of Transportation (Non-Medical): No  Physical Activity: Insufficiently Active (07/22/2022)   Exercise Vital Sign    Days of Exercise per Week: 1 day    Minutes of Exercise per Session: 20 min  Stress: Stress Concern Present (07/22/2022)   [employer] of  Occupational Health - Occupational Stress Questionnaire    Feeling of Stress : To some extent  Social Connections: Moderately Integrated (07/22/2022)   Social Connection and Isolation Panel [NHANES]    Frequency of Communication with Friends and Family: More than three times a week    Frequency of Social Gatherings with Friends and Family: More than three times a week    Attends Religious Services: More than 4 times per year    Active Member of [employer] or Organizations: Yes    Attends Engineer, structural: More than 4 times per year    Marital Status: Separated  Recent Concern: Social Connections - Somewhat Isolated (06/09/2022)   Received from [facility]   Social Network    How would you rate your social network (family, work, friends)?: Restricted participation with some degree of social isolation  Intimate Partner Violence: Not At Risk (08/21/2022)   Received from [facility], [facility]   HITS    Over the last 12 months how often did your partner physically hurt you?: Never    Over the last 12 months how often did your partner insult you or talk down to you?: Never  Over the last 12 months how often did your partner threaten you with physical harm?: Never    Over the last 12 months how often did your partner scream or curse at you?: Never      PHYSICAL EXAM  There were no vitals filed for this visit.    There is no height or weight on file to calculate BMI.  Generalized: Well developed, in no acute distress but appears anxious and tremulous.   Mentation: Alert oriented to time, place, history taking. Follows all commands speech and language fluent     DIAGNOSTIC DATA (LABS, IMAGING, TESTING) - I reviewed patient records, labs, notes, testing and imaging myself where available.  Lab Results  Component Value Date   WBC 4.2 08/08/2021   HGB 12.2 08/08/2021   HCT 37.3 08/08/2021   MCV 87 08/08/2021   PLT 364 08/08/2021      Component Value  Date/Time   NA 138 11/28/2021 0000   NA 140 08/08/2021 1106   K 5.0 11/28/2021 0000   CL 107 11/28/2021 0000   CO2 23 11/28/2021 0000   GLUCOSE 96 11/28/2021 0000   BUN 11 11/28/2021 0000   BUN 14 08/08/2021 1106   CREATININE 0.82 11/28/2021 0000   CALCIUM 8.9 11/28/2021 0000   CALCIUM 9.0 04/19/2013 0000   PROT 6.7 11/28/2021 0000   PROT 7.1 08/08/2021 1106   ALBUMIN 4.4 08/08/2021 1106   ALBUMIN 3.8 04/19/2013 0000   AST 29 11/28/2021 0000   ALT 31 (H) 11/28/2021 0000   ALKPHOS 83 08/08/2021 1106   BILITOT 0.2 11/28/2021 0000   BILITOT 0.3 08/08/2021 1106   GFRNONAA 109 04/05/2020 0711   GFRAA 126 04/05/2020 0711   Lab Results  Component Value Date   CHOL 205 (H) 02/20/2021   HDL 92 02/20/2021   LDLCALC 100 (H) 02/20/2021   TRIG 45 02/20/2021   CHOLHDL 2.2 02/20/2021   Lab Results  Component Value Date   HGBA1C 4.9 02/20/2021   Lab Results  Component Value Date   VITAMINB12 962 02/20/2021   Lab Results  Component Value Date   TSH 1.11 11/28/2021    Head CT 08/21/2022 Mild left lateral posterior parietal scalp soft tissue swelling with subcutaneous emphysema without underlying calvarial fracture.  No acute intracranial abnormality.    ASSESSMENT AND PLAN 42 y.o. year old female  has a past medical history of Alcohol abuse, Anxiety, Contusion, and Seizure disorder (HCC) (12/28/2018). here with:  1.  Epilepsy 2.  Depression  3.  Anxiety  Continue Lamictal XR 400 mg daily Continue with Venlafaxine and Fluoxetine. Please continue to follow up with psychiatrist prior to making any changes to these medications.  Continue with Zonisamide 300 mg nightly Will refer patient to physical therapy for cervicalgia Continue with Fioricet as needed (Only 10 tablets) for the headaches  Flexeril as needed, up to 3 times daily for cervicalgia  Follow-up in 6 months or sooner if needed   Virtual Visit via Video Note  I connected with  [name] on 03/31/23 at  9:45  AM EST by a video enabled telemedicine application and verified that I am speaking with the correct person using two identifiers.  Location: Patient: Home Provider: [facility] Office   I discussed the limitations of evaluation and management by telemedicine and the availability of in person appointments. The patient expressed understanding and agreed to proceed.   I discussed the assessment and treatment plan with the patient. The patient was provided an opportunity to ask  questions and all were answered. The patient agreed with the plan and demonstrated an understanding of the instructions.   The patient was advised to call back or seek an in-person evaluation if the symptoms worsen or if the condition fails to improve as anticipated.  I provided 30 minutes of non-face-to-face time during this encounter.  I have spent a total of 30 minutes dedicated to this patient today, preparing to see patient, performing a medically appropriate examination and evaluation, ordering tests and/or medications and procedures, and counseling and educating the patient/family/caregiver; independently interpreting result and communicating results to the family/patient/caregiver; and documenting clinical information in the electronic medical record.   Windell Norfolk, MD 03/31/2023, 7:53 PM [facility] [address] [place], [place] [address] [phone]

## 2023-03-31 NOTE — Patient Instructions (Signed)
Continue Lamictal XR 400 mg daily Continue with Venlafaxine and Fluoxetine. Please talk to your psychiatrist prior to making any changes to these medications.  Continue with Zonisamide 300 mg nightly Trial of Flexeril for headaches  Fioricet occasionally as needed for the headaches  Follow-up in 6 months or sooner if needed

## 2023-04-02 ENCOUNTER — Other Ambulatory Visit: Payer: Self-pay | Admitting: Physician Assistant

## 2023-04-02 DIAGNOSIS — F411 Generalized anxiety disorder: Secondary | ICD-10-CM

## 2023-04-02 DIAGNOSIS — F331 Major depressive disorder, recurrent, moderate: Secondary | ICD-10-CM

## 2023-04-03 ENCOUNTER — Telehealth: Payer: Self-pay | Admitting: Neurology

## 2023-04-03 ENCOUNTER — Other Ambulatory Visit: Payer: Self-pay | Admitting: Physician Assistant

## 2023-04-03 DIAGNOSIS — F331 Major depressive disorder, recurrent, moderate: Secondary | ICD-10-CM

## 2023-04-03 NOTE — Telephone Encounter (Signed)
Referral for physical therapy fax to [facility]. Phone: [phone], [phone]. Spoke the receptionist, they have facility in [place] and [place].

## 2023-04-04 ENCOUNTER — Other Ambulatory Visit: Payer: Self-pay | Admitting: Physician Assistant

## 2023-04-04 MED ORDER — LAMOTRIGINE ER 200 MG PO TB24: 2.0000 | ORAL_TABLET | Freq: Every day | ORAL | 0 refills | Status: DC

## 2023-04-24 ENCOUNTER — Other Ambulatory Visit: Payer: Self-pay | Admitting: Neurology

## 2023-04-24 DIAGNOSIS — G43911 Migraine, unspecified, intractable, with status migrainosus: Secondary | ICD-10-CM

## 2023-04-25 MED ORDER — CYCLOBENZAPRINE HCL 10 MG PO TABS: 10.0000 mg | ORAL_TABLET | Freq: Every day | ORAL | 0 refills | Status: DC

## 2023-05-04 ENCOUNTER — Encounter: Payer: Self-pay | Admitting: Physician Assistant

## 2023-05-04 ENCOUNTER — Other Ambulatory Visit: Payer: Self-pay | Admitting: Physician Assistant

## 2023-05-04 DIAGNOSIS — F411 Generalized anxiety disorder: Secondary | ICD-10-CM

## 2023-05-04 DIAGNOSIS — R569 Unspecified convulsions: Secondary | ICD-10-CM

## 2023-05-05 NOTE — Telephone Encounter (Signed)
Last fill 03/11/23 with 1 refill.

## 2023-05-21 ENCOUNTER — Encounter: Payer: Self-pay | Admitting: Neurology

## 2023-05-21 ENCOUNTER — Other Ambulatory Visit: Payer: Self-pay | Admitting: Physician Assistant

## 2023-05-21 ENCOUNTER — Encounter: Payer: Self-pay | Admitting: Physician Assistant

## 2023-05-21 NOTE — Telephone Encounter (Signed)
 Pls contact pt to schedule appt for 6 month migraine follow-up. Sending 1 month med refill. Thanks

## 2023-05-22 NOTE — Telephone Encounter (Signed)
 Patient scheduled for 05/27/23, thanks.

## 2023-05-23 MED ORDER — BUTALBITAL-APAP-CAFFEINE 50-325-40 MG PO TABS: 1.0000 | ORAL_TABLET | Freq: Four times a day (QID) | ORAL | 0 refills | Status: DC | PRN

## 2023-05-27 ENCOUNTER — Telehealth (INDEPENDENT_AMBULATORY_CARE_PROVIDER_SITE_OTHER): Payer: BC Managed Care – PPO | Admitting: Physician Assistant

## 2023-05-27 ENCOUNTER — Encounter: Payer: Self-pay | Admitting: Physician Assistant

## 2023-05-27 ENCOUNTER — Other Ambulatory Visit: Payer: Self-pay | Admitting: Physician Assistant

## 2023-05-27 VITALS — Ht 65.0 in | Wt 163.0 lb

## 2023-05-27 DIAGNOSIS — R569 Unspecified convulsions: Secondary | ICD-10-CM

## 2023-05-27 DIAGNOSIS — G43011 Migraine without aura, intractable, with status migrainosus: Secondary | ICD-10-CM | POA: Diagnosis not present

## 2023-05-27 DIAGNOSIS — F331 Major depressive disorder, recurrent, moderate: Secondary | ICD-10-CM

## 2023-05-27 DIAGNOSIS — F332 Major depressive disorder, recurrent severe without psychotic features: Secondary | ICD-10-CM

## 2023-05-27 DIAGNOSIS — F411 Generalized anxiety disorder: Secondary | ICD-10-CM | POA: Diagnosis not present

## 2023-05-27 MED ORDER — LUMATEPERONE TOSYLATE 42 MG PO CAPS: 42.0000 mg | ORAL_CAPSULE | Freq: Every day | ORAL | 2 refills | Status: DC

## 2023-05-27 MED ORDER — ZONISAMIDE 100 MG PO CAPS: 300.0000 mg | ORAL_CAPSULE | Freq: Every day | ORAL | Status: DC

## 2023-05-27 MED ORDER — ALPRAZOLAM 1 MG PO TABS: 1.0000 mg | ORAL_TABLET | Freq: Three times a day (TID) | ORAL | 5 refills | Status: DC | PRN

## 2023-05-27 MED ORDER — FLUOXETINE HCL 10 MG PO CAPS: 10.0000 mg | ORAL_CAPSULE | Freq: Every day | ORAL | 1 refills | Status: DC

## 2023-05-27 NOTE — Progress Notes (Unsigned)
..Virtual Visit via Video Note  I connected with Deanna Keller on 05/28/23 at  2:20 PM EST by a video enabled telemedicine application and verified that I am speaking with the correct person using two identifiers.  Location: Patient: home Provider: clinic  .Marland KitchenParticipating in visit:  Patient: Deanna Keller Provider: Tandy Gaw PA-C Provider in training: Josie PA-S   I discussed the limitations of evaluation and management by telemedicine and the availability of in person appointments. The patient expressed understanding and agreed to proceed.  History of Present Illness: Pt is a 43 yo female with Migraines, Seizures, MDD, GAD who needs refills. She continues to struggle with depression and anxiety. She sees neurology and suggested she do a trial of PT for migraine prevention and referred her to [facility]. She cannot afford another specialist right now and not going to be able to go. She continues to struggle with depression and lack of motivation. She needs prescriptions refilled today.   .. Active Ambulatory Problems    Diagnosis Date Noted   History of alcoholism (HCC) 07/21/2013   B12 deficiency 07/21/2013   Iron deficiency 07/21/2013   Depression 11/10/2013   Elevated LFTs 11/10/2013   Chronic migraine 11/10/2013   Malabsorption 11/10/2013   S/P gastric bypass 11/11/2013   Vitamin D deficiency 11/11/2013   Recovering alcoholic in remission (HCC) 06/16/2014   Lumbar degenerative disc disease 06/20/2014   Bariatric surgery status in pregnancy 02/10/2013   Phlebectasia 10/21/2011   Hypoglycemia following gastrointestinal surgery 04/12/2013   Generalized seizure (HCC) 08/30/2014   IUD check up 11/24/2014   Postoperative blind loop syndrome 04/12/2013   Generalized anxiety disorder 09/06/2015   Stress reaction 06/16/2017   Class 2 obesity due to excess calories without serious comorbidity with body mass index (BMI) of 35.0 to 35.9 in adult 10/03/2017   Weight gain 10/03/2017   No energy  02/05/2018   Thyromegaly 02/05/2018   Upper back pain 02/05/2018   Hx of thyroid nodule 02/08/2018   Frequent infections 06/16/2018   Arthralgia 06/16/2018   Tachycardia 12/28/2018   Anxiety about health 12/28/2018   Insomnia 12/28/2018   Seizure (HCC) 12/28/2018   Chronic rhinitis 04/03/2020   Moderate episode of recurrent major depressive disorder (HCC) 09/12/2020   Post-seizure headache 09/12/2020   Constipation 03/05/2021   Bright red blood per rectum 03/06/2021   Acute buttock pain 03/06/2021   Fall 03/06/2021   Pre-diabetes 07/29/2022   History of obesity 07/29/2022   Tremor 07/29/2022   Post concussion syndrome 09/02/2022   Laceration of scalp without foreign body 09/02/2022   Severe episode of recurrent major depressive disorder, without psychotic features (HCC) 06/10/2022   Intractable migraine with status migrainosus 09/23/2022   Acute effusion of right ear 09/23/2022   Resolved Ambulatory Problems    Diagnosis Date Noted   Lumbar back pain with radiculopathy affecting left lower extremity 10/11/2014   Viral URI 12/20/2014   Benzodiazepine misuse (HCC) 09/06/2015   Acute bronchitis 12/26/2015   Traumatic coccydynia 03/05/2021   Past Medical History:  Diagnosis Date   Alcohol abuse    Anxiety    Contusion    Seizure disorder (HCC) 12/28/2018    Observations/Objective: No acute distress Normal breathing Normal mood and appearance  .[name]    05/28/2023    5:48 AM 07/22/2022   10:03 AM 06/05/2022    9:38 AM 11/28/2021   10:51 AM 06/21/2021    2:17 PM  Depression screen PHQ 2/9  Decreased Interest 2 1 3 1    Down, Depressed,  Hopeless 2 2 3 1    PHQ - 2 Score 4 3 6 2    Altered sleeping 2 3 3 2    Tired, decreased energy 2 3 3 3    Change in appetite 2 0 0 3   Feeling bad or failure about yourself  2 0 2 3   Trouble concentrating 2 2 3 1    Moving slowly or fidgety/restless 2 0 0 0   Suicidal thoughts 0 0 0 0   PHQ-9 Score 16 11 17 14    Difficult doing  work/chores  Somewhat difficult Extremely dIfficult Very difficult      Information is confidential and restricted. Go to Review Flowsheets to unlock data.   .    05/28/2023    5:48 AM 07/22/2022   10:05 AM 06/05/2022    9:39 AM 11/28/2021   10:52 AM  GAD 7 : Generalized Anxiety Score  Nervous, Anxious, on Edge 2 2 3 2   Control/stop worrying 2 2 3 2   Worry too much - different things 2 2 3 2   Trouble relaxing 2 2 2 2   Restless 2 2 1 3   Easily annoyed or irritable 2 2 1 3   Afraid - awful might happen 2 0 0 0  Total GAD 7 Score 14 12 13 14   Anxiety Difficulty Very difficult Very difficult Extremely difficult Very difficult       Assessment and Plan: Marland [name] KitchenBea was seen today for medical management of chronic issues.  Diagnoses and all orders for this visit:  Moderate episode of recurrent major depressive disorder (HCC) -     FLUoxetine (PROZAC) 10 MG capsule; Take 1 capsule (10 mg total) by mouth daily.  Generalized anxiety disorder -     ALPRAZolam (XANAX) 1 MG tablet; Take 1 tablet (1 mg total) by mouth 3 (three) times daily as needed for anxiety. -     FLUoxetine (PROZAC) 10 MG capsule; Take 1 capsule (10 mg total) by mouth daily. -     lumateperone tosylate (CAPLYTA) 42 MG capsule; Take 1 capsule (42 mg total) by mouth daily.  Seizure (HCC) -     ALPRAZolam (XANAX) 1 MG tablet; Take 1 tablet (1 mg total) by mouth 3 (three) times daily as needed for anxiety.  Intractable migraine without aura and with status migrainosus -     zonisamide (ZONEGRAN) 100 MG capsule; Take 3 capsules (300 mg total) by mouth at bedtime.  Severe episode of recurrent major depressive disorder, without psychotic features (HCC) -     lumateperone tosylate (CAPLYTA) 42 MG capsule; Take 1 capsule (42 mg total) by mouth daily.   Pt denies any SI/HC Stop prozac and consider caplyta, use coupon card online Continue effexor and lamictal Refilled xanax .Marland KitchenPDMP reviewed during this encounter. Follow up in  2 months for mood  Neurology managing migraines Zonisamide refilled    Follow Up Instructions:    I discussed the assessment and treatment plan with the patient. The patient was provided an opportunity to ask questions and all were answered. The patient agreed with the plan and demonstrated an understanding of the instructions.   The patient was advised to call back or seek an in-person evaluation if the symptoms worsen or if the condition fails to improve as anticipated.   Tandy Gaw, PA-C

## 2023-05-27 NOTE — Telephone Encounter (Signed)
Patient seen by virtual visit today.

## 2023-06-17 ENCOUNTER — Other Ambulatory Visit: Payer: Self-pay | Admitting: Neurology

## 2023-06-17 DIAGNOSIS — Z3202 Encounter for pregnancy test, result negative: Secondary | ICD-10-CM | POA: Diagnosis not present

## 2023-06-17 DIAGNOSIS — Z113 Encounter for screening for infections with a predominantly sexual mode of transmission: Secondary | ICD-10-CM | POA: Diagnosis not present

## 2023-06-17 DIAGNOSIS — N760 Acute vaginitis: Secondary | ICD-10-CM | POA: Diagnosis not present

## 2023-06-17 DIAGNOSIS — G43911 Migraine, unspecified, intractable, with status migrainosus: Secondary | ICD-10-CM

## 2023-06-17 DIAGNOSIS — Z1159 Encounter for screening for other viral diseases: Secondary | ICD-10-CM | POA: Diagnosis not present

## 2023-06-17 NOTE — Telephone Encounter (Signed)
 Rx refilled per last note. "Flexeril as needed, up to 3 times daily for cervicalgia".  Last appointment: 03/31/2023 Next appointment: 10/21/2023

## 2023-06-27 ENCOUNTER — Encounter: Payer: Self-pay | Admitting: Physician Assistant

## 2023-07-08 ENCOUNTER — Other Ambulatory Visit: Payer: Self-pay | Admitting: Physician Assistant

## 2023-07-10 MED ORDER — LAMOTRIGINE ER 200 MG PO TB24: 2.0000 | ORAL_TABLET | Freq: Every day | ORAL | 0 refills | Status: DC

## 2023-07-15 ENCOUNTER — Telehealth: Payer: Self-pay | Admitting: Physician Assistant

## 2023-07-15 NOTE — Telephone Encounter (Signed)
 Patient called in stating that she was was at the gas station and her Xanax pills fell out. She also states that she keep some in her purse and at home. She only has 8 pills left at home and wants to know what she can do being that her next refill isn't until the 11th.

## 2023-07-16 ENCOUNTER — Encounter: Payer: Self-pay | Admitting: Physician Assistant

## 2023-07-16 DIAGNOSIS — R569 Unspecified convulsions: Secondary | ICD-10-CM

## 2023-07-16 DIAGNOSIS — F411 Generalized anxiety disorder: Secondary | ICD-10-CM

## 2023-07-16 MED ORDER — ALPRAZOLAM 1 MG PO TABS: 1.0000 mg | ORAL_TABLET | Freq: Three times a day (TID) | ORAL | 0 refills | Status: DC | PRN

## 2023-07-18 NOTE — Telephone Encounter (Signed)
 Per pharmacy patient picked up alprazolam yesterday .

## 2023-08-04 ENCOUNTER — Encounter: Payer: Self-pay | Admitting: Physician Assistant

## 2023-08-15 ENCOUNTER — Other Ambulatory Visit: Payer: Self-pay | Admitting: Neurology

## 2023-08-15 DIAGNOSIS — G43011 Migraine without aura, intractable, with status migrainosus: Secondary | ICD-10-CM

## 2023-08-15 NOTE — Telephone Encounter (Signed)
 Last seen on 03/31/23 Follow up scheduled on 10/21/23

## 2023-08-18 ENCOUNTER — Other Ambulatory Visit: Payer: Self-pay | Admitting: Neurology

## 2023-08-18 DIAGNOSIS — G43911 Migraine, unspecified, intractable, with status migrainosus: Secondary | ICD-10-CM

## 2023-09-03 ENCOUNTER — Telehealth: Payer: Self-pay

## 2023-09-03 ENCOUNTER — Encounter: Payer: Self-pay | Admitting: Neurology

## 2023-09-03 NOTE — Telephone Encounter (Signed)
 Call to patient after receiving mychart message, patient thinks she had a seizure a few minutes ago. She reports taking a nap on the couch and getting up to go to the bathroom and says it was an out of body experience, she fell and hit her head on the wall and couldn't move her arms. She denies losing control of bladder and did not bite her tongue. She reports medication compliance and denies other seizure triggers, She reports being alone at home and while talking to her she was saying things not pertinent to conversation and not realizing she was saying things. I advised she should call 911 due to potential seizure and head strike and I would follow up with Dr. Samara Crest for recommendations. Patient verbalized understanding and appreciative of call

## 2023-09-03 NOTE — Telephone Encounter (Signed)
 Agree with recs. Will follow up after ED visit.

## 2023-09-12 ENCOUNTER — Ambulatory Visit: Payer: Self-pay

## 2023-09-12 DIAGNOSIS — M76891 Other specified enthesopathies of right lower limb, excluding foot: Secondary | ICD-10-CM | POA: Diagnosis not present

## 2023-09-12 NOTE — Telephone Encounter (Signed)
 FYI

## 2023-09-12 NOTE — Telephone Encounter (Signed)
  Chief Complaint: knee injury Symptoms: right knee pain with swelling to kneecap Frequency: occurred last Wednesday Pertinent Negatives: Patient denies fever Disposition: [] ED /[x] Urgent Care (no appt availability in office) / [] Appointment(In office/virtual)/ []  [facility]/ [] Home Care/ [] Refused Recommended Disposition /[] [facility]/ []  Follow-up with PCP Additional Notes: patient calling to report injury to right knee. Patient states she lost her balance and twisted her knee which occurred last Wednesday. Patient endorses pain to right knee (9 out of 10) along with swelling to kneecap. Patient had been trying conservative therapy at home-brace on knee along with ibuprofen and icing the knee. No improvement in symptoms. Patient is recommended to be seen today per protocol. Patient is recommended to urgent care as no availability in office. Patient verbalized understanding and all questions answered.    Copied from CRM [id number]. Topic: Clinical - Red Word Triage >> Sep 12, 2023 11:38 AM Tiffany H wrote: Red Word that prompted transfer to Nurse Triage:  Patient called to advise that last Wednesday, she tripped and twisted her knee. She's been trying conservative therapy for treatment but with no improvement. Pain radiates up and down leg. There's some swelling in the kneecap. Pain primarily in inner lower part of knee, mostly outside. Please assist. Reason for Disposition  [1] SEVERE pain AND [2] not improved 2 hours after pain medicine/ice packs  Answer Assessment - Initial Assessment Questions 1. MECHANISM: "How did the injury happen?" (e.g., twisting injury, direct blow)      Patient lost her balance and twisted her knee 2. ONSET: "When did the injury happen?" (Minutes or hours ago)      Occurred last Wednesday 3. LOCATION: "Where is the injury located?"      Right knee 4. APPEARANCE of INJURY: "What does the injury look like?"      Swelling to kneecap 5.  SEVERITY: "Can you put weight on that leg?" "Can you walk?"      Able to walk and put weight on the leg but only with a brace on 6. SIZE: For cuts, bruises, or swelling, ask: "How large is it?" (e.g., inches or centimeters;  entire joint)      Swelling to kneecap 7. PAIN: "Is there pain?" If Yes, ask: "How bad is the pain?"  "What does it keep you from doing?" (e.g., Scale 1-10; or mild, moderate, severe)   -  NONE: (0): no pain   -  MILD (1-3): doesn't interfere with normal activities    -  MODERATE (4-7): interferes with normal activities (e.g., work or school) or awakens from sleep, limping    -  SEVERE (8-10): excruciating pain, unable to do any normal activities, unable to walk     9 out of 10 8. TETANUS: For any breaks in the skin, ask: "When was the last tetanus booster?"     N/A 9. OTHER SYMPTOMS: "Do you have any other symptoms?"  (e.g., "pop" when knee injured, swelling, locking, buckling)      swelling 10. PREGNANCY: "Is there any chance you are pregnant?" "When was your last menstrual period?"       no  Protocols used: Knee Injury-A-AH

## 2023-09-17 ENCOUNTER — Other Ambulatory Visit: Payer: Self-pay | Admitting: Neurology

## 2023-09-17 DIAGNOSIS — G43911 Migraine, unspecified, intractable, with status migrainosus: Secondary | ICD-10-CM

## 2023-09-22 ENCOUNTER — Encounter: Payer: Self-pay | Admitting: Physician Assistant

## 2023-09-22 DIAGNOSIS — Z8639 Personal history of other endocrine, nutritional and metabolic disease: Secondary | ICD-10-CM

## 2023-09-22 NOTE — Telephone Encounter (Signed)
 Patient requesting rx rf of compounded weight loss medication  Just FYI- If this is Liposlim then per [facility] will need Liposlim in the prescription somewhere. Last written 03/28/2023 Last OV 05/27/2023 video visit  Upcoming appt = none

## 2023-09-23 MED ORDER — TIRZEPATIDE 10 MG/0.5ML ~~LOC~~ SOAJ: SUBCUTANEOUS | Status: DC

## 2023-09-23 NOTE — Telephone Encounter (Signed)
 Prescription faxed to [facility].

## 2023-09-23 NOTE — Telephone Encounter (Signed)
 Ok signed rx to fax to [facility].

## 2023-09-24 NOTE — Telephone Encounter (Signed)
 Patient informed of message from [place], [place] . States pharmacy also informed her to start a little less at beginning

## 2023-10-08 ENCOUNTER — Encounter: Payer: Self-pay | Admitting: Physician Assistant

## 2023-10-08 ENCOUNTER — Other Ambulatory Visit: Payer: Self-pay | Admitting: Physician Assistant

## 2023-10-08 DIAGNOSIS — F411 Generalized anxiety disorder: Secondary | ICD-10-CM

## 2023-10-08 DIAGNOSIS — F331 Major depressive disorder, recurrent, moderate: Secondary | ICD-10-CM

## 2023-10-08 NOTE — Telephone Encounter (Signed)
 Forwarding message to Dr. Alvan covering Deanna Keller Requesting rx rf of Lamotrigine  200mg   Last written 07/10/2023 Upcoming appt = none

## 2023-10-09 NOTE — Telephone Encounter (Signed)
 Needs to schedule and then I will send.

## 2023-10-10 MED ORDER — LAMOTRIGINE ER 200 MG PO TB24: 2.0000 | ORAL_TABLET | Freq: Every day | ORAL | 0 refills | Status: DC

## 2023-10-10 NOTE — Telephone Encounter (Signed)
 I did go ahead and fill since she does have a scheduled appoint with neurology.  They should be able to take over that prescription at that point in time.

## 2023-10-13 ENCOUNTER — Encounter: Payer: Self-pay | Admitting: Physician Assistant

## 2023-10-13 DIAGNOSIS — F411 Generalized anxiety disorder: Secondary | ICD-10-CM

## 2023-10-13 DIAGNOSIS — F331 Major depressive disorder, recurrent, moderate: Secondary | ICD-10-CM

## 2023-10-13 MED ORDER — VENLAFAXINE HCL ER 150 MG PO CP24: 150.0000 mg | ORAL_CAPSULE | Freq: Every day | ORAL | 1 refills | Status: DC

## 2023-10-21 ENCOUNTER — Encounter: Payer: Self-pay | Admitting: Neurology

## 2023-10-21 ENCOUNTER — Telehealth (INDEPENDENT_AMBULATORY_CARE_PROVIDER_SITE_OTHER): Payer: BC Managed Care – PPO | Admitting: Neurology

## 2023-10-21 DIAGNOSIS — F32A Depression, unspecified: Secondary | ICD-10-CM

## 2023-10-21 DIAGNOSIS — F419 Anxiety disorder, unspecified: Secondary | ICD-10-CM

## 2023-10-21 DIAGNOSIS — G43011 Migraine without aura, intractable, with status migrainosus: Secondary | ICD-10-CM | POA: Diagnosis not present

## 2023-10-21 MED ORDER — ZONISAMIDE 100 MG PO CAPS: 400.0000 mg | ORAL_CAPSULE | Freq: Every day | ORAL | 3 refills | Status: DC

## 2023-10-21 NOTE — Progress Notes (Signed)
 PATIENT: Deanna Keller DOB: 1981-02-01  REASON FOR VISIT: follow up HISTORY FROM: patient PRIMARY NEUROLOGIST: Dr. Gregg  Chief Complaint  Patient presents with   Seizures    Follow up. Also worsening depression     HISTORY OF PRESENT ILLNESS: Today 10/21/23: Patient for call today for follow-up, last visit was in December.  At that time she was doing well, no seizure or seizure like activity.  We continued her on lamotrigine  and zonisamide .  She denies any seizure or seizure like activity, compliant with her medication but does report worsening of her depression.  She is on both venlafaxine  and fluoxetine  and tells me that her depression got worse to the point that fluoxetine  was reduced from 20 to 10 mg.  She is not currently seeing psychiatry or psychology, and she is interested in seeing someone.  She is doing well with her weight loss, on Mounjaro  and lost considerable amount of weight.  Again headaches are manageable, seizures are well-controlled but her depressive symptoms are getting worse.   INTERVAL HISTORY 03/31/2023:  Patient was called today for video visit.  Last visit was in June and since then she has been doing well, no seizure or seizure like activity.  She is compliant with her medications including lamotrigine  and zonisamide .  She tells me that she was doing well with weight loss medication but due to affordability she has not been taking it for 6-weeks and gained back some weight.  She is planning to restart her medication next month.  Again she has been doing well except 2 days ago when she started having headaches, headaches started in the back of her right eye and goes down to her neck.  She has been taking Fioricet.  She reports that Flexeril  was helpful but she ran out of medication.  She is also complaining of neck pain, that goes down to her shoulders.    INTERVAL HISTORY 09/26/2022:  Patient called today for follow-up, she is at home.  Last visit was in May  and at that time we planned to discontinue Keppra  and topiramate  and start the patient on zonisamide  with her lamotrigine .  Currently she is on lamotrigine  XR 400 mg daily and zonisamide  300 mg at bedtime.  She has not had any seizure or seizure-like activity.   Currently, her main complaint is her headaches.  She did report the headaches are band like pressure type pain, she is currently on Flexeril , Tylenol  and ibuprofen.  Reports medications take the edge off but headaches are still present.  She also complains of ringing in the ear. Denies any new falls. She did follow up with her PCP and she is back on Alprazolam .    INTERVAL HISTORY 08/23/2022:  Patient was called today for video visit, last visit was on April 3.  At that time due to side effect of tremor plan was to decrease her Topamax .  Again the Topamax  was used for appetite suppression.  She reports going from Topamax  100 mg twice daily to 50 mg twice daily and had a breakthrough seizure on March 8.  She was mowing her lawn and she was found between the pavement and the street by her neighbor.  She hit her head sustaining a laceration.  She did have a generalized tonic-clonic seizure.  In the ED she did have a few staples for laceration, her head CT was negative for any acute abnormality and she was discharged home with Keppra  500 mg twice daily.  Since being home  she has not had any seizure, she is compliant with the medication but reported increased anxiety and fear of having another seizure and she did have pressure like headaches last night    INTERVAL HISTORY 07/17/2022:  Deanna Keller presents today for follow-up, last visit was in October, since then, she has not had any seizures.  She is doing well on lamotrigine  XR 400 mg daily.  On top of that she is also on Topamax  for appetite suppression but the Topamax  is giving her tremors.  She still struggling with her depression and following with psychiatry, she is on venlafaxine  and fluoxetine .  Again no  seizure or seizure-like activity. She is off Gabapentin    INTERVAL HISTORY 01/30/2022: Patient presents today for follow-up, last visit was in April, at that time we discontinued her gabapentin .  She tolerated the procedure well.  She is currently on Lamotrigine  400 mg daily. Denies any seizure or seizure-like activity.  She feels she is more depressed than usual, currently she is separated and taking care of her son.  She is not wanting to do anything, does not want to go to work, stated after dropping her son to school in the morning she is just sitting on the couch all day.  She did follow-up with up with a psychiatrist who wanted to start her on Wellbutrin  but due to her seizure disorder she did not want to start the Wellbutrin  and has not seen the psychiatrist again for follow up.  Currently she is on Effexor  and xanax .  She reported tried in the past Zoloft, Cymbalta , Lexapro  and other antidepressants but she does not remember the name.   INTERVAL HISTORY 08/09/2021:  Deanna Keller is a 43 year old female with a history of seizures.  She returns today for follow-up.  She is currently on Lamictal  XR 400 mg twice a day.  She denies any seizure events.  Reports that last seizure was when she decrease her dose of Lamictal .  The patient states that she is on gabapentin  600 mg 3 times a day.  She reports that she has gained weight on this medication and would like it discontinued.  She would like our office to instruct her on the weaning process as she is very fearful of having a breakthrough seizure.  She was also recently placed on Topamax  100 mg twice a day as an appetite suppressant.  Patient acknowledges when she was on Lamictal  in the past she did not have any breakthrough seizures until she was instructed to decrease her dose.  HISTORY ( copied from Dr. Gregg) Patient presents today for follow-up, last visit was on June 2022. At that time she reported 2 additional seizures after her lamotrigine  was  decreased from 400 mg a day to 300 mg a day.  Since then her lamotrigine  has been increased back to 400 mg daily.  Since her last visit she denies any additional seizures.  She reported that her mood is better, sleep is better, she cut down on her caffeine  and cut down on her work.  Currently she does not have any complaint or concern but just wanted to know the level of Lamictal  and gabapentin .  Last seizure was May 2022     Initial history from Dr Jenel:  Deanna Keller is a 43 year old right-handed white female with a history of seizures 4 years ago while taking Ultram .  She had 2 seizures at that time, she was placed on Lamictal , she claims she had an EEG study and she believes she had  an MRI but she is not sure, the records support that she had a noncontrast CT scan of the head for a concussion in 2013.  The patient was placed on Lamictal , she has been on 300 mg a day of this medication since that time but she does not always take the medication properly, she misses doses frequently.  She was placed on Wellbutrin  for weight loss, 8 days ago she suffered another seizure, she was in the car with her husband who was driving and the patient had a witnessed event.  The patient started with a confusional event, she was talking nonsense and then went into a stiffening event, she did not bite her tongue or lose control of bowels or the bladder.  The event lasted about 1 minute.  The patient had no recollection of events during that time.  She has been increased on the Lamictal  taking 200 mg twice daily.  She was taken off of the Wellbutrin .  She is sent to this office for an evaluation.  She denies any headaches, dizziness, vision changes, or any numbness or weakness of the face, arms, legs.  She denies any balance problems.  She does have a prior history of alcohol abuse but she has not had anything to drink in 8 years.  She denies any illicit drug use such as cocaine or marijuana.  She does use Xanax  in low-dose,  taking 0.5 mg twice daily.  The patient does report chronic fatigue issues and daytime drowsiness.     Handedness: Right handed    Seizure Type: passing out, being stiff, clinched teeth, unconscious    Current frequency: Last seizure in May 2022   Any injuries from seizures: None    Seizure risk factors: None    Previous ASMs: Lamotrigine , Gabapentin , Topiramate     Currenty ASMs: Lamotrigine  XR 400 mg daily, Zonisamide  400 mg nightly    ASMs side effects: None    Brain Images: Nonspecific white matter hyperintensities   Previous EEGs: Normal routine EEG     OTHER MEDICAL CONDITIONS: Anxiety/Depression, Seizure, Gastric bypass surgery   REVIEW OF SYSTEMS: Out of a complete 14 system review of symptoms, the patient complains only of the following symptoms, and all other reviewed systems are negative.  ALLERGIES: Allergies  Allergen Reactions   Augmentin  [Amoxicillin -Pot Clavulanate] Nausea And Vomiting   Tramadol  Other (See Comments)    Possible seizure   Wellbutrin  [Bupropion ]     Seizure   Phentermine      Increases seizure risk    HOME MEDICATIONS: Outpatient Medications Prior to Visit  Medication Sig Dispense Refill   ALPRAZolam  (XANAX ) 1 MG tablet Take 1 tablet (1 mg total) by mouth 3 (three) times daily as needed for anxiety. 30 tablet 0   AMBULATORY NON FORMULARY MEDICATION Semaglutide  2.5mg  with pyridoxine 10mg  per mL Inject 1.2mg (48units) once weekly 2 mL 1   AMBULATORY NON FORMULARY MEDICATION Semaglutide  2.5mg  with pyridoxine 10mg  per mL  Inject 2.4mg (96units) subcutaneously once weekly. 4 mL 1   BD INSULIN  SYRINGE U/F 31G X 5/16 1 ML MISC USE ONCE WITH VITAMIN B12 INJECTIONS 100 each 12   butalbital -acetaminophen -caffeine  (FIORICET) 50-325-40 MG tablet Take 1 tablet by mouth every 6 (six) hours as needed for headache. 10 tablet 0   cyclobenzaprine  (FLEXERIL ) 10 MG tablet TAKE 1 TABLET BY MOUTH EVERYDAY AT BEDTIME 90 tablet 3   FLUoxetine  (PROZAC ) 10 MG  capsule Take 1 capsule (10 mg total) by mouth daily. 90 capsule 1   Insulin  Pen Needle 31G  X 6 MM MISC Use once with vitamin B12 injections 100 each 0   LamoTRIgine  200 MG TB24 24 hour tablet Take 2 tablets (400 mg total) by mouth daily. 180 tablet 0   lumateperone  tosylate (CAPLYTA ) 42 MG capsule Take 1 capsule (42 mg total) by mouth daily. 30 capsule 2   tirzepatide  (MOUNJARO ) 10 MG/0.5ML Pen Liposlim.  Tirzepatide /Pyridoxine/Thiamine /L-Carnitine 10mg /mL.  Inject 2.5 mg/25 units subcu weekly for 4 weeks then 5 mg/50 units subcu weekly for 4 weeks then 7.5 mg/75 units subcu weekly for 4 weeks then 10 mg/100 units subcu weekly for 4 weeks then 15 mg/150 units subcu weekly     venlafaxine  XR (EFFEXOR -XR) 150 MG 24 hr capsule Take 1 capsule (150 mg total) by mouth daily with breakfast. 90 capsule 1   zonisamide  (ZONEGRAN ) 100 MG capsule TAKE 3 CAPSULES BY MOUTH AT BEDTIME 270 capsule 0   No facility-administered medications prior to visit.    PAST MEDICAL HISTORY: Past Medical History:  Diagnosis Date   Alcohol abuse    sober since 2011   Anxiety    Contusion    tail bone contusion in Nov 2022   Seizure disorder (HCC) 12/28/2018    PAST SURGICAL HISTORY: Past Surgical History:  Procedure Laterality Date   CERVICAL CONE BIOPSY     GASTRIC BYPASS  2005    FAMILY HISTORY: Family History  Problem Relation Age of Onset   Colon cancer Mother    Depression Mother    Hypertension Father     SOCIAL HISTORY: Social History   Socioeconomic History   Marital status: Divorced    Spouse name: Not on file   Number of children: Not on file   Years of education: Not on file   Highest education level: Bachelor's degree (e.g., BA, AB, BS)  Occupational History   Not on file  Tobacco Use   Smoking status: Former    Current packs/day: 0.50    Average packs/day: 0.5 packs/day for 11.2 years (5.6 ttl pk-yrs)    Types: E-cigarettes, Cigarettes    Start date: 07/21/2012   Smokeless tobacco:  Current   Tobacco comments:    uses nicotine pouches sometimes   Vaping Use   Vaping status: Never Used  Substance and Sexual Activity   Alcohol use: No   Drug use: No   Sexual activity: Yes    Partners: Male  Other Topics Concern   Not on file  Social History Narrative   Right handed   Caffeine  maybe 1 cup/day   Lives at home with husband and 39 year old son    Social Drivers of Corporate investment banker Strain: Medium Risk (05/27/2023)   Overall Financial Resource Strain (CARDIA)    Difficulty of Paying Living Expenses: Somewhat hard  Food Insecurity: No Food Insecurity (05/27/2023)   Hunger Vital Sign    Worried About Running Out of Food in the Last Year: Never true    Ran Out of Food in the Last Year: Never true  Transportation Needs: No Transportation Needs (05/27/2023)   PRAPARE - Administrator, Civil Service (Medical): No    Lack of Transportation (Non-Medical): No  Physical Activity: Insufficiently Active (05/27/2023)   Exercise Vital Sign    Days of Exercise per Week: 1 day    Minutes of Exercise per Session: 30 min  Stress: Stress Concern Present (05/27/2023)   [employer] of Occupational Health - Occupational Stress Questionnaire    Feeling of Stress : Very much  Social Connections: Moderately Integrated (05/27/2023)   Social Connection and Isolation Panel    Frequency of Communication with Friends and Family: More than three times a week    Frequency of Social Gatherings with Friends and Family: More than three times a week    Attends Religious Services: 1 to 4 times per year    Active Member of [employer] or Organizations: Yes    Attends Engineer, structural: More than 4 times per year    Marital Status: Divorced  Intimate Partner Violence: Not At Risk (08/21/2022)   Received from [facility]   HITS    Over the last 12 months how often did your partner physically hurt you?: Never    Over the last 12 months how often did your partner  insult you or talk down to you?: Never    Over the last 12 months how often did your partner threaten you with physical harm?: Never    Over the last 12 months how often did your partner scream or curse at you?: Never    PHYSICAL EXAM  There were no vitals filed for this visit.   There is no height or weight on file to calculate BMI.  Generalized: Well developed, in no acute distress but appears anxious and tremulous.   Mentation: Alert oriented to time, place, history taking. Follows all commands speech and language fluent     DIAGNOSTIC DATA (LABS, IMAGING, TESTING) - I reviewed patient records, labs, notes, testing and imaging myself where available.  Lab Results  Component Value Date   WBC 4.2 08/08/2021   HGB 12.2 08/08/2021   HCT 37.3 08/08/2021   MCV 87 08/08/2021   PLT 364 08/08/2021      Component Value Date/Time   NA 138 11/28/2021 0000   NA 140 08/08/2021 1106   K 5.0 11/28/2021 0000   CL 107 11/28/2021 0000   CO2 23 11/28/2021 0000   GLUCOSE 96 11/28/2021 0000   BUN 11 11/28/2021 0000   BUN 14 08/08/2021 1106   CREATININE 0.82 11/28/2021 0000   CALCIUM 8.9 11/28/2021 0000   CALCIUM 9.0 04/19/2013 0000   PROT 6.7 11/28/2021 0000   PROT 7.1 08/08/2021 1106   ALBUMIN 4.4 08/08/2021 1106   ALBUMIN 3.8 04/19/2013 0000   AST 29 11/28/2021 0000   ALT 31 (H) 11/28/2021 0000   ALKPHOS 83 08/08/2021 1106   BILITOT 0.2 11/28/2021 0000   BILITOT 0.3 08/08/2021 1106   GFRNONAA 109 04/05/2020 0711   GFRAA 126 04/05/2020 0711   Lab Results  Component Value Date   CHOL 205 (H) 02/20/2021   HDL 92 02/20/2021   LDLCALC 100 (H) 02/20/2021   TRIG 45 02/20/2021   CHOLHDL 2.2 02/20/2021   Lab Results  Component Value Date   HGBA1C 4.9 02/20/2021   Lab Results  Component Value Date   VITAMINB12 962 02/20/2021   Lab Results  Component Value Date   TSH 1.11 11/28/2021    Head CT 08/21/2022 Mild left lateral posterior parietal scalp soft tissue swelling  with subcutaneous emphysema without underlying calvarial fracture.  No acute intracranial abnormality.    ASSESSMENT AND PLAN 43 y.o. year old female  has a past medical history of Alcohol abuse, Anxiety, Contusion, and Seizure disorder (HCC) (12/28/2018). here with:  1.  Epilepsy 2.  Depression  3.  Anxiety  Continue Lamictal  XR 400 mg daily Continue with Venlafaxine  and Fluoxetine . Will refer patient to both psychiatry and psychology.   Continue with Zonisamide   400 mg nightly Follow-up in 6 months or sooner if needed   Virtual Visit via Video Note  I connected with  Deanna Keller on 10/21/23 at  1:45 PM EDT by a video enabled telemedicine application and verified that I am speaking with the correct person using two identifiers.  Location: Patient: Home Provider: [facility] Office   I discussed the limitations of evaluation and management by telemedicine and the availability of in person appointments. The patient expressed understanding and agreed to proceed.   I discussed the assessment and treatment plan with the patient. The patient was provided an opportunity to ask questions and all were answered. The patient agreed with the plan and demonstrated an understanding of the instructions.   The patient was advised to call back or seek an in-person evaluation if the symptoms worsen or if the condition fails to improve as anticipated.  I provided 15 minutes of non-face-to-face time during this encounter.   I personally spent a total of 30 minutes in the care of the patient today including preparing to see the patient, getting/reviewing separately obtained history, performing a medically appropriate exam/evaluation, counseling and educating, placing orders, referring and communicating with other health care professionals, and documenting clinical information in the EHR.   Pastor Falling, MD 10/21/2023, 9:29 PM [facility] [address] [place], [place]  [address] [phone]

## 2023-10-21 NOTE — Patient Instructions (Signed)
 Continue Lamictal  XR 400 mg daily Continue with Venlafaxine  and Fluoxetine . Will refer patient to both psychiatry and psychology.   Continue with Zonisamide  400 mg nightly Follow-up in 6 months or sooner if needed

## 2023-10-22 ENCOUNTER — Telehealth: Payer: Self-pay | Admitting: Neurology

## 2023-10-22 NOTE — Telephone Encounter (Signed)
 Referral for psychiatry fax to [facility]. Phone: [phone], Fax: [phone]

## 2023-10-22 NOTE — Telephone Encounter (Signed)
 Referral for psychology fax to [facility]. Phone:  [phone], Fax: [phone]

## 2023-10-28 NOTE — Telephone Encounter (Signed)
 Received Fax from [facility] stating that  Pt  states she would call back to schedule appt .  [facility] will notify office once Pt call to schedule appt.

## 2023-11-05 ENCOUNTER — Encounter: Payer: Self-pay | Admitting: Neurology

## 2023-11-07 DIAGNOSIS — Z9884 Bariatric surgery status: Secondary | ICD-10-CM | POA: Diagnosis not present

## 2023-11-07 DIAGNOSIS — K912 Postsurgical malabsorption, not elsewhere classified: Secondary | ICD-10-CM | POA: Diagnosis not present

## 2023-11-12 ENCOUNTER — Other Ambulatory Visit: Payer: Self-pay | Admitting: Physician Assistant

## 2023-11-12 ENCOUNTER — Encounter: Payer: Self-pay | Admitting: Physician Assistant

## 2023-11-12 DIAGNOSIS — R569 Unspecified convulsions: Secondary | ICD-10-CM

## 2023-11-12 DIAGNOSIS — F411 Generalized anxiety disorder: Secondary | ICD-10-CM

## 2023-11-12 MED ORDER — ALPRAZOLAM 1 MG PO TABS: 1.0000 mg | ORAL_TABLET | Freq: Three times a day (TID) | ORAL | 0 refills | Status: DC | PRN

## 2023-11-12 NOTE — Telephone Encounter (Signed)
 Last filled 07/16/2023  Last Video Visit 05/27/2023  No upcoming appointment

## 2023-11-12 NOTE — Telephone Encounter (Signed)
 Sent!

## 2023-11-12 NOTE — Telephone Encounter (Signed)
 Last filled 07/16/2023  Last Video Visit 08/04/2023  No upcoming appointment

## 2023-11-12 NOTE — Telephone Encounter (Signed)
 Hi Jade, we had this patient scheduled follow-up with you as she was overdue.  You may really want to consider trying to taper down her 3 times daily benzo use it says as needed but she is literally filling it every 30 days.  We consider adjusting her venlafaxine .

## 2023-11-12 NOTE — Telephone Encounter (Signed)
 Last fill 07/16/23 last visit 05/27/23 no upcoming visit

## 2023-11-12 NOTE — Telephone Encounter (Signed)
  Please schedule f/u with Deanna Keller. She was due to return in April

## 2023-11-12 NOTE — Telephone Encounter (Signed)
 Please schedule f/u with Deanna Keller. She was due to return in April

## 2023-11-15 ENCOUNTER — Other Ambulatory Visit: Payer: Self-pay | Admitting: Neurology

## 2023-11-15 DIAGNOSIS — G43011 Migraine without aura, intractable, with status migrainosus: Secondary | ICD-10-CM

## 2023-11-17 NOTE — Telephone Encounter (Signed)
 That was the dose [facility] for and I just continued but she has been referred to another psychiatrist by neurology. She has a hx of having withdrawal seizures when she decreases xanax  dose.

## 2023-11-20 ENCOUNTER — Telehealth: Payer: Self-pay

## 2023-11-20 NOTE — Telephone Encounter (Signed)
 Left message requesting a call back for mammogram.

## 2023-11-24 ENCOUNTER — Other Ambulatory Visit: Payer: Self-pay | Admitting: Physician Assistant

## 2023-11-24 DIAGNOSIS — Z1231 Encounter for screening mammogram for malignant neoplasm of breast: Secondary | ICD-10-CM

## 2023-11-28 ENCOUNTER — Ambulatory Visit (INDEPENDENT_AMBULATORY_CARE_PROVIDER_SITE_OTHER): Admitting: Physician Assistant

## 2023-11-28 ENCOUNTER — Ambulatory Visit: Payer: Self-pay | Admitting: Physician Assistant

## 2023-11-28 VITALS — BP 123/68 | HR 66 | Ht 65.0 in | Wt 164.0 lb

## 2023-11-28 DIAGNOSIS — F331 Major depressive disorder, recurrent, moderate: Secondary | ICD-10-CM | POA: Diagnosis not present

## 2023-11-28 DIAGNOSIS — F411 Generalized anxiety disorder: Secondary | ICD-10-CM | POA: Diagnosis not present

## 2023-11-28 DIAGNOSIS — Z8639 Personal history of other endocrine, nutritional and metabolic disease: Secondary | ICD-10-CM

## 2023-11-28 DIAGNOSIS — R569 Unspecified convulsions: Secondary | ICD-10-CM | POA: Diagnosis not present

## 2023-11-28 DIAGNOSIS — E663 Overweight: Secondary | ICD-10-CM

## 2023-11-28 DIAGNOSIS — T148XXA Other injury of unspecified body region, initial encounter: Secondary | ICD-10-CM | POA: Diagnosis not present

## 2023-11-28 DIAGNOSIS — R8290 Unspecified abnormal findings in urine: Secondary | ICD-10-CM | POA: Diagnosis not present

## 2023-11-28 DIAGNOSIS — W19XXXD Unspecified fall, subsequent encounter: Secondary | ICD-10-CM

## 2023-11-28 DIAGNOSIS — N898 Other specified noninflammatory disorders of vagina: Secondary | ICD-10-CM | POA: Diagnosis not present

## 2023-11-28 DIAGNOSIS — G43011 Migraine without aura, intractable, with status migrainosus: Secondary | ICD-10-CM

## 2023-11-28 LAB — POCT URINALYSIS DIP (CLINITEK)
Bilirubin, UA: NEGATIVE
Blood, UA: NEGATIVE
Glucose, UA: NEGATIVE mg/dL
Ketones, POC UA: NEGATIVE mg/dL
Nitrite, UA: NEGATIVE
POC PROTEIN,UA: NEGATIVE
Spec Grav, UA: 1.015 (ref 1.010–1.025)
Urobilinogen, UA: 1 U/dL
pH, UA: 6.5 (ref 5.0–8.0)

## 2023-11-28 MED ORDER — ALPRAZOLAM 1 MG PO TABS: 1.0000 mg | ORAL_TABLET | Freq: Three times a day (TID) | ORAL | 5 refills | Status: DC | PRN

## 2023-11-28 MED ORDER — LAMOTRIGINE ER 200 MG PO TB24: 2.0000 | ORAL_TABLET | Freq: Every day | ORAL | 0 refills | Status: DC

## 2023-11-28 MED ORDER — FLUOXETINE HCL 10 MG PO CAPS: 10.0000 mg | ORAL_CAPSULE | Freq: Every day | ORAL | 1 refills | Status: AC

## 2023-11-28 MED ORDER — FLUCONAZOLE 150 MG PO TABS: ORAL_TABLET | ORAL | 0 refills | Status: DC

## 2023-11-28 NOTE — Patient Instructions (Addendum)
 Diflucan  for vaginal discharge Refilled medication today Referral to Dr. Maurice If hematoma does not resolve in next 4 weeks with massage/compression/heat  Hematoma A hematoma is a collection of blood under the skin, in an organ, in a body space, in a joint space, or in other tissue. The blood can thicken (clot) to form a lump that you can see and feel. The lump is often firm and may become sore and tender. Most hematomas get better in a few days to weeks. However, some hematomas may be serious and require medical care. Hematomas can range from very small to very large. What are the causes? This condition is caused by: A blunt or penetrating injury. Leakage from a blood vessel under the skin. Some medical procedures, including surgeries, such as oral surgery, face lifts, and surgeries on the joints. Some medical conditions that cause bleeding or bruising. There may be multiple hematomas that appear in different areas of the body. What increases the risk? You are more likely to develop this condition if: You are an older adult. You use blood thinners. You regularly use NSAIDs, such as ibuprofen, for pain. You play contact sports. What are the signs or symptoms?  Symptoms of this condition depend on where the hematoma is located.  Common symptoms of a hematoma that is under the skin include: A firm lump on the body. Pain and tenderness in the area. Bruising. Blue, dark blue, purple-red, or yellowish skin (discoloration) may appear at the site of the hematoma if the hematoma is close to the surface of the skin. Common symptoms of a hematoma that is deep in the tissues or body spaces may be less obvious. They include: A collection of blood in the stomach (intra-abdominal hematoma). This may cause pain in the abdomen, weakness, fainting, and shortness of breath. A collection of blood in the head (intracranial hematoma). This may cause a headache or symptoms such as weakness, trouble speaking or  understanding, or a change in consciousness. How is this diagnosed? This condition is diagnosed based on: Your medical history. A physical exam. Imaging tests, such as an ultrasound or CT scan. These may be needed if your health care provider suspects a hematoma in deeper tissues or body spaces. Blood tests. These may be needed if your health care provider believes that the hematoma is caused by a medical condition. How is this treated? Treatment for this condition depends on the cause, size, and location of the hematoma. Treatment may include: Doing nothing. The majority of hematomas do not need treatment as many of them go away on their own. Surgery or close monitoring. This may be needed for large hematomas or hematomas that affect vital organs. Medicines. Medicines may be given if there is an underlying medical cause for the hematoma. Follow these instructions at home: Managing pain, stiffness, and swelling  If directed, put ice on the injured area. To do this: Put ice in a plastic bag. Place a towel between your skin and the bag. Leave the ice on for 20 minutes, 2-3 times a day for the first couple of days. If your skin turns bright red, remove the ice right away to prevent skin damage. The risk of skin damage is higher if you cannot feel pain, heat, or cold. If directed, apply heat to the affected area as often as told by your health care provider. Use the heat source that your health care provider recommends, such as a moist heat pack or a heating pad. Place a towel between  your skin and the heat source. Leave the heat on for 20-30 minutes. If your skin turns bright red, remove the heat right away to prevent burns. The risk of burns is higher if you cannot feel pain, heat, or cold. Raise (elevate) the injured area above the level of your heart while you are sitting or lying down. If directed, wrap the affected area with an elastic bandage. The bandage applies pressure (compression) to  the area, which may help to reduce swelling and promote healing. Do not wrap the bandage too tightly around the affected area. If your hematoma is on a leg or foot (lower extremity) and is painful, your health care provider may recommend crutches. Use them as told by your health care provider. General instructions Take over-the-counter and prescription medicines only as told by your health care provider. Rest the injured area as directed by your health care provider. Keep all follow-up visits. Your health care provider may want to see how your hematoma is progressing with treatment. Contact a health care provider if: You have a fever. The swelling or discoloration gets worse. You develop more hematomas. Your pain is worse or your pain is not controlled with medicine. Your skin over the hematoma breaks or starts bleeding. Get help right away if: Your hematoma is in your chest or abdomen and you have weakness, shortness of breath, or a change in consciousness. You have a hematoma on your scalp that is caused by a fall or injury, and you also have: A headache that gets worse. Trouble speaking or understanding speech. Weakness. A change in alertness or consciousness. These symptoms may be an emergency. Get help right away. Call 911. Do not wait to see if the symptoms will go away. Do not drive yourself to the hospital. This information is not intended to replace advice given to you by your health care provider. Make sure you discuss any questions you have with your health care provider. Document Revised: 09/24/2021 Document Reviewed: 09/24/2021 Elsevier Patient Education  2024 [employer].

## 2023-11-28 NOTE — Progress Notes (Signed)
 Will call patient with culture.

## 2023-11-30 LAB — URINE CULTURE

## 2023-12-01 DIAGNOSIS — Z01419 Encounter for gynecological examination (general) (routine) without abnormal findings: Secondary | ICD-10-CM | POA: Diagnosis not present

## 2023-12-01 NOTE — Progress Notes (Signed)
No significant bacteria found in urine culture.

## 2023-12-05 DIAGNOSIS — E663 Overweight: Secondary | ICD-10-CM | POA: Insufficient documentation

## 2023-12-05 MED ORDER — TIRZEPATIDE 10 MG/0.5ML ~~LOC~~ SOAJ
SUBCUTANEOUS | Status: AC
Start: 2023-12-05 — End: ?

## 2023-12-05 NOTE — Progress Notes (Signed)
 Established Patient Office Visit  Subjective   Patient ID: Deanna Keller, female    DOB: May 15, 1980  Age: 43 y.o. MRN: [id number]  Chief Complaint  Patient presents with   Medical Management of Chronic Issues    Med refill    HPI Pt is a 43 yo female who presents to the clinic for medication refills and to discuss a few concerns.   Her migraines are controlled. She very rarely has any migraines. She has a few fioricet to take as needed.   She did not go the psychiatrist that neurology referred her to because the co-pay was 150dollars and she cannot afford that. She would like to see if anything [facility] is that much. She is down to 1/2 tablet of prozac  with effexor . She still struggles with depression and anxiety. She still takes xanax  1mg  TID.   She fell downstairs on July 29th. She did not break anything but bruised her right anterior leg really bad. She stills as a tender knot below her right knee and would like it looked at today.   Pt is having intermittent vaginal discharge. She has appt with GYN on Monday but would like something to help with discharge today. She just finished metronidazole but does not seem to be helping. No odor. No dysuria, abdominal pain or flank pain. No fever, chills, body aches.   She continues to work on weight loss with compounded Tir[facility]ide at [facility]. She is doing well with no concerns. Her BMI is 27 with goal of 25. She feels great.      ROS See HPI.    Objective:     BP 123/68   Pulse 66   Ht 5' 5 (1.651 m)   Wt 164 lb (74.4 kg)   SpO2 99%   BMI 27.29 kg/m  BP Readings from Last 3 Encounters:  11/28/23 123/68  01/23/23 102/61  09/20/22 103/70   Wt Readings from Last 3 Encounters:  11/28/23 164 lb (74.4 kg)  05/27/23 163 lb (73.9 kg)  01/23/23 167 lb (75.8 kg)      Physical Exam Constitutional:      Appearance: Normal appearance.  HENT:     Head: Normocephalic.  Cardiovascular:     Rate and Rhythm: Normal rate  and regular rhythm.  Pulmonary:     Effort: Pulmonary effort is normal.     Breath sounds: Normal breath sounds.  Skin:    Comments: 3cm by 1cm hematoma just below knee of right leg. Firm and a little tender to palpation. No redness or warmth.   Neurological:     General: No focal deficit present.     Mental Status: She is alert and oriented to person, place, and time.  Psychiatric:        Mood and Affect: Mood normal.      Results for orders placed or performed in visit on 11/28/23  Urine Culture   Specimen: Urine   Urine  Result Value Ref Range   Urine Culture, Routine Final report    Organism ID, Bacteria Comment   POCT URINALYSIS DIP (CLINITEK)  Result Value Ref Range   Color, UA yellow yellow   Clarity, UA clear clear   Glucose, UA negative negative mg/dL   Bilirubin, UA negative negative   Ketones, POC UA negative negative mg/dL   Spec Grav, UA 8.984 8.989 - 1.025   Blood, UA negative negative   pH, UA 6.5 5.0 - 8.0   POC PROTEIN,UA negative negative, trace  Urobilinogen, UA 1.0 0.2 or 1.0 E.U./dL   Nitrite, UA Negative Negative   Leukocytes, UA Trace (A) Negative     The 10-year ASCVD risk score (Arnett DK, et al., 2019) is: 0.2%    Assessment & Plan:  SABRASABRACherylanne was seen today for medical management of chronic issues.  Diagnoses and all orders for this visit:  Overweight (BMI 25.0-29.9) -     tirzepatide  (MOUNJARO ) 10 MG/0.5ML Pen; Liposlim.  Tirzepatide /Pyridoxine/Thiamine /L-Carnitine 10mg /mL.  Inject 2.5 mg/25 units subcu weekly for 4 weeks then 5 mg/50 units subcu weekly for 4 weeks then 7.5 mg/75 units subcu weekly for 4 weeks then 10 mg/100 units subcu weekly for 4 weeks then 15 mg/150 units subcu weekly  Generalized anxiety disorder -     ALPRAZolam  (XANAX ) 1 MG tablet; Take 1 tablet (1 mg total) by mouth 3 (three) times daily as needed for anxiety. -     FLUoxetine  (PROZAC ) 10 MG capsule; Take 1 capsule (10 mg total) by mouth daily. -     Ambulatory  referral to Psychiatry  Moderate episode of recurrent major depressive disorder (HCC) -     FLUoxetine  (PROZAC ) 10 MG capsule; Take 1 capsule (10 mg total) by mouth daily. -     Ambulatory referral to Psychiatry  Seizure [facility]) -     ALPRAZolam  (XANAX ) 1 MG tablet; Take 1 tablet (1 mg total) by mouth 3 (three) times daily as needed for anxiety. -     LamoTRIgine  200 MG TB24 24 hour tablet; Take 2 tablets (400 mg total) by mouth daily.  Vaginal discharge -     fluconazole  (DIFLUCAN ) 150 MG tablet; Take one tablet now and then one tablet in 48 hours.  Fall, subsequent encounter  Hematoma -     POCT URINALYSIS DIP (CLINITEK) -     Urine Culture  History of obesity -     tirzepatide  (MOUNJARO ) 10 MG/0.5ML Pen; Liposlim.  Tirzepatide /Pyridoxine/Thiamine /L-Carnitine 10mg /mL.  Inject 2.5 mg/25 units subcu weekly for 4 weeks then 5 mg/50 units subcu weekly for 4 weeks then 7.5 mg/75 units subcu weekly for 4 weeks then 10 mg/100 units subcu weekly for 4 weeks then 15 mg/150 units subcu weekly  Intractable migraine without aura and with status migrainosus   If treated for BV and still has discharge likely yeast Sent diflucan  to pharmacy Follow up with GYN UA positive for small leuks but with no other UTI symptoms will hold on treatment and culture. Will treat accordingly.  Refilled xanax /lamictal /prozac  Made new referral for Dr. Maurice for [facility].  Discussed hematoma and treatment HO given if not resolving may need to send to general surgery for removal. Pt is doing great with weight loss on compounded tirzepatide  on 150 units weekly currently Goal BMI is under 25 Continue with healthy diet, protein and regular exercise   Return in about 6 months (around 05/30/2024).    Vearl Aitken, PA-C

## 2023-12-10 ENCOUNTER — Encounter

## 2023-12-10 DIAGNOSIS — Z1231 Encounter for screening mammogram for malignant neoplasm of breast: Secondary | ICD-10-CM

## 2023-12-11 ENCOUNTER — Ambulatory Visit (INDEPENDENT_AMBULATORY_CARE_PROVIDER_SITE_OTHER)

## 2023-12-11 DIAGNOSIS — Z1231 Encounter for screening mammogram for malignant neoplasm of breast: Secondary | ICD-10-CM | POA: Diagnosis not present

## 2023-12-16 ENCOUNTER — Encounter: Payer: Self-pay | Admitting: Sports Medicine

## 2023-12-16 ENCOUNTER — Ambulatory Visit: Payer: Self-pay | Admitting: Physician Assistant

## 2023-12-16 NOTE — Progress Notes (Signed)
 Normal mammogram. Follow up in 1 year.

## 2023-12-22 ENCOUNTER — Encounter: Payer: Self-pay | Admitting: Physician Assistant

## 2023-12-22 DIAGNOSIS — N898 Other specified noninflammatory disorders of vagina: Secondary | ICD-10-CM

## 2023-12-22 MED ORDER — FLUCONAZOLE 150 MG PO TABS: ORAL_TABLET | ORAL | 0 refills | Status: AC

## 2023-12-24 DIAGNOSIS — D500 Iron deficiency anemia secondary to blood loss (chronic): Secondary | ICD-10-CM | POA: Diagnosis not present

## 2023-12-24 DIAGNOSIS — R5383 Other fatigue: Secondary | ICD-10-CM | POA: Diagnosis not present

## 2023-12-24 DIAGNOSIS — N920 Excessive and frequent menstruation with regular cycle: Secondary | ICD-10-CM | POA: Diagnosis not present

## 2023-12-31 DIAGNOSIS — Z9884 Bariatric surgery status: Secondary | ICD-10-CM | POA: Diagnosis not present

## 2023-12-31 DIAGNOSIS — E875 Hyperkalemia: Secondary | ICD-10-CM | POA: Diagnosis not present

## 2024-01-09 DIAGNOSIS — D509 Iron deficiency anemia, unspecified: Secondary | ICD-10-CM | POA: Diagnosis not present

## 2024-01-16 DIAGNOSIS — D509 Iron deficiency anemia, unspecified: Secondary | ICD-10-CM | POA: Diagnosis not present

## 2024-01-23 DIAGNOSIS — R8290 Unspecified abnormal findings in urine: Secondary | ICD-10-CM | POA: Diagnosis not present

## 2024-01-23 DIAGNOSIS — Z113 Encounter for screening for infections with a predominantly sexual mode of transmission: Secondary | ICD-10-CM | POA: Diagnosis not present

## 2024-01-26 DIAGNOSIS — D509 Iron deficiency anemia, unspecified: Secondary | ICD-10-CM | POA: Diagnosis not present

## 2024-03-26 DIAGNOSIS — N920 Excessive and frequent menstruation with regular cycle: Secondary | ICD-10-CM | POA: Diagnosis not present

## 2024-03-26 DIAGNOSIS — Z9884 Bariatric surgery status: Secondary | ICD-10-CM | POA: Diagnosis not present

## 2024-03-26 DIAGNOSIS — R5383 Other fatigue: Secondary | ICD-10-CM | POA: Diagnosis not present

## 2024-03-26 DIAGNOSIS — D500 Iron deficiency anemia secondary to blood loss (chronic): Secondary | ICD-10-CM | POA: Diagnosis not present

## 2024-04-04 ENCOUNTER — Other Ambulatory Visit: Payer: Self-pay | Admitting: Physician Assistant

## 2024-04-10 ENCOUNTER — Other Ambulatory Visit: Payer: Self-pay | Admitting: Physician Assistant

## 2024-04-10 DIAGNOSIS — F331 Major depressive disorder, recurrent, moderate: Secondary | ICD-10-CM

## 2024-04-10 DIAGNOSIS — F411 Generalized anxiety disorder: Secondary | ICD-10-CM

## 2024-04-11 ENCOUNTER — Other Ambulatory Visit: Payer: Self-pay | Admitting: Physician Assistant

## 2024-04-11 DIAGNOSIS — R569 Unspecified convulsions: Secondary | ICD-10-CM

## 2024-04-14 DIAGNOSIS — F339 Major depressive disorder, recurrent, unspecified: Secondary | ICD-10-CM | POA: Diagnosis not present

## 2024-04-14 DIAGNOSIS — F411 Generalized anxiety disorder: Secondary | ICD-10-CM | POA: Diagnosis not present

## 2024-04-14 DIAGNOSIS — F410 Panic disorder [episodic paroxysmal anxiety] without agoraphobia: Secondary | ICD-10-CM | POA: Diagnosis not present

## 2024-05-12 ENCOUNTER — Encounter: Payer: Self-pay | Admitting: Physician Assistant

## 2024-05-12 DIAGNOSIS — F411 Generalized anxiety disorder: Secondary | ICD-10-CM

## 2024-05-12 DIAGNOSIS — R569 Unspecified convulsions: Secondary | ICD-10-CM

## 2024-05-12 NOTE — Telephone Encounter (Signed)
 Can we call pharmacy and see if they will refill early? Does she have refills? She should?

## 2024-05-12 NOTE — Telephone Encounter (Signed)
 Patient picked up 90 tablets on 1/4. She will be due for next refill on 2/3.

## 2024-05-13 NOTE — Addendum Note (Signed)
 Addended by: BONNY JON DEL on: 05/13/2024 07:11 AM   Modules accepted: Orders

## 2024-05-14 MED ORDER — ALPRAZOLAM 1 MG PO TABS: 1.0000 mg | ORAL_TABLET | Freq: Three times a day (TID) | ORAL | 1 refills | Status: AC | PRN

## 2024-05-14 NOTE — Addendum Note (Signed)
 Addended by: ANTONIETTE VERMELL CROME on: 05/14/2024 12:41 PM   Modules accepted: Orders

## 2024-06-09 ENCOUNTER — Ambulatory Visit: Admitting: Neurology
